# Patient Record
Sex: Male | Born: 1945
Health system: Southern US, Community
[De-identification: ages and names within clinical notes are randomized; demographics above are authoritative.]

## PROBLEM LIST (undated history)

## (undated) DIAGNOSIS — K219 Gastro-esophageal reflux disease without esophagitis: Secondary | ICD-10-CM

## (undated) DIAGNOSIS — K5792 Diverticulitis of intestine, part unspecified, without perforation or abscess without bleeding: Secondary | ICD-10-CM

## (undated) DIAGNOSIS — Z974 Presence of external hearing-aid: Secondary | ICD-10-CM

## (undated) DIAGNOSIS — Z87442 Personal history of urinary calculi: Secondary | ICD-10-CM

## (undated) DIAGNOSIS — N200 Calculus of kidney: Secondary | ICD-10-CM

## (undated) DIAGNOSIS — S42009A Fracture of unspecified part of unspecified clavicle, initial encounter for closed fracture: Secondary | ICD-10-CM

## (undated) DIAGNOSIS — R319 Hematuria, unspecified: Secondary | ICD-10-CM

## (undated) DIAGNOSIS — H9190 Unspecified hearing loss, unspecified ear: Secondary | ICD-10-CM

## (undated) DIAGNOSIS — M1990 Unspecified osteoarthritis, unspecified site: Secondary | ICD-10-CM

## (undated) DIAGNOSIS — IMO0002 Reserved for concepts with insufficient information to code with codable children: Secondary | ICD-10-CM

## (undated) HISTORY — PX: LUMBAR SPINE SURGERY: SHX701

## (undated) HISTORY — PX: EXTRACORPOREAL SHOCK WAVE LITHOTRIPSY: SHX1557

## (undated) HISTORY — PX: ROTATOR CUFF REPAIR: SHX139

## (undated) HISTORY — PX: CARPAL TUNNEL RELEASE: SHX101

---
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
## 1968-03-17 HISTORY — PX: EXPLORATORY LAPAROTOMY: SUR591

## 1981-03-17 HISTORY — PX: CARPAL TUNNEL RELEASE: SHX101

## 1982-03-17 HISTORY — PX: CARPAL TUNNEL RELEASE: SHX101

## 1984-03-17 HISTORY — PX: LITHOTRIPSY: SUR834

## 1986-03-17 HISTORY — PX: ANKLE SURGERY: SHX546

## 1993-03-17 HISTORY — PX: BACK SURGERY: SHX140

## 1995-03-18 HISTORY — PX: ROTATOR CUFF REPAIR: SHX139

## 1997-09-13 ENCOUNTER — Ambulatory Visit (HOSPITAL_COMMUNITY): Admission: RE | Admit: 1997-09-13 | Discharge: 1997-09-13 | Payer: Self-pay | Admitting: Internal Medicine

## 1999-03-18 HISTORY — PX: LITHOTRIPSY: SUR834

## 2000-03-17 DIAGNOSIS — Z8719 Personal history of other diseases of the digestive system: Secondary | ICD-10-CM

## 2000-03-17 HISTORY — DX: Personal history of other diseases of the digestive system: Z87.19

## 2000-03-17 HISTORY — PX: OTHER SURGICAL HISTORY: SHX169

## 2000-09-15 ENCOUNTER — Encounter: Payer: Self-pay | Admitting: Emergency Medicine

## 2000-09-15 ENCOUNTER — Inpatient Hospital Stay (HOSPITAL_COMMUNITY): Admission: EM | Admit: 2000-09-15 | Discharge: 2000-09-19 | Payer: Self-pay | Admitting: Emergency Medicine

## 2000-09-18 ENCOUNTER — Encounter: Payer: Self-pay | Admitting: Internal Medicine

## 2000-10-09 ENCOUNTER — Encounter: Admission: RE | Admit: 2000-10-09 | Discharge: 2000-10-09 | Payer: Self-pay | Admitting: Surgery

## 2000-10-09 ENCOUNTER — Encounter: Payer: Self-pay | Admitting: Surgery

## 2000-11-11 ENCOUNTER — Encounter: Payer: Self-pay | Admitting: Urology

## 2000-11-12 ENCOUNTER — Ambulatory Visit (HOSPITAL_COMMUNITY): Admission: RE | Admit: 2000-11-12 | Discharge: 2000-11-12 | Payer: Self-pay | Admitting: Urology

## 2000-11-12 ENCOUNTER — Encounter: Payer: Self-pay | Admitting: Urology

## 2001-10-01 ENCOUNTER — Encounter: Admission: RE | Admit: 2001-10-01 | Discharge: 2001-10-01 | Payer: Self-pay | Admitting: *Deleted

## 2001-10-01 ENCOUNTER — Encounter: Payer: Self-pay | Admitting: *Deleted

## 2001-10-07 ENCOUNTER — Encounter: Payer: Self-pay | Admitting: *Deleted

## 2001-10-07 ENCOUNTER — Encounter: Admission: RE | Admit: 2001-10-07 | Discharge: 2001-10-07 | Payer: Self-pay | Admitting: *Deleted

## 2001-11-18 ENCOUNTER — Ambulatory Visit (HOSPITAL_BASED_OUTPATIENT_CLINIC_OR_DEPARTMENT_OTHER): Admission: RE | Admit: 2001-11-18 | Discharge: 2001-11-18 | Payer: Self-pay | Admitting: Urology

## 2001-11-18 ENCOUNTER — Encounter: Payer: Self-pay | Admitting: Urology

## 2002-11-12 ENCOUNTER — Ambulatory Visit: Admission: RE | Admit: 2002-11-12 | Discharge: 2002-11-12 | Payer: Self-pay

## 2003-03-18 HISTORY — PX: LITHOTRIPSY: SUR834

## 2003-06-29 ENCOUNTER — Ambulatory Visit (HOSPITAL_COMMUNITY): Admission: RE | Admit: 2003-06-29 | Discharge: 2003-06-29 | Payer: Self-pay | Admitting: Gastroenterology

## 2004-07-08 ENCOUNTER — Encounter: Admission: RE | Admit: 2004-07-08 | Discharge: 2004-07-08 | Payer: Self-pay | Admitting: Family Medicine

## 2004-07-11 ENCOUNTER — Ambulatory Visit (HOSPITAL_COMMUNITY): Admission: RE | Admit: 2004-07-11 | Discharge: 2004-07-11 | Payer: Self-pay | Admitting: *Deleted

## 2004-07-15 ENCOUNTER — Ambulatory Visit (HOSPITAL_COMMUNITY): Admission: RE | Admit: 2004-07-15 | Discharge: 2004-07-15 | Payer: Self-pay | Admitting: *Deleted

## 2004-07-17 ENCOUNTER — Ambulatory Visit (HOSPITAL_COMMUNITY): Admission: RE | Admit: 2004-07-17 | Discharge: 2004-07-17 | Payer: Self-pay | Admitting: *Deleted

## 2004-07-18 ENCOUNTER — Ambulatory Visit (HOSPITAL_COMMUNITY): Admission: RE | Admit: 2004-07-18 | Discharge: 2004-07-18 | Payer: Self-pay | Admitting: *Deleted

## 2004-07-22 ENCOUNTER — Ambulatory Visit (HOSPITAL_COMMUNITY): Admission: RE | Admit: 2004-07-22 | Discharge: 2004-07-22 | Payer: Self-pay | Admitting: *Deleted

## 2004-07-23 ENCOUNTER — Ambulatory Visit: Payer: Self-pay | Admitting: Internal Medicine

## 2004-08-21 ENCOUNTER — Ambulatory Visit: Payer: Self-pay | Admitting: Internal Medicine

## 2005-02-04 ENCOUNTER — Encounter: Admission: RE | Admit: 2005-02-04 | Discharge: 2005-02-04 | Payer: Self-pay | Admitting: Orthopedic Surgery

## 2006-03-17 HISTORY — PX: ANKLE SURGERY: SHX546

## 2006-03-17 HISTORY — PX: LITHOTRIPSY: SUR834

## 2006-10-15 ENCOUNTER — Ambulatory Visit (HOSPITAL_COMMUNITY): Admission: RE | Admit: 2006-10-15 | Discharge: 2006-10-15 | Payer: Self-pay | Admitting: Urology

## 2007-01-05 ENCOUNTER — Ambulatory Visit: Payer: Self-pay | Admitting: Critical Care Medicine

## 2007-02-02 ENCOUNTER — Ambulatory Visit (HOSPITAL_COMMUNITY): Admission: RE | Admit: 2007-02-02 | Discharge: 2007-02-02 | Payer: Self-pay | Admitting: Orthopedic Surgery

## 2007-03-05 ENCOUNTER — Ambulatory Visit: Payer: Self-pay | Admitting: Internal Medicine

## 2007-03-09 ENCOUNTER — Encounter: Payer: Self-pay | Admitting: Internal Medicine

## 2007-03-09 ENCOUNTER — Ambulatory Visit: Payer: Self-pay

## 2007-03-15 ENCOUNTER — Ambulatory Visit: Payer: Self-pay | Admitting: Internal Medicine

## 2007-03-15 ENCOUNTER — Ambulatory Visit (HOSPITAL_COMMUNITY): Admission: RE | Admit: 2007-03-15 | Discharge: 2007-03-15 | Payer: Self-pay | Admitting: Internal Medicine

## 2008-07-21 ENCOUNTER — Encounter: Admission: RE | Admit: 2008-07-21 | Discharge: 2008-07-21 | Payer: Self-pay | Admitting: Gastroenterology

## 2008-08-28 ENCOUNTER — Encounter: Admission: RE | Admit: 2008-08-28 | Discharge: 2008-08-28 | Payer: Self-pay | Admitting: Orthopedic Surgery

## 2008-08-31 ENCOUNTER — Encounter: Admission: RE | Admit: 2008-08-31 | Discharge: 2008-08-31 | Payer: Self-pay | Admitting: Orthopedic Surgery

## 2009-03-17 HISTORY — PX: OTHER SURGICAL HISTORY: SHX169

## 2010-01-03 ENCOUNTER — Encounter: Admission: RE | Admit: 2010-01-03 | Discharge: 2010-01-03 | Payer: Self-pay | Admitting: Gastroenterology

## 2010-07-30 NOTE — Assessment & Plan Note (Signed)
[facility]                             PULMONARY OFFICE NOTE   NAME:Andrew Moran, Andrew Moran                     MRN:          [id number]  DATE:01/05/2007                            DOB:          [birth year]    The patient is a 65 year old nonsmoker who presents for an evaluation  for asthma.  He reports asthma since childhood, but seemed to disappear  after the age of 2 and then resurfaced around the age of 44.  About 10  years ago, he developed severe reflux with worsening of his asthma  symptoms.  He saw Casimiro Needle B. Wert, MD, FCCP two years ago and was placed  on Advair 100/50 mg b.i.d. and Prevacid because it was felt that GERD  was contributing to his asthma.  He has been lost to follow-up since  then and states that he was doing well on low dose Advair and Prevacid.  Singulair was discontinued.  About two months ago, he developed sinus  infection and was treated with Septra.  Since then he reports increasing  use of his albuterol in spite of being on the same dose of Advair 100/50  one puff b.i.d.   He reports GERD for about 20 years requiring Tagamet, worsening with  sleep.  Over the last few years, he has been on Prevacid and does not  report any breakthrough symptoms on this.   PAST MEDICAL HISTORY:  Chronic back pain, renal calculi.   CURRENT MEDICATIONS:  1. Aspirin 81 mg daily.  2. Advair 100/50 one puff b.i.d.  3. Prevacid 20 mg daily.  4. Vytorin.  5. Hydrochlorothiazide 25 mg daily.  6. Potassium 40 mEq daily.  7. Glucosamine.  8. Albuterol MDI p.r.n.  9. Methocarbamol 500 mg daily.  10.Hydrocodone 10/650 mg daily.  11.Robitussin p.r.n.  12.NyQuil as needed.   PHYSICAL EXAMINATION:  VITAL SIGNS:  Weight 180 pounds, blood pressure  104/70, heart rate 68, oxygen saturation 96% on room air.  HEENT:  No postnasal drip.  CARDIOVASCULAR:  S1 and S2 normal.  CHEST:  Clear to auscultation.   ASSESSMENT:  1. Moderate persistent asthma.  2. Gastroesophageal reflux disease.  3. Recent attack of sinusitis.   His predominant symptom seems to cough.  I wonder if his sinusitis was  exacerbating this.  Certainly GERD, especially in non-acid reflux, could  also be a cause.   RECOMMENDATIONS:  1. I suggest that we undertake an empiric trial of Deconamine      sustained release one tablet b.i.d. (first generation antihistamine      and decongestant combination).  This would target sinusitis and      would decrease his cough if that were the cause.  2. I have given him a sample of Advair and we will increase his dose      to 250/50 one puff b.i.d.  3. He will return for a follow-up in about four weeks' time.  If his      symptoms persist, we will target GERD and intensify therapy for      GERD including non-acid reflux with Reglan.  Oretha Milch, MD  Electronically Signed    RVA/MedQ  DD: 01/05/2007  DT: 01/06/2007  Job #: [id number]

## 2010-07-30 NOTE — Assessment & Plan Note (Signed)
[facility]                            CARDIOLOGY OFFICE NOTE   NAME:Andrew Moran, Andrew Moran                     MRN:          [id number]  DATE:03/05/2007                            DOB:          10-02-1945    PRIMARY CARE PHYSICIAN:  L. Lupe Carney, M.D.   REASON FOR CONSULTATION:  Chest pain and dyspnea.   Mr. Fulop is a very pleasant 65 year old male with a history of  asthma, gastroesophageal reflux disease, kidney stones, hyperlipidemia,  and multiple orthopedic issues, who presents today for further  evaluation of chest pain and dyspnea.   He was having problems with chest pain back in 2006 and underwent  cardiac catheterization by Dr. Meade Maw.  This showed normal LV  function with just minimal coronary plaquing with a 20-30% LAD lesion  and a 15-20% right coronary lesion.   He tells me that he has chronic dyspnea, which perhaps is just slightly  worse recently.  He does have occasional wheezing, but this is not  frequent.  He has never been intubated for his asthma.  He also has  occasional chest pain, which he describes as a chest pressure.  This  happens about once a week.  This has been going on for a long time.  It  mainly occurs at night.  He does not have any exertional chest pain.  He  does get dyspneic with walking.  He has not had any heart failure  symptoms, no orthopnea, no PND, no lower extremity edema.   REVIEW OF SYSTEMS:  Notable for multiple allergies, gastroesophageal  reflux disease, arthritic pain, as well as orthopedic problems.  No  melena.  No bright red blood per rectum.  Occasional headaches.  The  remainder of the review of systems is negative except for HPI and  problem list.   PROBLEM LIST:  1. History of chest pain.      a.     Cardiac catheterization in April, 2006.  Minimal       nonobstructive coronary artery disease, normal LV function.  2. Asthma.  3. Hyperlipidemia.  4. Kidney stones.  5.  Gastroesophageal reflux disease.  6. Multiple orthopedic surgeries.   CURRENT MEDICATIONS:  1. Aspirin 81 a day.  2. Advair.  3. Multivitamin.  4. Vitamin E.  5. Vytorin.  6. Prevacid 30 a day.  7. HCTZ 25 a day.  8. Potassium.  9. Glucosamine.  10.Flax seed oil.  11.Saw palmetto.   ALLERGIES:  ZITHROMAX, which causes shortness of breath.   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  He denies any tobacco use.  Occasional alcohol.   FAMILY HISTORY:  Mother is alive at age 32.  She is well.  Father had  his first heart attack at 74 and had a fatal heart attack at 64.  One  sister, 84, and one brother, 38, do not have known coronary disease.   PHYSICAL EXAMINATION:  He is well-appearing in no acute distress.  He  ambulates around the clinic with a walker due to a plastic  boot on his  right lower leg.  No respiratory difficulty.  Blood pressure is 106/68, heart rate 68.  HEENT:  Normal.  NECK:  Supple.  No JVD.  Carotids are 2+ bilaterally without any bruits.  There is no lymphadenopathy or thyromegaly.  CARDIAC:  PMI is nondisplaced.  There is no lift or heave.  He has a  regular rate and rhythm.  No murmurs, rubs or gallops.  LUNGS:  Clear with no wheezing.  Mildly decreased air movement  throughout, though.  ABDOMEN:  Obese, nontender, nondistended.  No hepatosplenomegaly.  No  bruits.  No masses.  Good bowel sounds.  EXTREMITIES:  Warm with no clubbing, cyanosis or edema.  Good distal  pulses.  He has a boot on his right lower extremity due to recent ankle  surgery.  He has multiple orthopedic scars.  NEURO:  He is alert and oriented x3.  Cranial nerves II-XII are intact.  Moves all four extremities without difficulty.  Affect is very pleasant.   EKG shows a normal sinus rhythm at a rate of 85 with no ST-T wave  abnormalities.   ASSESSMENT/PLAN:  1. Chest pain:  Given his normal catheterization two years ago, I      think that the  chance of his chest pain being ischemic is quite      low, despite his strong family history.  We will proceed with a      cardiopulmonary exercise test to help rule out ischemia as well as      to further evaluate the cause of his dyspnea.  2. Dyspnea:  I suspect this is multifactorial.  I did discuss with him      that I felt a large part of this may be deconditioning, as he is      very sedentary; however, I think CPX testing will help [place] sort this      out somewhat better.   DISPOSITION:  We will see him back in 1-2 months.  We will let him know  the results of his test.  Of note, we will also order an echocardiogram  to make sure he has a structurally normal heart with normal pulmonary  pressures.     Bevelyn Buckles. Bensimhon, MD  Electronically Signed    DRB/MedQ  DD: 03/05/2007  DT: 03/05/2007  Job #: [id number]   cc:   Kerry Kass, M.D. [facility]  L. Lupe Carney, M.D.

## 2010-07-30 NOTE — Op Note (Signed)
NAMENEDIM, OKI NO.:  [id number]   MEDICAL RECORD NO.:  [id number]          PATIENT TYPE:  AMB   LOCATION:  DAY                          FACILITY:  [facility]   PHYSICIAN:  Marlowe Kays, M.D.  DATE OF BIRTH:  06/01/1945   DATE OF PROCEDURE:  02/02/2007  DATE OF DISCHARGE:                               OPERATIVE REPORT   PREOPERATIVE DIAGNOSIS:  Painful right ankle due to two retained screws  and reactive osteophytes.   POSTOPERATIVE DIAGNOSIS:  Painful right ankle due to retained screws.   SURGEON:  Marlowe Kays, M.D.   ASSISTANT:  Nurse.   OPERATION:  Removal of two screws from anterior medial ankle with ankle  arthrotomy and inspection of joint.   ANESTHESIA:  General.   PATHOLOGY AND JUSTIFICATION FOR PROCEDURE:  He has had progressive pain  and feeling of giving way of the right ankle recently with x-rays  demonstrating two screws in the medial malleolus and anterior medial  ankle with what appeared to be reactive osteophytes, particularly from  the distal tibia, impinging into the talus.  For this reason, he is here  today for removal of the hardware and osteophytes.   PROCEDURE:  Satisfactory general anesthesia, pneumatic tourniquet, the  right leg was esmarched out nonsterilely and prepped with DuraPrep from  midcalf to toes and draped in a sterile field.  Time-out performed.  I  made an anterior medial incision and using the C-arm was able to locate  the two screws, which I removed.  The anterior medial one at the joint  surface, did have some bleeding and I plugged the hole with bone wax.  I  then continued with an ankle arthrotomy and inspected the joint and  could not find the osteophytes that were noted on x-ray.  The joint  surfaces looked smooth and there was no impingement on dorsiflexion of  the ankle.  I used the C-arm to confirm both with lateral flexion-  extension x-rays that the osteophytes that appeared to be apparent with  the screws in place were not present after removal of the screws.  Accordingly no intra-articular bone work was indicated.  I irrigated the  wound well with sterile saline, closed the joint capsule with  interrupted #1 Vicryl as well as the more posterior screw hole.  Subcutaneous tissue was closed with 2-0 Vicryl, skin with interrupted 4-  0 nylon mattress sutures.  Betadine, Adaptic dry sterile dressing and a  well-padded short-leg splint cast was applied.  He tolerated the  procedure well and was taken to the recovery room in satisfactory  condition with no known complications.           ______________________________  Marlowe Kays, M.D.     JA/MEDQ  D:  02/02/2007  T:  02/02/2007  Job:  [id number]

## 2010-08-02 NOTE — Op Note (Signed)
NAME:  Andrew Moran, Andrew Moran                        ACCOUNT NO.:  [id number]   MEDICAL RECORD NO.:  [id number]                   PATIENT TYPE:  AMB   LOCATION:  ENDO                                 FACILITY:  [facility]   PHYSICIAN:  James L. Malon Kindle., M.D.          DATE OF BIRTH:  1945-08-28   DATE OF PROCEDURE:  06/29/2003  DATE OF DISCHARGE:                                 OPERATIVE REPORT   PROCEDURE:  Esophagogastroduodenoscopy.   MEDICATIONS GIVEN:  Cetacaine spray, Fentanyl 60 mcg, Versed 6 mg IV.   INDICATIONS FOR PROCEDURE:  Persistent esophageal reflux.   DESCRIPTION OF PROCEDURE:  The procedure was explained to the patient and  consent obtained.  With the patient in the left lateral decubitus position,  the Olympus scope was inserted and advanced.  The stomach was entered, the  pylorus identified and passed.  The duodenum including the bulb and second  portion, were unremarkable.  The scope was withdrawn.  The fundus and cardia  were seen and were normal as was the antrum and body of the stomach.  There  was no outlet obstruction.  There was a 2-3 cm hiatal hernia.  The GE  junction was widely patent.  There was no evidence of Barrett's esophagus.  The distal esophagus was endoscopically normal.  The scope was withdrawn.  The patient tolerated the procedure well.   ASSESSMENT:  Gastroesophageal reflux disease with a widely patent GE  junction, no evidence of Barrett's esophagus or gastric outlet obstruction,  530.81.   PLAN:  Will continue b.i.d. PPI, him give her a trial of Reglan, reflux  sheet, and see him back in the office in 6-8 weeks.                                               James L. Malon Kindle., M.D.    Waldron Session  D:  06/29/2003  T:  06/29/2003  Job:  [id number]   cc:   Domingo Pulse, M.D.

## 2010-08-02 NOTE — Discharge Summary (Signed)
[facility]  Patient:    Andrew Moran, Andrew Moran                     MRN: [id number] Adm. Date:  [id number] Disc. Date: [id number] Attending:  Duke Salvia                           Discharge Summary  DISCHARGE DIAGNOSES: 1. Diverticulitis. 2. Transient hypokalemia. 3. Mild anemia. 4. Allergies. 5. Lumbosacral spine degenerative disc disease.  PROCEDURES:  Abdominal CT x 2 with sigmoid diverticulitis, question a very tiny abscess, but more likely not and no perforation.  CONSULTS:  Dr. Jamey Ripa, general surgeon.  HISTORY AND PHYSICAL:  Please see the dictated date of admission, July 2, per Dr. Debby Bud.  HOSPITAL COURSE:  Andrew Moran is a __-year-old white male who was admitted with stabbing abdominal and rectal pain, admitted for diverticulitis based on examination and CAT scan for antibiotics and IV fluids. He was seen by Dr. Jamey Ripa who agreed with current management, clear liquid diet at that point. He was initially febrile and treated with IV Unasyn with the addition of Flagyl IV thereafter. White blood cell count was elevated to 15.7 and gradually defervesced ______ his fever. Clinically, he did well with conservative therapy with gradual decrease abdominal pain, returned to more normal bowel function, no nausea or vomiting, afebrile. White blood cell count normalized as well as nausea. There was no vomiting throughout. Followup CAT scan revealed no significant change by radiologic criteria from previous but he was clearly clinically improved. There was some minor drop in the hemoglobin to 11.1 but discharge hemoglobin was normalized at 12.3. White blood cell count 5.4. There was some transient hypokalemia but normal BMET at time of discharge as well. As he had minimal discomfort, ambulatory, eating well, and relatively normal bowel function, minimal left lower quadrant tenderness, normalized white blood cell count and afebrile, he was felt  to have gained maximum benefit from this hospitalization and is to be discharged.  DISPOSITION:  Discharge to home in good condition.  DISCHARGE MEDICATIONS: 1. Augmentin 875 mg x 10 days postdischarge. 2. Flagyl 250 mg p.o. q.i.d. for 10 days. 3. Continue own home regimen of Allegra, albuterol metered-dose inhaler, and    Pepcid.  DISCHARGE FOLLOWUP:  The patient is to follow up with Dr. Jamey Ripa in two weeks and Dr. Debby Bud on a p.r.n. basis. DD:  09/19/00 TD:  09/19/00 Job: [id number]

## 2010-08-02 NOTE — H&P (Signed)
[facility]  Patient:    Andrew Moran, Andrew Moran                     MRN: [id number] Adm. Date:  [id number] Attending:  Duke Salvia CC:         Gordy Savers, M.D. [facility]  Dr. Ethelene Hal; [facility]   History and Physical  CHIEF COMPLAINT:  Stabbing abdominal and rectal pain.  HISTORY OF PRESENT ILLNESS:  Mr. Andrew Moran is a 65 year old married white male with a history of dyspepsia, but no GI history otherwise.  He reports a two week history of abdominal pain that was brief in duration.  It would be sharp and lancinating in nature with sharp lancinating rectal pain as well.  The patient reports that this a.m. he had the onset of excrutiating knife life pain at the abdomen and rectum and lower back.  Patient reports he has been feeling hot at home, but no documented fever, no rigors, no night sweats.  He reports no change in his bowel habit except for possibly slightly darker stool.  He has had no diarrhea.  Of note, the patient has had epidural injection and disk injections within the last two weeks for a herniated nucleus pulposus which resulted in a right lower extremity radiculopathy.  The patient is now admitted for diverticulitis based on examination and CT scan for IV antibiotics.  PAST SURGICAL HISTORY:  Tonsillectomy, remote.  Patient had a repair of a ventral hernia in the 70s.  He had an operative repair, internal fixation of a right ankle fracture.  He has had rotator cuff repaired in both right and left shoulders.  He has had carpal tunnel repair in both right and left wrists.  He is status post L4-5 surgery in 1995.  Patient has had no other surgical history.  PAST MEDICAL HISTORY:  Usual childhood diseases, childhood asthma with long remission, ______ as an adult, allergies, herniated disk problem at L5-S1.  MEDICATIONS: 1. Allegra 60 mg b.i.d. 2. Albuterol metered dose inhaler two puffs q.i.d. p.r.n.  SOCIAL HISTORY:   Habits:  Tobacco:  None.  Alcohol:  Rare.  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 He also is a Furniture conservator/restorer.  He has been married for 37 years.  He has one son age 43 and two grandchildren.  His marriage is in good health.  ALLERGIES:  ZITHROMAX caused angioedema.  FAMILY HISTORY:  Negative for colon cancer, positive for coronary artery disease in his father who died at age 30, negative for diabetes or prostate cancer.  REVIEW OF SYSTEMS:  Patient is having a crown done.  He has dyspepsia as noted.  No pulmonary problems except as noted.  No cardiovascular problems. He does admit to mild pain in both thumbs suggestive of early OA.  PHYSICAL EXAMINATION  VITAL SIGNS:  Temperature 100.4 and patient is hot to touch, blood pressure 132/65, heart rate 75, respirations 15.  GENERAL:  This is a well-nourished, well-developed Caucasian male who is in no acute distress.  HEENT:  Normocephalic, atraumatic.  He has cerumen in both EACs but TMs were normal.  Oropharynx with native dentition in good repair.  No buccal or palatal lesions were noted.  Posterior pharynx was clear.  Conjunctivae and sclerae were clear.  Pupils are equal, round and reactive to light and accommodation.  NECK:  Supple.  No thyromegaly.  NODES:  No adenopathy was noted in the cervical,  supraclavicular regions.  CHEST:  No CVA tenderness.  Lungs were clear to auscultation and percussion.  CARDIOVASCULAR:  Radial pulse 2+.  No JVD or carotid bruits.  He had a quiet epicardium with a regular rate and rhythm without murmurs, rubs, or gallops.  ABDOMEN:  Patient had positive bowel sounds in all four quadrants, though hypoactive.  Patient had mild tenderness to percussion.  He had exquisite tenderness to deep palpation of the left lower quadrant and suprapubic region along with guarding.  He has no rebound.  RECTAL:  Per EDP with a normal prostate.  There was no  tenderness on examination.  Stool was heme negative.  EXTREMITIES:  Without cyanosis, clubbing, or edema.  He has a scar at his medial right ankle from ORIF.  NEUROLOGIC:  Patient is alert and oriented to person, place, time, and context.  His examination is nonfocal with good range of motion of all of his limbs.  LABORATORIES:  CT scan of the pelvis and abdomen revealed marked thickening of his sigmoid colon wall with fat stranding consistent with diverticulitis.  No frank abscesses noted.  CBC, CMET, and blood cultures were pending.  ASSESSMENT AND PLAN:  Diverticulitis.  Based on patients history, examination, and CT scan picture is consistent with acute diverticulitis. There is no evidence of colonic perforation.  There is no evidence of back or spinal involvement.  Unasyn 1 g intravenous q.6h.  Will obtain a followup CT of the pelvis in 72 hours.  I have called and left a message at Dr. Weldon Inches office, more for information in case we need surgical consultation.  If the patient has significant back pain or develops recurrent radiculopathy he would be a candidate for MRI of the spine to rule out paraspinal abscess. DD:  09/15/00 TD:  09/15/00 Job: [id number]

## 2010-08-02 NOTE — H&P (Signed)
Andrew Moran, PATE NO.:  [id number]   MEDICAL RECORD NO.:  [id number]          PATIENT TYPE:  OIB   LOCATION:  2899                         FACILITY:  [facility]   PHYSICIAN:  Meade Maw, M.D.    DATE OF BIRTH:  Jan 30, 1946   DATE OF ADMISSION:  07/11/2004  DATE OF DISCHARGE:                                HISTORY & PHYSICAL   REFERRING PHYSICIAN:  L. Lupe Carney, M.D.   INDICATIONS FOR PROCEDURE:  A 65 year old gentleman with ongoing chest  pressure and shortness of breath, which has become more progressive in  nature.   DESCRIPTION OF PROCEDURE:  After obtaining written informed consent, the  patient was brought to the cardiac catheterization lab in the postabsorptive  state.  Preoperative sedation was achieved using Versed 2 mg IV.  The right  groin was prepped and draped in the usual sterile fashion.  Local anesthesia  was achieved using 1% Xylocaine.  A 6-French hemostasis sheath was placed  into the right femoral artery, using the modified Seldinger technique.  Selective coronary angiography was performed using a JL-4, JR-4 Judkins  catheters.  Multiple views were obtained.  All catheter exchanges were made  over a guide wire.  Single-plane ventriculogram was performed in the RAO  position using a 6-French pigtail curved catheter.  Coronary angiography was  performed with a JL4 and JR4 Judkins catheters. Multiple views were  obtained.  All catheter exchanges were made over a guide wire.  Hemostasis  sheath was placed following each catheter engagement.  There was no  immediate disease.  The patient was transferred to the holding area.  Hemostasis sheath was removed.  Hemostasis was achieved using digital  pressure.  There was no immediate complications noted.   FINDINGS:  1.  Aortic pressure:  122/76.  2.  Left ventricular pressure:  120/4 with an EDP of 9.   VENTRICULOGRAM:  Single-plane ventriculogram revealed normal wall motion.  Ejection  fraction 65%.  There was post PVC MR .   CORONARY ANGIOGRAPHY:  1.  LEFT MAIN:  Bifurcates into the left anterior descending and circumflex      vessel.  There is no disease noted in the left main coronary artery.  2.  LEFT ANTERIOR DESCENDING:  Gives rise to a large D1, small D2, moderate      D3; goes on in as an apical recurrent branch.  There is a 20-30%      nonobstructive lesion following the first diagonal.  3.  CIRCUMFLEX VESSEL:  Moderate in caliber.  It gives rise to a large OM1,      large bifurcating posterolateral branch, and ends as an AV groove      vessel.  There is no disease noted in the circumflex or its branches.  4.  RIGHT CORONARY ARTERY:  Gives rise to trivial RV marginals, PDA and a PL      branch.  There is approximately a 10-15% proximal lesion in the right      coronary artery.   FINAL IMPRESSION:  1.  Noncritical disease in the proximal left anterior descending.  2.  Normal circumflex.  3.  Luminal irregularities in the right coronary artery only.  4.  Normal single-plane ventriculogram.  5.  Normal diastolic function.  6.  Other etiology for his dyspnea should be considered.  Aggressive      treatment for his asthma and chronic obstructive pulmonary disease.  The      patient should be treated for secondary risk reduction. His LDL goal      should be less than 100.  He should continue with aspirin daily.      HP/MEDQ  D:  07/11/2004  T:  07/11/2004  Job:  [id number]

## 2010-08-02 NOTE — Consult Note (Signed)
[facility]  Patient:    Andrew Moran, Andrew Moran                     MRN: [id number] Adm. Date:  [id number] Attending:  Duke Salvia CC:         Rosalyn Gess. Norins, M.D. [facility]   Consultation Report  REASON FOR CONSULTATION:  Acute diverticulitis.  CLINICAL HISTORY:  Mr. Andrew Moran is a 65 year old gentleman who was admitted yesterday to Dr. Illene Regulus service with acute diverticulitis.  Patient reports that one month ago while on a cruise he had an episode of severe knife like pain which was suprapubic, left upper quadrant.  Felt like up into his rectum as well.  It was not associated with nausea, vomiting, fever, or chills.  It only lasted about 15 minutes and resolved. Approximately two weeks ago he had a second episode of almost identical pain and of similar duration.  Yesterday he developed his third episode and unfortunately this one did not resolve in 15 minutes, but had persisted.  It was not really associated with any further nausea or vomiting.  He had not had fever or chills as well, nor any change in his bowel habits, diarrhea, etcetera.  He was being evaluated for some back injections for back pain yesterday and the physician that saw him recommended that he come to the emergency room.  He was evaluated here and had a CT scan which showed findings consistent with sigmoid colon thickening and stranding consistent with diverticulitis.  No abscess was seen.  Incidentally noted were some kidney stones and he has had a history of kidney stones in the past.  MEDICATIONS:  Allegra, albuterol, Pepcid.  ALLERGIES:  ZITHROMAX.  PAST SURGICAL HISTORY:  Epigastric hernia fixed in 1970.  He has also had surgery for ankle fracture, rotator cuff, carpal tunnel repairs, and L4-5 surgery.  SOCIAL HISTORY:  Patient is a nonsmoker and nondrinker.  FAMILY HISTORY:  Basically negative for cancer, but has history of coronary artery disease, father having  died at age 61.  REVIEW OF SYSTEMS:  Vaguely negative except his back pain as noted previously and a history of kidney stones.  PHYSICAL EXAMINATION  GENERAL:  The patient is alert, awake, in no distress.  Feels comfortable and says he is improved from admission.  HEENT:  Head is normocephalic.  Eyes:  Non-icteric.  Pupils are round and regular.  NECK:  Supple.  No masses or thyromegaly.  LUNGS:  Clear to auscultation.  HEART:  Regular.  No murmurs, rubs, or gallops.  ABDOMEN:  Soft in the upper abdomen but he has marked bilateral lower quadrant tenderness, left more than the right.  No rebound was present.  RECTAL:  Not done.  Reportedly negative by the EDP.  EXTREMITIES:  No cyanosis or edema.  LABORATORIES:  White count 15,000.  IMPRESSION: 1. Acute diverticulitis without abscess. 2. History of kidney stones.  RECOMMENDATION:  Agree with current management which includes IV antibiotic therapy and clear liquid diet.  We will follow him and hopefully this will resolve with non-operative management.  I have discussed the situation with him, these recommendations, the fact that he might end up needing surgical intervention and the fact that should it have to be done acutely he might need a colostomy. DD:  09/16/00 TD:  09/16/00 Job: [id number]

## 2010-12-24 LAB — BASIC METABOLIC PANEL
BUN: 10
CO2: 26
Calcium: 9.2
Chloride: 105
Creatinine, Ser: 0.87
GFR calc Af Amer: >60
GFR calc non Af Amer: >60
Glucose, Bld: 107 — ABNORMAL HIGH
Potassium: 4.3
Sodium: 140

## 2010-12-24 LAB — HEMOGLOBIN AND HEMATOCRIT, BLOOD
HCT: 41
Hemoglobin: 14.2

## 2010-12-30 LAB — BASIC METABOLIC PANEL
BUN: 14
CO2: 26
Calcium: 8.9
Chloride: 104
Creatinine, Ser: 0.89
GFR calc Af Amer: >60
GFR calc non Af Amer: >60
Glucose, Bld: 94
Potassium: 3.9
Sodium: 138

## 2010-12-30 LAB — CBC
HCT: 39.8
Hemoglobin: 14.1
MCHC: 35.4
MCV: 87.4
Platelets: 318
RBC: 4.55
RDW: 13.5
WBC: 8.2

## 2010-12-30 LAB — URINALYSIS, ROUTINE W REFLEX MICROSCOPIC
Bilirubin Urine: NEGATIVE
Glucose, UA: NEGATIVE
Hgb urine dipstick: NEGATIVE
Ketones, ur: NEGATIVE
Nitrite: NEGATIVE
Protein, ur: NEGATIVE
Specific Gravity, Urine: 1.017
Urobilinogen, UA: 0.2
pH: 6

## 2011-02-04 ENCOUNTER — Encounter (HOSPITAL_COMMUNITY)
Admission: RE | Admit: 2011-02-04 | Discharge: 2011-02-04 | Disposition: A | Discharge status: Home / Self Care | Payer: Medicare Other | Source: Ambulatory Visit | Attending: Orthopedic Surgery | Admitting: Orthopedic Surgery

## 2011-02-04 ENCOUNTER — Encounter (HOSPITAL_COMMUNITY): Payer: Self-pay

## 2011-02-04 ENCOUNTER — Encounter (HOSPITAL_COMMUNITY): Payer: Self-pay | Admitting: Pharmacy Technician

## 2011-02-04 HISTORY — DX: Gastro-esophageal reflux disease without esophagitis: K21.9

## 2011-02-04 HISTORY — DX: Diverticulitis of intestine, part unspecified, without perforation or abscess without bleeding: K57.92

## 2011-02-04 HISTORY — DX: Calculus of kidney: N20.0

## 2011-02-04 HISTORY — DX: Reserved for concepts with insufficient information to code with codable children: IMO0002

## 2011-02-04 HISTORY — DX: Fracture of unspecified part of unspecified clavicle, initial encounter for closed fracture: S42.009A

## 2011-02-04 LAB — SURGICAL PCR SCREEN: Staphylococcus aureus: POSITIVE — AB

## 2011-02-04 LAB — CBC
HCT: 40.4 % (ref 39.0–52.0)
MCV: 89.4 fL (ref 78.0–100.0)
Platelets: 264 10*3/uL (ref 150–400)
RBC: 4.52 MIL/uL (ref 4.22–5.81)
RDW: 12.8 % (ref 11.5–15.5)
WBC: 7.6 10*3/uL (ref 4.0–10.5)

## 2011-02-04 MED ORDER — CHLORHEXIDINE GLUCONATE 4 % EX LIQD: 60.0000 mL | Freq: Once | CUTANEOUS | Status: DC

## 2011-02-04 NOTE — Patient Instructions (Signed)
Andrew Moran  11/Andrew/2012 Your procedure is scheduled on: 02-05-11 Report to [facility] at 1200 AM.  Call this number if you have problems the morning of surgery: [phone]   Remember:   Do not eat food:AFTER MIDNIGHT TONIGHT.  Do not drink clear liquids: 6 Hours before arrival.CLEAR LIQUIDS MIDNIGHT TONIGHT UNTIL 0800 AM THEN NOTHING  Take these medicines the morning of surgery with A SIP OF WATER:ADVAIR, USE ALBUTEROL INHALER IF NEEDED AND BRING INHALER   Do not wear jewelry, make-up or nail polish.  Do not wear lotions, powders, or perfumes..  Do not shave 48 hours prior to surgery.  Do not bring valuables to the hospital.  Contacts, dentures or bridgework may not be worn into surgery.  Leave suitcase in the car. After surgery it may be brought to your room.  For patients admitted to the hospital, checkout time is 11:00 AM the day of discharge.   Patients discharged the day of surgery will not be allowed to drive home.  Name and phone number of your driver: WIFE ANN [phone] CELL  Special Instructions: CHG Shower Use Special Wash: 1/2 bottle night before surgery and 1/2 bottle morning of surgery., NECK DOWN AVOID PRIVATE AREA   Please read over the following fact sheets that you were given: MRSA Information

## 2011-02-05 ENCOUNTER — Ambulatory Visit (HOSPITAL_COMMUNITY): Payer: Medicare Other | Admitting: Anesthesiology

## 2011-02-05 ENCOUNTER — Encounter (HOSPITAL_COMMUNITY)
Admission: RE | Disposition: A | Discharge status: Home / Self Care | Payer: Self-pay | Source: Ambulatory Visit | Attending: Orthopedic Surgery

## 2011-02-05 ENCOUNTER — Encounter (HOSPITAL_COMMUNITY): Payer: Self-pay | Admitting: Anesthesiology

## 2011-02-05 ENCOUNTER — Encounter (HOSPITAL_COMMUNITY): Payer: Self-pay | Admitting: *Deleted

## 2011-02-05 ENCOUNTER — Ambulatory Visit (HOSPITAL_COMMUNITY)
Admission: RE | Admit: 2011-02-05 | Discharge: 2011-02-05 | Disposition: A | Discharge status: Home / Self Care | Payer: Medicare Other | Source: Ambulatory Visit | Attending: Orthopedic Surgery | Admitting: Orthopedic Surgery

## 2011-02-05 DIAGNOSIS — X58XXXA Exposure to other specified factors, initial encounter: Secondary | ICD-10-CM | POA: Insufficient documentation

## 2011-02-05 DIAGNOSIS — Z9889 Other specified postprocedural states: Secondary | ICD-10-CM

## 2011-02-05 DIAGNOSIS — M1710 Unilateral primary osteoarthritis, unspecified knee: Secondary | ICD-10-CM | POA: Insufficient documentation

## 2011-02-05 DIAGNOSIS — S83289A Other tear of lateral meniscus, current injury, unspecified knee, initial encounter: Secondary | ICD-10-CM | POA: Insufficient documentation

## 2011-02-05 DIAGNOSIS — Z01812 Encounter for preprocedural laboratory examination: Secondary | ICD-10-CM | POA: Insufficient documentation

## 2011-02-05 DIAGNOSIS — M25569 Pain in unspecified knee: Secondary | ICD-10-CM | POA: Insufficient documentation

## 2011-02-05 HISTORY — PX: KNEE ARTHROSCOPY: SHX127

## 2011-02-05 SURGERY — ARTHROSCOPY, KNEE
Anesthesia: General | Site: Knee | Laterality: Right | Wound class: Clean

## 2011-02-05 MED ORDER — PROMETHAZINE HCL 25 MG/ML IJ SOLN: 6.2500 mg | INTRAMUSCULAR | Status: DC | PRN

## 2011-02-05 MED ORDER — OXYCODONE-ACETAMINOPHEN 5-325 MG PO TABS: 1.0000 | ORAL_TABLET | ORAL | Status: DC | PRN

## 2011-02-05 MED ORDER — ONDANSETRON HCL 4 MG/2ML IJ SOLN: 4.0000 mg | Freq: Four times a day (QID) | INTRAMUSCULAR | Status: DC | PRN

## 2011-02-05 MED ORDER — LACTATED RINGERS IV SOLN
INTRAVENOUS | Status: DC | PRN
  Administered 2011-02-05: 14:00:00 via INTRAVENOUS

## 2011-02-05 MED ORDER — MORPHINE SULFATE 4 MG/ML IJ SOLN
INTRAMUSCULAR | Status: DC | PRN
  Administered 2011-02-05: 4 mg via INTRAMUSCULAR

## 2011-02-05 MED ORDER — MIDAZOLAM HCL 5 MG/5ML IJ SOLN
INTRAMUSCULAR | Status: DC | PRN
  Administered 2011-02-05: 2 mg via INTRAVENOUS

## 2011-02-05 MED ORDER — METHOCARBAMOL 500 MG PO TABS: 500.0000 mg | ORAL_TABLET | Freq: Three times a day (TID) | ORAL | Status: DC

## 2011-02-05 MED ORDER — OXYCODONE-ACETAMINOPHEN 7.5-325 MG PO TABS: 1.0000 | ORAL_TABLET | ORAL | Status: AC | PRN

## 2011-02-05 MED ORDER — MORPHINE SULFATE 4 MG/ML IJ SOLN
INTRAMUSCULAR | Status: AC
  Filled 2011-02-05: qty 1

## 2011-02-05 MED ORDER — EPINEPHRINE HCL 1 MG/ML IJ SOLN
INTRAMUSCULAR | Status: AC
  Filled 2011-02-05: qty 2

## 2011-02-05 MED ORDER — DEXTROSE 5 % IN LACTATED RINGERS IV BOLUS
1000.0000 mL | Freq: Once | INTRAVENOUS | Status: DC
  Administered 2011-02-05: 1000 mL via INTRAVENOUS

## 2011-02-05 MED ORDER — BUPIVACAINE-EPINEPHRINE (PF) 0.5% -1:200000 IJ SOLN
INTRAMUSCULAR | Status: AC
  Filled 2011-02-05: qty 10

## 2011-02-05 MED ORDER — METHOCARBAMOL 500 MG PO TABS: 500.0000 mg | ORAL_TABLET | Freq: Four times a day (QID) | ORAL | Status: AC

## 2011-02-05 MED ORDER — LACTATED RINGERS IV SOLN
INTRAVENOUS | Status: DC | PRN
  Administered 2011-02-05: 14:00:00

## 2011-02-05 MED ORDER — ACETAMINOPHEN 10 MG/ML IV SOLN
INTRAVENOUS | Status: DC | PRN
  Administered 2011-02-05: 1000 mg via INTRAVENOUS

## 2011-02-05 MED ORDER — LIDOCAINE HCL 1 % IJ SOLN
INTRAMUSCULAR | Status: DC | PRN
  Administered 2011-02-05: 70 mg via INTRADERMAL

## 2011-02-05 MED ORDER — BUPIVACAINE-EPINEPHRINE 0.5% -1:200000 IJ SOLN
INTRAMUSCULAR | Status: DC | PRN
  Administered 2011-02-05: 30 mL

## 2011-02-05 MED ORDER — LACTATED RINGERS IV SOLN
INTRAVENOUS | Status: DC
  Administered 2011-02-05: 1000 mL via INTRAVENOUS

## 2011-02-05 MED ORDER — MUPIROCIN 2 % EX OINT
TOPICAL_OINTMENT | CUTANEOUS | Status: AC
  Filled 2011-02-05: qty 22

## 2011-02-05 MED ORDER — FENTANYL CITRATE 0.05 MG/ML IJ SOLN
INTRAMUSCULAR | Status: AC
  Filled 2011-02-05: qty 2

## 2011-02-05 MED ORDER — ACETAMINOPHEN 10 MG/ML IV SOLN
INTRAVENOUS | Status: AC
  Filled 2011-02-05: qty 100

## 2011-02-05 MED ORDER — PROPOFOL 10 MG/ML IV EMUL
INTRAVENOUS | Status: DC | PRN
  Administered 2011-02-05: 100 mg via INTRAVENOUS
  Administered 2011-02-05: 150 mg via INTRAVENOUS
  Administered 2011-02-05: 50 mg via INTRAVENOUS

## 2011-02-05 MED ORDER — FENTANYL CITRATE 0.05 MG/ML IJ SOLN
25.0000 ug | INTRAMUSCULAR | Status: DC | PRN
  Administered 2011-02-05 (×4): 25 ug via INTRAVENOUS

## 2011-02-05 MED ORDER — ONDANSETRON HCL 4 MG/2ML IJ SOLN
INTRAMUSCULAR | Status: DC | PRN
  Administered 2011-02-05: 4 mg via INTRAVENOUS

## 2011-02-05 MED ORDER — FENTANYL CITRATE 0.05 MG/ML IJ SOLN
INTRAMUSCULAR | Status: DC | PRN
  Administered 2011-02-05 (×4): 50 ug via INTRAVENOUS

## 2011-02-05 MED ORDER — MUPIROCIN 2 % EX OINT
TOPICAL_OINTMENT | Freq: Two times a day (BID) | CUTANEOUS | Status: DC
  Administered 2011-02-05: 13:00:00 via NASAL

## 2011-02-05 SURGICAL SUPPLY — 29 items
BANDAGE ELASTIC 6 VELCRO ST LF (GAUZE/BANDAGES/DRESSINGS) ×2 IMPLANT
BLADE 4.2CUDA (BLADE) IMPLANT
BLADE CUDA SHAVER 3.5 (BLADE) ×2 IMPLANT
CLOTH BEACON ORANGE TIMEOUT ST (SAFETY) ×2 IMPLANT
CUFF TOURN SGL QUICK 34 (TOURNIQUET CUFF) ×1
CUFF TRNQT CYL 34X4X40X1 (TOURNIQUET CUFF) ×1 IMPLANT
DRSG ADAPTIC 3X8 NADH LF (GAUZE/BANDAGES/DRESSINGS) ×2 IMPLANT
DRSG EMULSION OIL 3X3 NADH (GAUZE/BANDAGES/DRESSINGS) ×2 IMPLANT
DRSG PAD ABDOMINAL 8X10 ST (GAUZE/BANDAGES/DRESSINGS) ×2 IMPLANT
DURAPREP 26ML APPLICATOR (WOUND CARE) ×2 IMPLANT
ELECT REM PT RETURN 9FT ADLT (ELECTROSURGICAL) ×2
ELECTRODE REM PT RTRN 9FT ADLT (ELECTROSURGICAL) ×1 IMPLANT
GLOVE BIO SURGEON STRL SZ7.5 (GLOVE) ×2 IMPLANT
GLOVE BIO SURGEON STRL SZ8 (GLOVE) ×4 IMPLANT
GLOVE BIOGEL PI IND STRL 8 (GLOVE) ×1 IMPLANT
GLOVE BIOGEL PI INDICATOR 8 (GLOVE) ×1
GLOVE ECLIPSE 8.0 STRL XLNG CF (GLOVE) ×6 IMPLANT
MANIFOLD NEPTUNE II (INSTRUMENTS) ×4 IMPLANT
PACK ARTHROSCOPY WL (CUSTOM PROCEDURE TRAY) ×2 IMPLANT
PAD CAST 4YDX4 CTTN HI CHSV (CAST SUPPLIES) ×1 IMPLANT
PADDING CAST COTTON 4X4 STRL (CAST SUPPLIES) ×1
PADDING WEBRIL 4 STERILE (GAUZE/BANDAGES/DRESSINGS) ×2 IMPLANT
POSITIONER SURGICAL ARM (MISCELLANEOUS) ×2 IMPLANT
SET ARTHROSCOPY TUBING (MISCELLANEOUS) ×1
SET ARTHROSCOPY TUBING LN (MISCELLANEOUS) ×1 IMPLANT
SPONGE GAUZE 4X4 12PLY (GAUZE/BANDAGES/DRESSINGS) ×2 IMPLANT
SUT ETHILON 4 0 PS 2 18 (SUTURE) ×2 IMPLANT
WAND 90 DEG TURBOVAC W/CORD (SURGICAL WAND) ×2 IMPLANT
WRAP KNEE MAXI GEL POST OP (GAUZE/BANDAGES/DRESSINGS) ×2 IMPLANT

## 2011-02-05 NOTE — Progress Notes (Signed)
Dr. Simonne Come  Did ordered SCD

## 2011-02-05 NOTE — Brief Op Note (Signed)
02/05/2011  4:20 PM  PATIENT:  Andrew Moran  65 y.o. male  PRE-OPERATIVE DIAGNOSIS:  torn lateral meniscus right knee   POST-OPERATIVE DIAGNOSIS:  torn lateral meniscus right knee  PROCEDURE:  Procedure(s): ARTHROSCOPY KNEE  SURGEON:  Surgeon(s): James P Aplington  PHYSICIAN ASSISTANT:   ASSISTANTS: none   ANESTHESIA:   general  EBL:  Total I/O In: 1000 [I.V.:1000] Out: 5 [Blood:5]  BLOOD ADMINISTERED:none  DRAINS: none   LOCAL MEDICATIONS USED:  MARCAINE 20CC  SPECIMEN:  No Specimen  DISPOSITION OF SPECIMEN:  N/A  COUNTS:  YES  TOURNIQUET:   Total Tourniquet Time Documented: Thigh (Right) - 46 minutes  DICTATION: .Other Dictation: Dictation Number [id number]  PLAN OF CARE: Discharge to home after PACU  PATIENT DISPOSITION:  PACU - hemodynamically stable.   Delay start of Pharmacological VTE agent (>24hrs) due to surgical blood loss or risk of bleeding:  {YES/NO/NOT APPLICABLE:20182

## 2011-02-05 NOTE — Preoperative (Signed)
Beta Blockers   Reason not to administer Beta Blockers:Not Applicable 

## 2011-02-05 NOTE — Anesthesia Preprocedure Evaluation (Addendum)
Anesthesia Evaluation  Patient identified by MRN, date of birth, ID band Patient awake    Reviewed: Allergy & Precautions, H&P , NPO status , Patient's Chart, lab work & pertinent test results  Airway Mallampati: II TM Distance: >3 FB Neck ROM: Full    Dental No notable dental hx.    Pulmonary neg pulmonary ROS, asthma ,  clear to auscultation  Pulmonary exam normal       Cardiovascular neg cardio ROS Regular Normal    Neuro/Psych Negative Neurological ROS  Negative Psych ROS   GI/Hepatic negative GI ROS, Neg liver ROS, GERD-  Medicated,  Endo/Other  Negative Endocrine ROS  Renal/GU negative Renal ROS  Genitourinary negative   Musculoskeletal negative musculoskeletal ROS (+)   Abdominal   Peds negative pediatric ROS (+)  Hematology negative hematology ROS (+)   Anesthesia Other Findings   Reproductive/Obstetrics negative OB ROS                           Anesthesia Physical Anesthesia Plan  ASA: II  Anesthesia Plan: General   Post-op Pain Management:    Induction: Intravenous  Airway Management Planned: LMA  Additional Equipment:   Intra-op Plan:   Post-operative Plan: Extubation in OR  Informed Consent: I have reviewed the patients History and Physical, chart, labs and discussed the procedure including the risks, benefits and alternatives for the proposed anesthesia with the patient or authorized representative who has indicated his/her understanding and acceptance.   Dental advisory given  Plan Discussed with: CRNA  Anesthesia Plan Comments:         Anesthesia Quick Evaluation

## 2011-02-05 NOTE — H&P (Signed)
Andrew Moran is an 65 y.o. male.   Chief Complaint: rt knee pain HPI: MRI performed--showing torn lateral meniscus  Past Medical History  Diagnosis Date  . GERD (gastroesophageal reflux disease)   . Fracture, clavicle 1981    LEFT CLAVICLE  . Back fracture 1988    L3 TO L 4  . Kidney stone 1967  . Diverticulitis 2002  . Asthma     Past Surgical History  Procedure Date  . Tonsillectomy 1951     AND ADENOIDS  . Exploratory laparotomy 1970    EXPLORATORY STOMACH SURGERY FOR TEAR IN STOMACH WALL  . Carpal tunnel release 1984    RIGHT   . Carpal tunnel release 1983    LEFT HAND  . Lithotripsy 1986  . Ankle surgery 1988    RIGHT FOOT WITH SCREWS  . Back surgery 1995    LOWER BACK  . Back surgery 1995    2ND BACK SURGERY LOWER BACK  . Rotator cuff repair 1997    RIGHT  . Rotator cuff repair 1997    LEFT  . Lithotripsy 2001  . Lower back injection 2002  . Lithotripsy 2005  . Lithotripsy 2008  . Ankle surgery 2008    SCREWS REMOVED FROM RIGHT FOOT  . Lower back injection 2011    History reviewed. No pertinent family history. Social History:  reports that he has never smoked. He has never used smokeless tobacco. He reports that he drinks alcohol. He reports that he does not use illicit drugs.  Allergies: No Known Allergies  Medications Prior to Admission  Medication Dose Route Frequency Provider Last Rate Last Dose  . 1 mL EPINEPHrine 1 mg/mL (1:1000) in lactated ringers (3000 mL) irrigation    PRN Taeveon Keesling P Sara Keys      . bupivacaine-EPINEPHrine (MARCAINE W/ EPI) 0.5 % (with pres) injection    PRN Dorleen Kissel P Calan Doren   30 mL at 02/05/11 1417  . morphine 4 MG/ML injection    PRN Illene Labrador Ortencia Askari   4 mg at 02/05/11 1415  . mupirocin ointment (BACTROBAN) 2 %   Nasal BID Anai Lipson P Rillie Riffel      . DISCONTD: chlorhexidine (HIBICLENS) 4 % liquid 4 application  60 mL Topical Once Kourtnie Sachs P Lashannon Bresnan       No current outpatient prescriptions on file as of 02/05/2011.     Results for orders placed during the hospital encounter of 02/04/11 (from the past 48 hour(s))  SURGICAL PCR SCREEN     Status: Abnormal   Collection Time   02/04/11  3:25 PM      Component Value Range Comment   MRSA, PCR NEGATIVE  NEGATIVE     Staphylococcus aureus POSITIVE (*) NEGATIVE    CBC     Status: Normal   Collection Time   02/04/11  3:40 PM      Component Value Range Comment   WBC 7.6  4.0 - 10.5 (K/uL)    RBC 4.52  4.22 - 5.81 (MIL/uL)    Hemoglobin 13.8  13.0 - 17.0 (g/dL)    HCT 09.8  11.9 - 14.7 (%)    MCV 89.4  78.0 - 100.0 (fL)    MCH 30.5  26.0 - 34.0 (pg)    MCHC 34.2  30.0 - 36.0 (g/dL)    RDW 82.9  56.2 - 13.0 (%)    Platelets 264  150 - 400 (K/uL)    No results found.  ROS  Blood pressure 116/77, pulse 90, temperature  97 F (36.1 C), resp. rate 20, SpO2 95.00%. Physical Exam  Constitutional: He is oriented to person, place, and time. He appears well-developed and well-nourished.  HENT:  Head: Normocephalic.  Right Ear: External ear normal.  Left Ear: External ear normal.  Mouth/Throat: Oropharynx is clear and moist.  Eyes: EOM are normal.  Neck: Normal range of motion. Neck supple.  Cardiovascular: Normal rate, regular rhythm and normal heart sounds.   Respiratory: Effort normal and breath sounds normal.  GI: Soft. Bowel sounds are normal.  Musculoskeletal:       Tender lateral joint line rt knee.  Effusion.  Neurological: He is alert and oriented to person, place, and time.  Skin: Skin is warm and dry.  Psychiatric: He has a normal mood and affect. His behavior is normal. Judgment and thought content normal.     Assessment/Plan Torn lateral meniscus rt knee Rt knee arthroscopy, partial lateral meniscectomyrt Nettie Wyffels P 02/05/2011, 2:19 PM

## 2011-02-05 NOTE — Transfer of Care (Signed)
Immediate Anesthesia Transfer of Care Note  Patient: Andrew Moran  Procedure(s) Performed:  ARTHROSCOPY KNEE - Right knee arthroscopy partial lateral and medial meniscectomy with condyle debridement  Patient Location: PACU  Anesthesia Type: General  Level of Consciousness: awake, alert  and oriented  Airway & Oxygen Therapy: Patient Spontanous Breathing and Patient connected to face mask oxygen  Post-op Assessment: Report given to PACU RN and Post -op Vital signs reviewed and stable  Post vital signs: Reviewed and stable  Complications: No apparent anesthesia complications

## 2011-02-05 NOTE — Anesthesia Postprocedure Evaluation (Signed)
Anesthesia Post Note  Patient: Andrew Moran  Procedure(s) Performed:  ARTHROSCOPY KNEE - Right knee arthroscopy partial lateral and medial meniscectomy with condyle debridement  Anesthesia type: General  Patient location: PACU  Post pain: Pain level controlled  Post assessment: Post-op Vital signs reviewed  Last Vitals:  Filed Vitals:   02/05/11 1547  BP:   Pulse:   Temp: 36.1 C  Resp:     Post vital signs: Reviewed  Level of consciousness: sedated  Complications: No apparent anesthesia complications

## 2011-02-06 NOTE — Brief Op Note (Signed)
02/05/2011  7:18 PM  PATIENT:  Andrew Moran  65 y.o. male  PRE-OPERATIVE DIAGNOSIS:  torn lateral meniscus right knee   POST-OPERATIVE DIAGNOSIS:  torn lateral meniscus right knee  PROCEDURE:  Procedure(s): Right knee arthroscopy with partial lateral meniscectomy and shaving of medial and lateral femoral condyles  SURGEON:  Surgeon(s): James P Aplington  PHYSICIAN ASSISTANT:   ASSISTANTS: none   ANESTHESIA:   general  EBL:  Total I/O In: 1590 [P.O.:240; I.V.:1150; IV Piggyback:200] Out: 5 [Blood:5]  BLOOD ADMINISTERED:none  DRAINS: none   LOCAL MEDICATIONS USED:  MARCAINE 20CC  SPECIMEN:  No Specimen  DISPOSITION OF SPECIMEN:  N/A  COUNTS:  YES  TOURNIQUET:   Total Tourniquet Time Documented: Thigh (Right) - 46 minutes  DICTATION: .Other Dictation: Dictation Number [id number]  PLAN OF CARE: Discharge to home after PACU  PATIENT DISPOSITION:  PACU - hemodynamically stable.   Delay start of Pharmacological VTE agent (>24hrs) due to surgical blood loss or risk of bleeding:  {YES/NO/NOT APPLICABLE:20182

## 2011-02-06 NOTE — Op Note (Signed)
NAMESHEENA, Andrew Moran NO.:  [id number]  MEDICAL RECORD NO.:  [id number]  LOCATION:  [facility]                         FACILITY:  [facility]  PHYSICIAN:  Marlowe Kays, M.D.  DATE OF BIRTH:  Mar 28, 1945  DATE OF PROCEDURE: DATE OF DISCHARGE:  02/05/2011                              OPERATIVE REPORT   PREOPERATIVE DIAGNOSIS:  Torn lateral meniscus, right knee.  POSTOPERATIVE DIAGNOSES: 1. Torn lateral meniscus. 2. Osteoarthritis, right knee.  OPERATION: 1. Right knee arthroscopy. 2. Partial lateral meniscectomy. 3. Shaving of medial and lateral femoral condyle.  SURGEON:  Marlowe Kays, M.D.  ASSISTANT:  Nurse.  ANESTHESIA:  General.  PATHOLOGY INDICATION FOR PROCEDURE:  Because of painful right knee, he had an MRI performed demonstrating complex a tear of the lateral meniscus.  Because of significant pain and swelling, he is here today for the above-mentioned surgery.  PROCEDURE:  Satisfactory general anesthesia, Ace wrap, and knee support to left lower extremity, pneumatic tourniquet applied to right lower extremity with the leg Esmarch out nonsterilely and tourniquet inflated to 350 mmHg.  Thigh stabilizer applied.  Right leg was then prepped with DuraPrep from stabilizer to ankle and draped in sterile field.  Superior medial saline inflow, after time-out performed, 1st through an anteromedial portal, lateral compartment knee joint was evaluated and found essentially double bucket-handle tear.  Pictures were taken, and I then began removing the badly macerated and deformed lateral meniscus with combination of arthroscopic scissors, baskets and 3.5 shaver, also smoothed down a small area of wear in the lateral femoral condyle.  His ACL was intact.  Final pictures were taken,  then looking at the lateral gutter and suprapatellar area, there was minimal wear in his patella.  I then reversed portals.  The medial meniscus was normal, but he had grade 2-3  chondromalacia in medial femoral condyle, which I shaved down until smooth.  After irrigating the knee well, I removed all fluid possible and closed the 2 entry portals with 4-0 nylon.  I then injected 20 cc 0.5% Marcaine with adrenaline, 4 mg morphine through the inflow apparatus, which I removed and closed this portal with 4-0 nylon as well.  Betadine, Adaptic, dry sterile dressing were applied.  Tourniquet was released.  He tolerated the procedure well, taken to recovery room in satisfactory condition with no known complications.          ______________________________ Marlowe Kays, M.D.     JA/MEDQ  D:  02/06/2011  T:  02/06/2011  Job:  [id number]

## 2011-02-10 ENCOUNTER — Encounter (HOSPITAL_COMMUNITY): Payer: Self-pay | Admitting: Orthopedic Surgery

## 2011-02-10 NOTE — Op Note (Signed)
NAMEDARRICK, GREENLAW NO.:  [id number]  MEDICAL RECORD NO.:  [id number]  LOCATION:                                 FACILITY:  PHYSICIAN:  Marlowe Kays, M.D.  DATE OF BIRTH:  12-Dec-1945  DATE OF PROCEDURE:  02/05/2011 DATE OF DISCHARGE:  02/05/2011                              OPERATIVE REPORT   PREOPERATIVE DIAGNOSIS:  Torn lateral meniscus, right knee.  POSTOPERATIVE DIAGNOSES: 1. Torn lateral meniscus. 2. Chondromalacia, medial and lateral femoral condyles, right knee.  OPERATION: 1. Right knee arthroscopy with partial lateral meniscectomy. 2. Shaving of medial and lateral femoral condyle.  SURGEON:  Marlowe Kays, M.D.  ASSISTANT:  Nurse.  ANESTHESIA:  General.  PATHOLOGY AND INDICATION FOR PROCEDURE:  Because of painful right knee, an MRI was performed demonstrating the preoperative diagnoses.  He was having significant pain and swelling leading to today's surgery.  PROCEDURE:  Satisfied general anesthesia, Ace wrap, and knee support to left lower extremity, pneumatic tourniquet applied to right lower extremity with the leg Esmarched out nonsterilely and tourniquet inflated to 350 mmHg.  Thigh stabilizer applied.  The right leg was then prepped with DuraPrep from stabilizer to ankle and draped in sterile field.  Time-out performed.  Superior medial saline inflow, first through an anteromedial portal, lateral compartment knee joint was evaluated.  She had a complex bucket handle type tear, which I pictured, then began resecting with a combination of arthroscopic scissors, baskets and a 3.5 shaver.  Tear actually extent only into the posterior 3rd.  Final remaining meniscus was nice and stable to probing.  Looking at the lateral gutter and suprapatellar area, he had some minimal wear of the patella, but nothing requiring shaving.  On reversing portals, ACL was intact.  He had some grade 2 border and grade 3 chondromalacia of the medial  femoral condyle which I shaved down until smooth.  The medial meniscus was intact, noted particularly in the medial knee, but also slightly laterally was a good bit of calcium indicating chondrocalcinosis of the meniscus.  Knee joint was irrigated to clear all fluid possible removed.  I closed the 2 entry portals with 4-0 nylon.  I injected 20 cc of 0.5% Marcaine with adrenaline, 4 mg of morphine through the inflow apparatus, and I then closed this portal with 4-0 nylon as well.  Betadine, Adaptic, dry sterile dressing were applied.  Tourniquet was released.  He tolerated the procedure well and was taken to recovery in satisfactory condition with no known complications.          ______________________________ Marlowe Kays, M.D.     JA/MEDQ  D:  02/05/2011  T:  02/05/2011  Job:  [id number]

## 2012-07-12 ENCOUNTER — Other Ambulatory Visit: Payer: Self-pay | Admitting: Orthopedic Surgery

## 2012-07-20 ENCOUNTER — Encounter (HOSPITAL_COMMUNITY): Admission: RE | Payer: Self-pay | Source: Ambulatory Visit

## 2012-07-20 ENCOUNTER — Ambulatory Visit (HOSPITAL_COMMUNITY): Admission: RE | Admit: 2012-07-20 | Payer: Medicare Other | Source: Ambulatory Visit | Admitting: Orthopedic Surgery

## 2012-07-20 SURGERY — ARTHROSCOPY, KNEE, WITH LATERAL MENISCECTOMY
Anesthesia: General | Site: Knee | Laterality: Left

## 2013-02-21 ENCOUNTER — Other Ambulatory Visit: Payer: Self-pay | Admitting: Urology

## 2013-02-23 ENCOUNTER — Encounter (HOSPITAL_COMMUNITY): Payer: Self-pay | Admitting: *Deleted

## 2013-02-23 NOTE — Progress Notes (Signed)
Spoke to patient via phone,history obtained,updated.  Bring blue folder,insurance cards,picture ID,designated driver and living will,POA, if desires (to be placed on chart). Reinforced no aspirin(instructions to hold aspirin per your doctor), ibuprofen products 72 hours prior to procedure. No vitamins or herbal medicines 7 days prior to procedure.   Follow laxative instructions provided by urologist (office) and in blue folder. Wear easy on/off clothing and no jewelry except wedding rings and ear rings. Leave all other valuables at home. Verbalizes understanding of instructions  Arrive [facility] 12 15 2014 1415  NPO after 0700  Verbalizes understanding

## 2013-02-24 ENCOUNTER — Encounter (HOSPITAL_COMMUNITY): Payer: Self-pay | Admitting: Pharmacy Technician

## 2013-02-28 ENCOUNTER — Encounter (HOSPITAL_COMMUNITY): Payer: Self-pay | Admitting: *Deleted

## 2013-02-28 ENCOUNTER — Encounter (HOSPITAL_COMMUNITY)
Admission: RE | Disposition: A | Discharge status: Home / Self Care | Payer: Self-pay | Source: Ambulatory Visit | Attending: Urology

## 2013-02-28 ENCOUNTER — Ambulatory Visit (HOSPITAL_COMMUNITY)
Admission: RE | Admit: 2013-02-28 | Discharge: 2013-02-28 | Disposition: A | Discharge status: Home / Self Care | Payer: Medicare Other | Source: Ambulatory Visit | Attending: Urology | Admitting: Urology

## 2013-02-28 ENCOUNTER — Ambulatory Visit (HOSPITAL_COMMUNITY): Payer: Medicare Other

## 2013-02-28 DIAGNOSIS — J45909 Unspecified asthma, uncomplicated: Secondary | ICD-10-CM | POA: Insufficient documentation

## 2013-02-28 DIAGNOSIS — E7800 Pure hypercholesterolemia, unspecified: Secondary | ICD-10-CM | POA: Insufficient documentation

## 2013-02-28 DIAGNOSIS — Z79899 Other long term (current) drug therapy: Secondary | ICD-10-CM | POA: Insufficient documentation

## 2013-02-28 DIAGNOSIS — N281 Cyst of kidney, acquired: Secondary | ICD-10-CM | POA: Insufficient documentation

## 2013-02-28 DIAGNOSIS — N200 Calculus of kidney: Secondary | ICD-10-CM | POA: Insufficient documentation

## 2013-02-28 DIAGNOSIS — M109 Gout, unspecified: Secondary | ICD-10-CM | POA: Insufficient documentation

## 2013-02-28 DIAGNOSIS — K219 Gastro-esophageal reflux disease without esophagitis: Secondary | ICD-10-CM | POA: Insufficient documentation

## 2013-02-28 SURGERY — LITHOTRIPSY, ESWL
Anesthesia: LOCAL | Laterality: Left

## 2013-02-28 MED ORDER — CIPROFLOXACIN HCL 500 MG PO TABS
500.0000 mg | ORAL_TABLET | ORAL | Status: AC
  Administered 2013-02-28: 500 mg via ORAL
  Filled 2013-02-28 (×2): qty 1

## 2013-02-28 MED ORDER — OXYCODONE-ACETAMINOPHEN 5-325 MG PO TABS: 1.0000 | ORAL_TABLET | Freq: Four times a day (QID) | ORAL | Status: DC | PRN

## 2013-02-28 MED ORDER — SODIUM CHLORIDE 0.9 % IV SOLN
INTRAVENOUS | Status: DC
  Administered 2013-02-28: 1000 mL via INTRAVENOUS

## 2013-02-28 MED ORDER — DIAZEPAM 5 MG PO TABS
10.0000 mg | ORAL_TABLET | ORAL | Status: AC
  Administered 2013-02-28: 10 mg via ORAL
  Filled 2013-02-28: qty 2

## 2013-02-28 MED ORDER — DIPHENHYDRAMINE HCL 25 MG PO CAPS
25.0000 mg | ORAL_CAPSULE | ORAL | Status: AC
  Administered 2013-02-28: 25 mg via ORAL
  Filled 2013-02-28: qty 1

## 2013-02-28 MED ORDER — CIPROFLOXACIN HCL 500 MG PO TABS: 500.0000 mg | ORAL_TABLET | Freq: Two times a day (BID) | ORAL | Status: DC

## 2013-02-28 NOTE — H&P (Signed)
History of Present Illness             F/u -     1- nephrolithiasis -  -December 2010 there was a nest of stones on the left medial kidney but could not see any on the right on KUB. He had a left lithotripsy done in July of 2008. He had a few fragments left in his lower pole. Stones are calcium oxalate in type. He has hypercalciuria, but on 1 hydrochlorothiazide and 1 potassium a day he has done well. He stopped his medicine however April 2011 when he had gout and was put on allopurinol and meloxicam. He came in July 2010 to discuss his 24-hour urine test which was done off his hydrochlorothiazide and his colchicine. This showed good volume of 2200 cc, but still had hypercalciuria with 317 mg per 24 hours. His oxalate, citrate were normal. He was to start back on his hydrochlorothiazide September 2011.    -Sept 2011 acute left sided flank pain and gross hematuria that began September 2011. He had similar pain and hematuria June 2011, but passed a small stone and the pain subsided.   -Sept 2014 uric acid 6.1, ca 9.5, CO2 28  -Nov 2014 KUB reviewed --> CT hematuria - 7 mm LUP, 11 mm LLP - increasing. 5mm RMP - slowly increasing.     2- bilateral renal cysts - Renal ultrasound 2012 - compared to the CT scan done September 2011. Both kidneys well visualized. The right kidney measures 11.2 cm. There were 2 cysts of the right kidney the largest in the upper pole measured 1.2 cm and one in the lower pole measured less than 1 cm. There was a 0.37 cm stone in the right mid kidney. The right kidney measures 11.3 cm with a large complex cyst with septations on the mid outer border to measure 4.9 x 4.4 cm. There were 2 other subcentimeter cyst of the upper pole and 2 stones on the left that were 5-7 mm. No obstruction or hydronephrosis. PVR was 52 cc    -Nov 2014 - renal U/S - poss hydro --> CT hematuria protocol - benign LLP cyst and smaller right cyst    3-PCa screening   PSA was 1.0 June 2012.    Aug 2013 PSA 1.31 which is stable for him.  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 He and his wife went on a long cruise to [place] and [place] December 2011. He went [place] in [place].         Dec 2014 Interval Hx  He returns to review CT results. He has had no flank pain or stone passage. I reviewed CT images and compared to CT from 2011.   Past Medical History Problems  1. History of Asthma (493.90) 2. History of Gout (274.00) 3. History of esophageal reflux (V12.79) 4. History of hypercholesterolemia (V12.29)  Surgical History Problems  1. History of Ankle Repair 2. History of Back Surgery 3. History of Knee Surgery 4. History of Rotator Cuff Repair  Current Meds 1. Advair Diskus 250-50 MCG/DOSE Inhalation Aerosol Powder Breath Activated;  Therapy: 05Oct2012 to Recorded 2. Albuterol AERS;  Therapy: (Recorded:21Jan2008) to Recorded 3. Apple Cider Vinegar CAPS;  Therapy: (Recorded:26Aug2013) to Recorded 4. Atorvastatin Calcium 20 MG Oral Tablet;  Therapy:  06Sep2012 to Recorded 5. Desonide 0.05 % External Ointment;  Therapy: 19Aug2013 to Recorded 6. Glucosamine Chondroitin TABS;  Therapy: (Recorded:21Jan2008) to Recorded 7. Levofloxacin 500 MG Oral Tablet;  Therapy: 01Dec2014 to Recorded 8. Multi-Vitamin TABS;  Therapy: (Recorded:21Jan2008) to Recorded  Allergies Medication  1. Zithromax Z-Pak TABS  Family History Problems  1. Family history of Acute Myocardial Infarction : Father 2. Family history of Death In The Family Father   Deceased at age 48 3. Family history of Family Health Status Number Of Children   1 son 4. Family history of Glaucoma : Mother 5. Family history of Urologic Disorder (V18.7) : Son   kidney stone  Social History Problems  1. Alcohol Use   occasional 2.  Caffeine Use   3-4 per day 3. Marital History - Currently Married 4. Never A Smoker 5. Occupation:   travel Market researcher Vital Signs [Data Includes: Last 1 Day]  Recorded: 03Dec2014 11:34AM  Blood Pressure: 125 / 78 Temperature: 97.6 F Heart Rate: 71  Physical Exam Constitutional: Well nourished and well developed . No acute distress.  Pulmonary: No respiratory distress and normal respiratory rhythm and effort.  Cardiovascular: Heart rate and rhythm are normal . No peripheral edema.  Neuro/Psych:. Mood and affect are appropriate.    Results/Data Urine [Data Includes: Last 1 Day]   03Dec2014  COLOR YELLOW   APPEARANCE CLEAR   SPECIFIC GRAVITY 1.020   pH 6.5   GLUCOSE NEG mg/dL  BILIRUBIN NEG   KETONE NEG mg/dL  BLOOD TRACE   PROTEIN NEG mg/dL  UROBILINOGEN 0.2 mg/dL  NITRITE NEG   LEUKOCYTE ESTERASE NEG   SQUAMOUS EPITHELIAL/HPF NONE SEEN   WBC 0-2 WBC/hpf  RBC 0-2 RBC/hpf  BACTERIA NONE SEEN   CRYSTALS NONE SEEN   CASTS Hyaline casts noted    Assessment Assessed  1. Bilateral kidney stones (592.0)  Plan Abdominal pain, LLQ (left lower quadrant), Bilateral kidney stones, Health Maintenance  1. Follow-up Month x 6 Office  Follow-up  Status: Hold For - Appointment,Date of Service   Requested for: 03Dec2014 2. KUB; Status:Hold For - Appointment,Date of Service; Requested for:03Dec2014;  Health Maintenance  3. UA With REFLEX; Status:Resulted - Requires Verification;   Done: 03Dec2014 11:07AM  Discussion/Summary Discussed CT results and nature, R/B of surveillance, ESWL , URS and PCNL. Left stones are enlarging. He has had good results with ESWL and wants to consider. Discussed failure to fragment, pass, bleeding, mult procedures among others,       Signatures Electronically signed by : Jerilee Field, M.D.; Feb 16 2013 12:19PM EST

## 2013-02-28 NOTE — Op Note (Signed)
See [facility] scanned op note

## 2013-02-28 NOTE — Interval H&P Note (Signed)
History and Physical Interval Note:  02/28/2013 6:13 PM  Andrew Moran  has presented today for surgery, with the diagnosis of LEFT NEPHROLITHIASIS  The various methods of treatment have been discussed with the patient and family. After consideration of risks, benefits and other options for treatment, the patient has consented to  Procedure(s): LEFT EXTRACORPOREAL SHOCK WAVE LITHOTRIPSY (ESWL) (Left) as a surgical intervention .  The patient's history has been reviewed, patient examined, no change in status, stable for surgery.  I have reviewed the patient's chart and labs.  Questions were answered to the patient's satisfaction.  Discussed focus on left lower pole and then proceed with left upper pole if any shocks remain.    Antony Haste

## 2013-07-13 ENCOUNTER — Encounter (HOSPITAL_COMMUNITY): Payer: Self-pay | Admitting: Emergency Medicine

## 2013-07-13 ENCOUNTER — Emergency Department (HOSPITAL_COMMUNITY): Payer: Medicare Other

## 2013-07-13 ENCOUNTER — Emergency Department (HOSPITAL_COMMUNITY)
Admission: EM | Admit: 2013-07-13 | Discharge: 2013-07-14 | Disposition: A | Discharge status: Home / Self Care | Payer: Medicare Other | Attending: Emergency Medicine | Admitting: Emergency Medicine

## 2013-07-13 DIAGNOSIS — M79609 Pain in unspecified limb: Secondary | ICD-10-CM | POA: Insufficient documentation

## 2013-07-13 DIAGNOSIS — Z8781 Personal history of (healed) traumatic fracture: Secondary | ICD-10-CM | POA: Insufficient documentation

## 2013-07-13 DIAGNOSIS — IMO0002 Reserved for concepts with insufficient information to code with codable children: Secondary | ICD-10-CM | POA: Insufficient documentation

## 2013-07-13 DIAGNOSIS — M79605 Pain in left leg: Secondary | ICD-10-CM

## 2013-07-13 DIAGNOSIS — Z8719 Personal history of other diseases of the digestive system: Secondary | ICD-10-CM | POA: Insufficient documentation

## 2013-07-13 DIAGNOSIS — R071 Chest pain on breathing: Secondary | ICD-10-CM | POA: Insufficient documentation

## 2013-07-13 DIAGNOSIS — Z87442 Personal history of urinary calculi: Secondary | ICD-10-CM | POA: Insufficient documentation

## 2013-07-13 DIAGNOSIS — R0602 Shortness of breath: Secondary | ICD-10-CM | POA: Insufficient documentation

## 2013-07-13 DIAGNOSIS — J45909 Unspecified asthma, uncomplicated: Secondary | ICD-10-CM | POA: Insufficient documentation

## 2013-07-13 DIAGNOSIS — Z79899 Other long term (current) drug therapy: Secondary | ICD-10-CM | POA: Insufficient documentation

## 2013-07-13 LAB — BASIC METABOLIC PANEL
BUN: 12 mg/dL (ref 6–23)
CALCIUM: 9.3 mg/dL (ref 8.4–10.5)
CHLORIDE: 100 meq/L (ref 96–112)
CO2: 22 mEq/L (ref 19–32)
Creatinine, Ser: 0.89 mg/dL (ref 0.50–1.35)
GFR calc Af Amer: >90 mL/min (ref >90)
GFR calc non Af Amer: 85 mL/min — ABNORMAL LOW (ref >90)
Glucose, Bld: 92 mg/dL (ref 70–99)
Potassium: 4 mEq/L (ref 3.7–5.3)
SODIUM: 138 meq/L (ref 137–147)

## 2013-07-13 LAB — CBC WITH DIFFERENTIAL/PLATELET
Basophils Absolute: 0 10*3/uL (ref 0.0–0.1)
Basophils Relative: 0 % (ref 0–1)
EOS PCT: 6 % — AB (ref 0–5)
Eosinophils Absolute: 0.6 10*3/uL (ref 0.0–0.7)
HCT: 38.5 % — ABNORMAL LOW (ref 39.0–52.0)
Hemoglobin: 13.5 g/dL (ref 13.0–17.0)
LYMPHS PCT: 20 % (ref 12–46)
Lymphs Abs: 1.8 10*3/uL (ref 0.7–4.0)
MCH: 30.8 pg (ref 26.0–34.0)
MCHC: 35.1 g/dL (ref 30.0–36.0)
MCV: 87.9 fL (ref 78.0–100.0)
Monocytes Absolute: 0.7 10*3/uL (ref 0.1–1.0)
Monocytes Relative: 8 % (ref 3–12)
NEUTROS ABS: 5.8 10*3/uL (ref 1.7–7.7)
Neutrophils Relative %: 65 % (ref 43–77)
PLATELETS: 225 10*3/uL (ref 150–400)
RBC: 4.38 MIL/uL (ref 4.22–5.81)
RDW: 13.3 % (ref 11.5–15.5)
WBC: 8.9 10*3/uL (ref 4.0–10.5)

## 2013-07-13 LAB — D-DIMER, QUANTITATIVE: D-Dimer, Quant: <0.27 ug/mL-FEU (ref 0.00–0.48)

## 2013-07-13 LAB — TROPONIN I: Troponin I: <0.3 ng/mL (ref <0.30)

## 2013-07-13 LAB — PROTIME-INR
INR: 1.02 (ref 0.00–1.49)
PROTHROMBIN TIME: 13.2 s (ref 11.6–15.2)

## 2013-07-13 LAB — APTT: aPTT: 28 seconds (ref 24–37)

## 2013-07-13 MED ORDER — IOHEXOL 350 MG/ML SOLN
100.0000 mL | Freq: Once | INTRAVENOUS | Status: AC | PRN
  Administered 2013-07-13: 100 mL via INTRAVENOUS

## 2013-07-13 MED ORDER — SODIUM CHLORIDE 0.9 % IV SOLN
INTRAVENOUS | Status: DC
  Administered 2013-07-13: 22:00:00 via INTRAVENOUS

## 2013-07-13 NOTE — ED Notes (Signed)
Pt states he is having swelling and pain to his left lower leg  Pt states he noticed it about 1700 today  Pt went to urgent care and was sent here to rule out DVT  Paperwork with pt

## 2013-07-13 NOTE — Discharge Instructions (Signed)
Return here for any trouble breathing, increased lower extremity pain. Or if you decide that he would like to have the Doppler of your leg for evaluation of possible DVT

## 2013-07-13 NOTE — ED Provider Notes (Signed)
CSN: [id number]     Arrival date & time 07/13/13  2116 History   First MD Initiated Contact with Patient 07/13/13 2131     Chief Complaint  Patient presents with  . Leg Pain     (Consider location/radiation/quality/duration/timing/severity/associated sxs/prior Treatment) Patient is a 68 y.o. male presenting with leg pain. The history is provided by the spouse and the patient.  Leg Pain  patient here complaining of left lower extremity pain x1 day. Symptoms started when he crossed his leg and felt pain in his Achilles tendon area. No leg swelling. He is also some shortness of breath and right-sided chest wall pain. Denies any fever or cough. Denies any anginal type symptoms. Went to urgent care Center and was sent here to have Doppler to rule out pulmonary embolism. He has no prior history of PE. He has no recent travel history.  Past Medical History  Diagnosis Date  . GERD (gastroesophageal reflux disease)   . Fracture, clavicle 1981    LEFT CLAVICLE  . Back fracture 1988    L3 TO L 4  . Kidney stone 1967  . Diverticulitis 2002  . Asthma    Past Surgical History  Procedure Laterality Date  . Tonsillectomy  1951     AND ADENOIDS  . Exploratory laparotomy  1970    EXPLORATORY STOMACH SURGERY FOR TEAR IN STOMACH WALL  . Carpal tunnel release  1984    RIGHT   . Carpal tunnel release  1983    LEFT HAND  . Lithotripsy  1986  . Ankle surgery  1988    RIGHT FOOT WITH SCREWS  . Back surgery  1995    LOWER BACK  . Back surgery  1995    2ND BACK SURGERY LOWER BACK  . Rotator cuff repair  1997    RIGHT  . Rotator cuff repair  1997    LEFT  . Lithotripsy  2001  . Lower back injection  2002  . Lithotripsy  2005  . Lithotripsy  2008  . Ankle surgery  2008    SCREWS REMOVED FROM RIGHT FOOT  . Lower back injection  2011  . Knee arthroscopy  02/05/2011    Procedure: ARTHROSCOPY KNEE;  Surgeon: Laurice Record A[place]ton;  Location: [facility];  Service: Orthopedics;  Laterality: Right;  Right  knee arthroscopy partial lateral and medial meniscectomy with condyle debridement   Family History  Problem Relation Age of Onset  . Heart attack Father    History  Substance Use Topics  . Smoking status: Never Smoker   . Smokeless tobacco: Never Used  . Alcohol Use: Yes     Comment: OCCASIONAL    Review of Systems  All other systems reviewed and are negative.     Allergies  Zithromax  Home Medications   Prior to Admission medications   Medication Sig Start Date End Date Taking? Authorizing Provider  albuterol (PROVENTIL HFA;VENTOLIN HFA) 108 (90 BASE) MCG/ACT inhaler Inhale 2 puffs into the lungs every 4 (four) hours as needed for wheezing or shortness of breath. Wheezing     Historical Provider, MD  APPLE CIDER VINEGAR PO Take 45 mLs by mouth daily.    Historical Provider, MD  atorvastatin (LIPITOR) 20 MG tablet Take 20 mg by mouth at bedtime.     Historical Provider, MD  ciprofloxacin (CIPRO) 500 MG tablet Take 1 tablet (500 mg total) by mouth 2 (two) times daily. 02/28/13   Fredricka Bonine, MD  Fluticasone-Salmeterol (ADVAIR) 250-50 MCG/DOSE AEPB  Inhale 1 puff into the lungs 2 (two) times daily.     Historical Provider, MD  Garlic 962 MG CAPS Take 600 mg by mouth daily.    Historical Provider, MD  methocarbamol (ROBAXIN) 500 MG tablet Take 500 mg by mouth every 8 (eight) hours as needed for muscle spasms. MUSCLE SPASM     Historical Provider, MD  Misc Natural Products (GLUCOS-CHONDROIT-MSM COMPLEX PO) Take 1 tablet by mouth daily.     Historical Provider, MD  Misc Natural Products (WHITE WILLOW BARK PO) Take 400 mg by mouth daily.    Historical Provider, MD  Multiple Vitamins-Minerals (MULTIVITAMINS THER. W/MINERALS) TABS Take 1 tablet by mouth daily.     Historical Provider, MD  OVER THE COUNTER MEDICATION Take 2-3 tablets by mouth daily. Papaya complex enzyme    Historical Provider, MD  OVER THE COUNTER MEDICATION Take 1 tablet by mouth 3 (three) times daily  before meals. Deglycyrrhizinated-DGL    Historical Provider, MD  oxyCODONE-acetaminophen (ROXICET) 5-325 MG per tablet Take 1-2 tablets by mouth every 6 (six) hours as needed for severe pain. 02/28/13   Fredricka Bonine, MD  Probiotic Product (PROBIOTIC FORMULA PO) Take 1 capsule by mouth daily.     Historical Provider, MD  vitamin E 400 UNIT capsule Take 400 Units by mouth daily.     Historical Provider, MD   BP 136/78  Pulse 62  Temp(Src) 98.3 F (36.8 C) (Oral)  Resp 16  SpO2 98% Physical Exam  Nursing note and vitals reviewed. Constitutional: He is oriented to person, place, and time. He appears well-developed and well-nourished.  Non-toxic appearance. No distress.  HENT:  Head: Normocephalic and atraumatic.  Eyes: Conjunctivae, EOM and lids are normal. Pupils are equal, round, and reactive to light.  Neck: Normal range of motion. Neck supple. No tracheal deviation present. No mass present.  Cardiovascular: Normal rate, regular rhythm and normal heart sounds.  Exam reveals no gallop.   No murmur heard. Pulmonary/Chest: Effort normal and breath sounds normal. No stridor. No respiratory distress. He has no decreased breath sounds. He has no wheezes. He has no rhonchi. He has no rales.    Abdominal: Soft. Normal appearance and bowel sounds are normal. He exhibits no distension. There is no tenderness. There is no rebound and no CVA tenderness.  Musculoskeletal: Normal range of motion. He exhibits no edema and no tenderness.       Legs: No swelling. Calf nontender. Soft pedis pulse 2+. No evidence of erythema or crepitus. Popliteal pulse 2+. Neurovascular intact distally  Neurological: He is alert and oriented to person, place, and time. He has normal strength. No cranial nerve deficit or sensory deficit. GCS eye subscore is 4. GCS verbal subscore is 5. GCS motor subscore is 6.  Skin: Skin is warm and dry. No abrasion and no rash noted.  Psychiatric: He has a normal mood and  affect. His speech is normal and behavior is normal.    ED Course  Procedures (including critical care time) Labs Review Labs Reviewed  TROPONIN I  CBC WITH DIFFERENTIAL  BASIC METABOLIC PANEL  PROTIME-INR  APTT  D-DIMER, QUANTITATIVE    Imaging Review No results found.   EKG Interpretation   Date/Time:  Wednesday July 13 2013 21:44:15 EDT Ventricular Rate:  80 PR Interval:  140 QRS Duration: 100 QT Interval:  380 QTC Calculation: 438 R Axis:   29 Text Interpretation:  Sinus rhythm Borderline low voltage, extremity leads  No significant change since last tracing  Confirmed by Zenia Resides  MD, Hoang Reich  ([id number]) on 07/13/2013 9:49:34 PM      MDM   Final diagnoses:  None    Patient with negative workup here for PE. I have offered the patient an outpatient Doppler which he has deferred. Risk of DVT explained to him and he accepts this. Patient feels better in terms of his lower extremity pain. He is stable for discharge    Leota Jacobsen, MD 07/13/13 2352

## 2013-07-20 ENCOUNTER — Other Ambulatory Visit: Payer: Self-pay | Admitting: Family Medicine

## 2013-07-20 DIAGNOSIS — R609 Edema, unspecified: Secondary | ICD-10-CM

## 2013-07-21 ENCOUNTER — Ambulatory Visit
Admission: RE | Admit: 2013-07-21 | Discharge: 2013-07-21 | Disposition: A | Discharge status: Home / Self Care | Payer: Medicare Other | Source: Ambulatory Visit | Attending: Family Medicine | Admitting: Family Medicine

## 2013-07-21 DIAGNOSIS — R609 Edema, unspecified: Secondary | ICD-10-CM

## 2013-07-27 ENCOUNTER — Other Ambulatory Visit: Payer: Self-pay | Admitting: Family Medicine

## 2013-07-27 DIAGNOSIS — I83812 Varicose veins of left lower extremities with pain: Secondary | ICD-10-CM

## 2013-07-27 DIAGNOSIS — I83892 Varicose veins of left lower extremities with other complications: Secondary | ICD-10-CM

## 2013-08-10 ENCOUNTER — Ambulatory Visit
Admission: RE | Admit: 2013-08-10 | Discharge: 2013-08-10 | Disposition: A | Discharge status: Home / Self Care | Payer: Medicare Other | Source: Ambulatory Visit | Attending: Family Medicine | Admitting: Family Medicine

## 2013-08-10 DIAGNOSIS — I83812 Varicose veins of left lower extremities with pain: Secondary | ICD-10-CM

## 2013-08-10 DIAGNOSIS — I83892 Varicose veins of left lower extremities with other complications: Secondary | ICD-10-CM

## 2013-08-25 ENCOUNTER — Telehealth: Payer: Self-pay | Admitting: Emergency Medicine

## 2013-08-25 NOTE — Telephone Encounter (Signed)
Called pt wife to explain that pt. Insurance has denied coverage for EVLT until pt does 51mos of conservative measures w/compression stockings, elevation, nsaids, and exercise.  We will see him back in 1mos.   Will call with an appt. When schedule is ava.    Janith Lima, EMT 08/25/2013 10:25 AM

## 2013-09-28 ENCOUNTER — Other Ambulatory Visit: Payer: Self-pay | Admitting: Urology

## 2013-09-29 ENCOUNTER — Encounter (HOSPITAL_COMMUNITY): Payer: Self-pay | Admitting: Pharmacy Technician

## 2013-09-30 ENCOUNTER — Encounter (HOSPITAL_COMMUNITY)
Admission: RE | Admit: 2013-09-30 | Discharge: 2013-09-30 | Disposition: A | Discharge status: Home / Self Care | Payer: Medicare Other | Source: Ambulatory Visit | Attending: Urology | Admitting: Urology

## 2013-09-30 ENCOUNTER — Encounter (HOSPITAL_COMMUNITY): Payer: Self-pay

## 2013-09-30 DIAGNOSIS — Z01812 Encounter for preprocedural laboratory examination: Secondary | ICD-10-CM | POA: Insufficient documentation

## 2013-09-30 DIAGNOSIS — Z01818 Encounter for other preprocedural examination: Secondary | ICD-10-CM | POA: Insufficient documentation

## 2013-09-30 LAB — CBC
HCT: 38.9 % — ABNORMAL LOW (ref 39.0–52.0)
Hemoglobin: 13.4 g/dL (ref 13.0–17.0)
MCH: 29.9 pg (ref 26.0–34.0)
MCHC: 34.4 g/dL (ref 30.0–36.0)
MCV: 86.8 fL (ref 78.0–100.0)
Platelets: 304 10*3/uL (ref 150–400)
RBC: 4.48 MIL/uL (ref 4.22–5.81)
RDW: 13.4 % (ref 11.5–15.5)
WBC: 7.3 10*3/uL (ref 4.0–10.5)

## 2013-09-30 LAB — BASIC METABOLIC PANEL
ANION GAP: 11 (ref 5–15)
BUN: 12 mg/dL (ref 6–23)
CO2: 25 mEq/L (ref 19–32)
CREATININE: 0.9 mg/dL (ref 0.50–1.35)
Calcium: 9.7 mg/dL (ref 8.4–10.5)
Chloride: 105 mEq/L (ref 96–112)
GFR calc non Af Amer: 85 mL/min — ABNORMAL LOW (ref >90)
Glucose, Bld: 101 mg/dL — ABNORMAL HIGH (ref 70–99)
Potassium: 4.7 mEq/L (ref 3.7–5.3)
Sodium: 141 mEq/L (ref 137–147)

## 2013-09-30 NOTE — Pre-Procedure Instructions (Signed)
10-01-15 EKG/ CT Chest 4'15 Epic.

## 2013-09-30 NOTE — Patient Instructions (Addendum)
[place]  09/30/2013   Your procedure is scheduled on: 7-27  -2015 Monday at 0900 AM.  Enter through [facility] Entrance and follow signs to [place]. Arrive at     0700   AM .  Call this number if you have problems the morning of surgery: [phone]  Or Presurgical Testing [phone](Saquan Furtick) For Living Will and/or Health Care Power Attorney Forms: please provide copy for your medical record,may bring AM of surgery(Forms should be already notarized -we do not provide this service).(09-30-13 Yes/ will provide copy Living Will 10-10-13 if can locate.).      Do not eat food:After Midnight.    Take these medicines the morning of surgery with A SIP OF WATER: none Use/ Bring Advair, Albuterol.    Do not wear jewelry, make-up or nail polish.  Do not wear lotions, powders, or perfumes. You may wear deodorant.  Do not shave 48 hours(2 days) prior to first CHG shower(legs and under arms).(Shaving face and neck okay.)  Do not bring valuables to the hospital.(Hospital is not responsible for lost valuables).  Contacts, dentures or removable bridgework, body piercing, hair pins may not be worn into surgery.  Leave suitcase in the car. After surgery it may be brought to your room.  For patients admitted to the hospital, checkout time is 11:00 AM the day of discharge.(Restricted visitors-Any Persons displaying flu-like symptoms or illness).    Patients discharged the day of surgery will not be allowed to drive home. Must have responsible person with you x 24 hours once discharged.  Name and phone number of your driver:Kathryn Hanf-spouse [phone] h   Special Instructions: CHG(Chlorhedine 4%-"Hibiclens","Betasept","Aplicare") Shower Use Special Wash: see special instructions.(avoid face and genitals)          ______________________    [facility] - Preparing for Surgery Before surgery, you can play an important role.  Because skin is not sterile, your skin  needs to be as free of germs as possible.  You can reduce the number of germs on your skin by washing with CHG (chlorahexidine gluconate) soap before surgery.  CHG is an antiseptic cleaner which kills germs and bonds with the skin to continue killing germs even after washing. Please DO NOT use if you have an allergy to CHG or antibacterial soaps.  If your skin becomes reddened/irritated stop using the CHG and inform your nurse when you arrive at Short Stay. Do not shave (including legs and underarms) for at least 48 hours prior to the first CHG shower.  You may shave your face/neck. Please follow these instructions carefully:  1.  Shower with CHG Soap the night before surgery and the  morning of Surgery.  2.  If you choose to wash your hair, wash your hair first as usual with your  normal  shampoo.  3.  After you shampoo, rinse your hair and body thoroughly to remove the  shampoo.                           4.  Use CHG as you would any other liquid soap.  You can apply chg directly  to the skin and wash                       Gently with a scrungie or clean washcloth.  5.  Apply the CHG Soap to your body ONLY FROM THE NECK DOWN.   Do not  use on face/ open                           Wound or open sores. Avoid contact with eyes, ears mouth and genitals (private parts).                       Wash face,  Genitals (private parts) with your normal soap.             6.  Wash thoroughly, paying special attention to the area where your surgery  will be performed.  7.  Thoroughly rinse your body with warm water from the neck down.  8.  DO NOT shower/wash with your normal soap after using and rinsing off  the CHG Soap.                9.  Pat yourself dry with a clean towel.            10.  Wear clean pajamas.            11.  Place clean sheets on your bed the night of your first shower and do not  sleep with pets. Day of Surgery : Do not apply any lotions/deodorants the morning of surgery.  Please wear clean  clothes to the hospital/surgery center.  FAILURE TO FOLLOW THESE INSTRUCTIONS MAY RESULT IN THE CANCELLATION OF YOUR SURGERY PATIENT SIGNATURE_________________________________  NURSE SIGNATURE__________________________________  ________________________________________________________________________

## 2013-10-10 ENCOUNTER — Ambulatory Visit (HOSPITAL_COMMUNITY): Payer: Medicare Other | Admitting: Anesthesiology

## 2013-10-10 ENCOUNTER — Encounter (HOSPITAL_COMMUNITY): Payer: Self-pay | Admitting: *Deleted

## 2013-10-10 ENCOUNTER — Encounter (HOSPITAL_COMMUNITY)
Admission: RE | Disposition: A | Discharge status: Home / Self Care | Payer: Self-pay | Source: Ambulatory Visit | Attending: Urology

## 2013-10-10 ENCOUNTER — Encounter (HOSPITAL_COMMUNITY): Payer: Medicare Other | Admitting: Anesthesiology

## 2013-10-10 ENCOUNTER — Ambulatory Visit (HOSPITAL_COMMUNITY)
Admission: RE | Admit: 2013-10-10 | Discharge: 2013-10-10 | Disposition: A | Discharge status: Home / Self Care | Payer: Medicare Other | Source: Ambulatory Visit | Attending: Urology | Admitting: Urology

## 2013-10-10 DIAGNOSIS — N200 Calculus of kidney: Secondary | ICD-10-CM | POA: Insufficient documentation

## 2013-10-10 DIAGNOSIS — K219 Gastro-esophageal reflux disease without esophagitis: Secondary | ICD-10-CM | POA: Diagnosis not present

## 2013-10-10 DIAGNOSIS — J45909 Unspecified asthma, uncomplicated: Secondary | ICD-10-CM | POA: Diagnosis not present

## 2013-10-10 HISTORY — PX: CYSTOSCOPY/RETROGRADE/URETEROSCOPY/STONE EXTRACTION WITH BASKET: SHX5317

## 2013-10-10 HISTORY — PX: HOLMIUM LASER APPLICATION: SHX5852

## 2013-10-10 SURGERY — CYSTOSCOPY, WITH CALCULUS REMOVAL USING BASKET
Anesthesia: General | Laterality: Left

## 2013-10-10 MED ORDER — ONDANSETRON HCL 4 MG/2ML IJ SOLN
INTRAMUSCULAR | Status: DC | PRN
  Administered 2013-10-10: 4 mg via INTRAVENOUS

## 2013-10-10 MED ORDER — DEXAMETHASONE SODIUM PHOSPHATE 10 MG/ML IJ SOLN
INTRAMUSCULAR | Status: DC | PRN
  Administered 2013-10-10: 10 mg via INTRAVENOUS

## 2013-10-10 MED ORDER — SODIUM CHLORIDE 0.9 % IJ SOLN: 3.0000 mL | Freq: Two times a day (BID) | INTRAMUSCULAR | Status: DC

## 2013-10-10 MED ORDER — FENTANYL CITRATE 0.05 MG/ML IJ SOLN
INTRAMUSCULAR | Status: AC
  Filled 2013-10-10: qty 2

## 2013-10-10 MED ORDER — IOHEXOL 300 MG/ML  SOLN
INTRAMUSCULAR | Status: DC | PRN
  Administered 2013-10-10: 6 mL via URETHRAL

## 2013-10-10 MED ORDER — PROPOFOL 10 MG/ML IV BOLUS
INTRAVENOUS | Status: DC | PRN
  Administered 2013-10-10: 200 mg via INTRAVENOUS

## 2013-10-10 MED ORDER — FENTANYL CITRATE 0.05 MG/ML IJ SOLN
INTRAMUSCULAR | Status: DC | PRN
  Administered 2013-10-10: 25 ug via INTRAVENOUS
  Administered 2013-10-10 (×2): 50 ug via INTRAVENOUS
  Administered 2013-10-10 (×3): 25 ug via INTRAVENOUS
  Administered 2013-10-10: 50 ug via INTRAVENOUS
  Administered 2013-10-10 (×2): 25 ug via INTRAVENOUS

## 2013-10-10 MED ORDER — LIDOCAINE HCL 2 % EX GEL
CUTANEOUS | Status: AC
  Filled 2013-10-10: qty 10

## 2013-10-10 MED ORDER — ACETAMINOPHEN 325 MG PO TABS: 650.0000 mg | ORAL_TABLET | ORAL | Status: DC | PRN

## 2013-10-10 MED ORDER — PROPOFOL 10 MG/ML IV BOLUS
INTRAVENOUS | Status: AC
  Filled 2013-10-10: qty 20

## 2013-10-10 MED ORDER — OXYCODONE HCL 5 MG PO TABS
5.0000 mg | ORAL_TABLET | ORAL | Status: DC | PRN
  Administered 2013-10-10: 5 mg via ORAL
  Filled 2013-10-10: qty 1

## 2013-10-10 MED ORDER — LACTATED RINGERS IV SOLN
INTRAVENOUS | Status: DC
  Administered 2013-10-10: 1000 mL via INTRAVENOUS
  Administered 2013-10-10: 11:00:00 via INTRAVENOUS

## 2013-10-10 MED ORDER — MIDAZOLAM HCL 2 MG/2ML IJ SOLN
INTRAMUSCULAR | Status: AC
  Filled 2013-10-10: qty 2

## 2013-10-10 MED ORDER — LIDOCAINE HCL (CARDIAC) 20 MG/ML IV SOLN
INTRAVENOUS | Status: AC
  Filled 2013-10-10: qty 5

## 2013-10-10 MED ORDER — OXYCODONE-ACETAMINOPHEN 5-325 MG PO TABS: 1.0000 | ORAL_TABLET | ORAL | Status: DC | PRN

## 2013-10-10 MED ORDER — SODIUM CHLORIDE 0.9 % IJ SOLN: 3.0000 mL | INTRAMUSCULAR | Status: DC | PRN

## 2013-10-10 MED ORDER — CEFAZOLIN SODIUM-DEXTROSE 2-3 GM-% IV SOLR
INTRAVENOUS | Status: AC
  Filled 2013-10-10: qty 50

## 2013-10-10 MED ORDER — LIDOCAINE HCL (CARDIAC) 20 MG/ML IV SOLN
INTRAVENOUS | Status: DC | PRN
  Administered 2013-10-10: 100 mg via INTRAVENOUS

## 2013-10-10 MED ORDER — CEFAZOLIN SODIUM-DEXTROSE 2-3 GM-% IV SOLR
2.0000 g | INTRAVENOUS | Status: AC
  Administered 2013-10-10: 2 g via INTRAVENOUS

## 2013-10-10 MED ORDER — MIDAZOLAM HCL 5 MG/5ML IJ SOLN
INTRAMUSCULAR | Status: DC | PRN
Start: 2013-10-10 — End: 2013-10-10
  Administered 2013-10-10: 2 mg via INTRAVENOUS

## 2013-10-10 MED ORDER — SODIUM CHLORIDE 0.9 % IV SOLN: 250.0000 mL | INTRAVENOUS | Status: DC | PRN

## 2013-10-10 MED ORDER — MORPHINE SULFATE 10 MG/ML IJ SOLN: 2.0000 mg | INTRAMUSCULAR | Status: DC | PRN

## 2013-10-10 MED ORDER — PROMETHAZINE HCL 25 MG/ML IJ SOLN: 6.2500 mg | INTRAMUSCULAR | Status: DC | PRN

## 2013-10-10 MED ORDER — LACTATED RINGERS IV SOLN: INTRAVENOUS | Status: DC

## 2013-10-10 MED ORDER — SODIUM CHLORIDE 0.9 % IR SOLN
Status: DC | PRN
  Administered 2013-10-10: 6000 mL via INTRAVESICAL

## 2013-10-10 MED ORDER — OXYBUTYNIN CHLORIDE 5 MG PO TABS: 5.0000 mg | ORAL_TABLET | Freq: Three times a day (TID) | ORAL | Status: DC

## 2013-10-10 MED ORDER — ACETAMINOPHEN 650 MG RE SUPP
650.0000 mg | RECTAL | Status: DC | PRN
  Filled 2013-10-10: qty 1

## 2013-10-10 MED ORDER — HYDROMORPHONE HCL PF 1 MG/ML IJ SOLN
0.2500 mg | INTRAMUSCULAR | Status: DC | PRN
Start: 2013-10-10 — End: 2013-10-10

## 2013-10-10 MED ORDER — CEPHALEXIN 500 MG PO CAPS: 500.0000 mg | ORAL_CAPSULE | Freq: Two times a day (BID) | ORAL | Status: DC

## 2013-10-10 SURGICAL SUPPLY — 23 items
BAG URO CATCHER STRL LF (DRAPE) ×4 IMPLANT
BASKET LASER NITINOL 1.9FR (BASKET) ×4 IMPLANT
BASKET ZERO TIP NITINOL 2.4FR (BASKET) ×4 IMPLANT
CATH INTERMIT  6FR 70CM (CATHETERS) ×4 IMPLANT
CLOTH BEACON ORANGE TIMEOUT ST (SAFETY) ×4 IMPLANT
DRAPE CAMERA CLOSED 9X96 (DRAPES) IMPLANT
FIBER LASER FLEXIVA 200 (UROLOGICAL SUPPLIES) ×4 IMPLANT
FIBER LASER FLEXIVA 365 (UROLOGICAL SUPPLIES) IMPLANT
GLOVE BIOGEL M STRL SZ7.5 (GLOVE) ×12 IMPLANT
GOWN STRL REUS W/ TWL XL LVL3 (GOWN DISPOSABLE) ×2 IMPLANT
GOWN STRL REUS W/TWL LRG LVL3 (GOWN DISPOSABLE) ×4 IMPLANT
GOWN STRL REUS W/TWL XL LVL3 (GOWN DISPOSABLE) ×2
GUIDEWIRE ANG ZIPWIRE 038X150 (WIRE) IMPLANT
GUIDEWIRE STR DUAL SENSOR (WIRE) ×4 IMPLANT
IV NS 1000ML (IV SOLUTION) ×2
IV NS 1000ML BAXH (IV SOLUTION) ×2 IMPLANT
MANIFOLD NEPTUNE II (INSTRUMENTS) ×4 IMPLANT
PACK CYSTO (CUSTOM PROCEDURE TRAY) ×4 IMPLANT
SHEATH ACCESS URETERAL 54CM (SHEATH) ×4 IMPLANT
STENT CONTOUR 6FRX24X.038 (STENTS) ×8 IMPLANT
SYRINGE IRR TOOMEY STRL 70CC (SYRINGE) ×4 IMPLANT
TUBING CONNECTING 10 (TUBING) ×3 IMPLANT
TUBING CONNECTING 10' (TUBING) ×1

## 2013-10-10 NOTE — Anesthesia Postprocedure Evaluation (Signed)
Anesthesia Post Note  Patient: Andrew Moran  Procedure(s) Performed: Procedure(s) (LRB): CYSTOSCOPY/URETEROSCOPY/STONE EXTRACTION WITH HOLMIUM LASER, bilateral retrograde, bilateral ureteral stents (Bilateral) HOLMIUM LASER APPLICATION (Left)  Anesthesia type: General  Patient location: PACU  Post pain: Pain level controlled  Post assessment: Post-op Vital signs reviewed  Last Vitals:  Filed Vitals:   10/10/13 1329  BP:   Pulse: 86  Temp: 36.6 C  Resp: 18    Post vital signs: Reviewed  Level of consciousness: sedated  Complications: No apparent anesthesia complications

## 2013-10-10 NOTE — Transfer of Care (Signed)
Immediate Anesthesia Transfer of Care Note  Patient: Andrew Moran  Procedure(s) Performed: Procedure(s): CYSTOSCOPY/URETEROSCOPY/STONE EXTRACTION WITH HOLMIUM LASER, bilateral retrograde, bilateral ureteral stents (Bilateral) HOLMIUM LASER APPLICATION (Left)  Patient Location: PACU  Anesthesia Type:General  Level of Consciousness: sedated  Airway & Oxygen Therapy: Patient Spontanous Breathing and Patient connected to face mask oxygen  Post-op Assessment: Report given to PACU RN and Post -op Vital signs reviewed and stable  Post vital signs: Reviewed and stable  Complications: No apparent anesthesia complications

## 2013-10-10 NOTE — Anesthesia Preprocedure Evaluation (Signed)
Anesthesia Evaluation  Patient identified by MRN, date of birth, ID band Patient awake    Reviewed: Allergy & Precautions, H&P , NPO status , Patient's Chart, lab work & pertinent test results  Airway Mallampati: II TM Distance: >3 FB Neck ROM: Full    Dental no notable dental hx. (+) Teeth Intact, Dental Advisory Given   Pulmonary neg pulmonary ROS, asthma ,  breath sounds clear to auscultation  Pulmonary exam normal       Cardiovascular negative cardio ROS  Rhythm:Regular Rate:Normal     Neuro/Psych negative neurological ROS  negative psych ROS   GI/Hepatic negative GI ROS, Neg liver ROS, GERD-  Medicated,  Endo/Other  negative endocrine ROS  Renal/GU Renal diseasenegative Renal ROS  negative genitourinary   Musculoskeletal negative musculoskeletal ROS (+)   Abdominal   Peds negative pediatric ROS (+)  Hematology negative hematology ROS (+)   Anesthesia Other Findings   Reproductive/Obstetrics negative OB ROS                           Anesthesia Physical Anesthesia Plan  ASA: II  Anesthesia Plan: General   Post-op Pain Management:    Induction: Intravenous  Airway Management Planned: LMA  Additional Equipment:   Intra-op Plan:   Post-operative Plan: Extubation in OR  Informed Consent: I have reviewed the patients History and Physical, chart, labs and discussed the procedure including the risks, benefits and alternatives for the proposed anesthesia with the patient or authorized representative who has indicated his/her understanding and acceptance.   Dental advisory given  Plan Discussed with: CRNA  Anesthesia Plan Comments:         Anesthesia Quick Evaluation

## 2013-10-10 NOTE — H&P (Signed)
Urology History and Physical Exam  CC: Kidney stones  HPI: 68  year old male presents for ureteroscopic management of left renal calculi. He has been followed for the years, but there has been some enlargement, and due to the patient's travels out of the area/country, he would like to have these removed/treated. He is aware of the risks/benefits of ureteroscopic management as well as alternatives. He desires to proceed.Bilateral renal calc  PMH: Past Medical History  Diagnosis Date  . GERD (gastroesophageal reflux disease)   . Fracture, clavicle 1981    LEFT CLAVICLE  . Back fracture 1988    L3 TO L 4  . Kidney stone 1967  . Diverticulitis 2002  . Asthma     PSH: Past Surgical History  Procedure Laterality Date  . Tonsillectomy  1951     AND ADENOIDS  . Exploratory laparotomy  1970    EXPLORATORY STOMACH SURGERY FOR TEAR IN STOMACH WALL  . Carpal tunnel release  1984    RIGHT   . Carpal tunnel release  1983    LEFT HAND  . Lithotripsy  1986  . Ankle surgery  1988    RIGHT FOOT WITH SCREWS  . Back surgery  1995    LOWER BACK  . Back surgery  1995    2ND BACK SURGERY LOWER BACK  . Rotator cuff repair  1997    RIGHT  . Rotator cuff repair  1997    LEFT  . Lithotripsy  2001  . Lower back injection  2002  . Lithotripsy  2005  . Lithotripsy  2008  . Ankle surgery  2008    SCREWS REMOVED FROM RIGHT FOOT  . Lower back injection  2011  . Knee arthroscopy  02/05/2011    Procedure: ARTHROSCOPY KNEE;  Surgeon: Laurice Record Aplington;  Location: [facility];  Service: Orthopedics;  Laterality: Right;  Right knee arthroscopy partial lateral and medial meniscectomy with condyle debridement    Allergies: Allergies  Allergen Reactions  . Zithromax [Azithromycin] Shortness Of Breath    Medications: No prescriptions prior to admission     Social History: History   Social History  . Marital Status: Married    Spouse Name: N/A    Number of Children: N/A  . Years of  Education: N/A   Occupational History  . Not on file.   Social History Main Topics  . Smoking status: Never Smoker   . Smokeless tobacco: Never Used  . Alcohol Use: Yes     Comment: OCCASIONAL  . Drug Use: No  . Sexual Activity: Yes   Other Topics Concern  . Not on file   Social History Narrative  . No narrative on file    Family History: Family History  Problem Relation Age of Onset  . Heart attack Father     Review of Systems: Positive: N/A Negative:   A further 10 point review of systems was negative except what is listed in the HPI.                  Physical Exam: @VITALS2 @ General: No acute distress.  Awake. Head:  Normocephalic.  Atraumatic. ENT:  EOMI.  Mucous membranes moist Neck:  Supple.  No lymphadenopathy. CV:  S1 present. S2 present. Regular rate. Pulmonary: Equal effort bilaterally.  Clear to auscultation bilaterally. Abdomen: Soft.  Non tender to palpation. Skin:  Normal turgor.  No visible rash. Extremity: No gross deformity of bilateral upper extremities.  No gross deformity of  lower extremities. Neurologic: Alert. Appropriate mood.    Studies:  No results found for this basename: HGB, WBC, PLT,  in the last 72 hours  No results found for this basename: NA, K, CL, CO2, BUN, CREATININE, CALCIUM, MAGNESIUM, GFRNONAA, GFRAA,  in the last 72 hours   No results found for this basename: PT, INR, APTT,  in the last 72 hours   No components found with this basename: ABG,     Assessment:  Bilateral renal calculi  Plan: Ureteroscopic stone management with holmium laser  Lithotripsy. We will focus on the left side stone burden, but may  Attempt right ureteroscopy as well--discussed w/ pt this am

## 2013-10-10 NOTE — Discharge Instructions (Signed)
[facility] [phone] Post Ureteroscopy With or Without Stent Instructions  Definitions:  Ureter: The duct that transports urine from the kidney to the bladder. Stent:   A plastic hollow tube that is placed into the ureter, from the kidney to the bladder to prevent the ureter from swelling shut.  GENERAL INSTRUCTIONS:  Despite the fact that no skin incisions were used, the area around the ureter and bladder is raw and irritated. The stent is a foreign body which will further irritate the bladder wall. This irritation is manifested by increased frequency of urination, both day and night, and by an increase in the urge to urinate. In some, the urge to urinate is present almost always. Sometimes the urge is strong enough that you may not be able to stop yourself from urinating. The only real cure is to remove the stent and then give time for the bladder wall to heal which can't be done until the danger of the ureter swelling shut has passed, which varies.  You may see some blood in your urine while the stent is in place and a few days afterwards. Do not be alarmed, even if the urine was clear for a while. Get off your feet and drink lots of fluids until clearing occurs. If you start to pass clots or don't improve, call us.  Remove stents by pulling on strings on Thursday morning.  DIET: You may return to your normal diet immediately. Because of the raw surface of your bladder, alcohol, spicy foods, acid type foods and drinks with caffeine may cause irritation or frequency and should be used in moderation. To keep your urine flowing freely and to avoid constipation, drink plenty of fluids during the day ( 8-10 glasses ). Tip: Avoid cranberry juice because it is very acidic.  ACTIVITY: Your physical activity doesn't need to be restricted. However, if you are very active, you may see some blood in your urine. We suggest that you reduce your activity under these circumstances until  the bleeding has stopped.  BOWELS: It is important to keep your bowels regular during the postoperative period. Straining with bowel movements can cause bleeding. A bowel movement every other day is reasonable. Use a mild laxative if needed, such as Milk of Magnesia 2-3 tablespoons, or 2 Dulcolax tablets. Call if you continue to have problems. If you have been taking narcotics for pain, before, during or after your surgery, you may be constipated. Take a laxative if necessary.   MEDICATION: You should resume your pre-surgery medications unless told not to. In addition you will often be given an antibiotic to prevent infection. These should be taken as prescribed until the bottles are finished unless you are having an unusual reaction to one of the drugs.  PROBLEMS YOU SHOULD REPORT TO [place]:  Fevers over 100.5 Fahrenheit.  Heavy bleeding, or clots ( See above notes about blood in urine ).  Inability to urinate.  Drug reactions ( hives, rash, nausea, vomiting, diarrhea ).  Severe burning or pain with urination that is not improving.  FOLLOW-UP: You will need a follow-up appointment to monitor your progress. Call for this appointment at the number listed above. Usually the first appointment will be about three to fourteen days after your surgery.

## 2013-10-11 ENCOUNTER — Encounter (HOSPITAL_COMMUNITY): Payer: Self-pay | Admitting: Urology

## 2013-10-13 ENCOUNTER — Other Ambulatory Visit (HOSPITAL_COMMUNITY): Payer: Self-pay | Admitting: Interventional Radiology

## 2013-10-13 DIAGNOSIS — I83892 Varicose veins of left lower extremities with other complications: Secondary | ICD-10-CM

## 2013-10-15 NOTE — Op Note (Signed)
PATIENT:  Andrew Moran  PRE-OPERATIVE DIAGNOSIS: Bilateral renal calculi, left greater than right  POST-OPERATIVE DIAGNOSIS: Same  PROCEDURE: Cystoscopy, bilateral retrograde ureteropyelograms, bilateral flexible ureteorenoscopy, holmium laser application to bilateral renal calculi, bilateral stone extraction, bilateral double-J stent (6 [place] by 24 cm contour with tether)   SURGEON:  Lillette Boxer. Kayana Thoen, M.D.  ANESTHESIA:  General  EBL:  Minimal  DRAINS: bilateral double-J stent, see above   LOCAL MEDICATIONS USED:  None  SPECIMEN:   stones, the patient's wife   INDICATION: Andrew Moran is a  68 year old male with multiple left renal calculi, a single right renal calculus. He has been followed conservatively, but recently has asked to have intervention to relieve his stone burden, as he has significant worries about traveling out of country with the stones and the need for urgent medical attention. I have discussed treatment, and seeing that he has bilateral stone burden, as well as multiple stones in his left kidney, only one in his right, I recommended one procedure to relieve him of his stone burden-ureteroscopy, laser application to his larger stones and stone extraction as well as stent placements afterwards. He understands the risks and complications involved, and desires to proceed.   Description of procedure: The patient was properly identified and marked (if applicable) in the holding area. They were then  taken to the operating room and placed on the table in a supine position. General anesthesia was then administered. Once fully anesthetized the patient was moved to the dorsolithotomy position and the genitalia and perineum were sterilely prepped and draped in standard fashion. An official timeout was then performed.   a 72 French panendoscope was advanced under direct vision into his bladder. The urethra was normal, prostatic urethra was nonobstructing. The bladder was  entered and inspected circumferentially both with the 12 and 70 lenses. The bladder appeared normal without tumors, trabeculations or foreign bodies. Ureteral orifices were normal in configuration and location.  As the patient had a larger stone burden on the left, we began with that side. A retrograde pyelogram was performed on that side using a 6 [place] open-ended catheter. This revealed a patent ureter, without evidence of filling defect, stricture or hydronephrosis. Pyelocalyceal system was normal, without dilation. Multiple filling defects were seen and calyces consistent with renal calculi.  Following retrograde ureteropyelogram, it negotiated a sensor-tip guidewire through the open-ended catheter, and using fluoroscopic guidance advanced that into the left renal pelvis. The open-ended catheter was removed. The entire left ureter was dilated first with the inner core of a 12/14 long ureteral access sheath. I then placed the entire access sheath over the guidewire, and remove the inner core. The flexible ureteroscope was advanced through the access sheath into the left kidney. Pyelocalyceal system was totally inspected. Several stones were noted in the interpolar calyces. There were smaller, multiple stones in the lower pole calyx. There were approximately 20 in this area. 3-4 present in the interpolar region. The larger stones were fragmented with application of holmium laser energy using 200  fiber. The fragments were extracted through the access sheath using an escape basket. The interpolar calyces were clear to fragments. I then negotiated the scope into the lower pole calyces. A multiple stones which were smaller were easily grasped and extracted. The larger ones were fragmented with the laser fiber.  Multiple fragments were removed.the smaller fragments, which were too small to be grasped with the escape basket, were irrigated free into the renal pelvis, with and I thought that  this would make him  is your to pass. Careful inspection of all the calyces revealed adequate removal of any significant fragments. I did have anesthesia draw 10 cc of the patient's blood through an IV site. This was allowed to clot, and placed through the scope into the interpolar region, worse over fragments were present. This was then left indwelling for approximate 7 minutes, and I then extracted the clot as best I could with the escape basket, hopefully removing some fragments with it. Careful inspection again of the pyelo-calyceal system revealed no significant fragments. I then removed the scope, and advanced a Center tip guidewire through the access sheath. The access sheath was removed. I replaced the cystoscope over the guidewire, having backloaded the guidewire. I then placed a 6 [place] by 24 cm contour stent under fluoroscopic guidance into the left ureter, with adequate positioning seen fluoroscopically and cystoscopically. The string was then carefully brought out the urethra with and the scope was removed. I then replaced the scope.  The right ureteral orifice was then cannulated with a 6 [place] open-ended catheter. Retrograde ureteropyelogram was performed. This revealed a normal ureter, without stricture or filling defect. The pyelo-calyceal system was normal except for one filling defects seen in the renal pelvis consistent with a stone.  I then negotiated a sensor-tip guidewire through the open-ended catheter. I removed the open-ended catheter and the cystoscope. The right ureter was dilated first with the inner core of the access sheath. I then placed the entire access sheath over the guidewire, removing the inner core of the guidewire, providing access to the renal pelvis. I then passed the flexible ureteroscope. The entire pyelocalyceal system was inspected. There was one 8 mm stone seen lying posteriorly in the renal pelvis. I applied holmium laser energy to this with the 200  fiber. Multiple fragments were  resulted. These were all grasped and extracted through the access sheath with the escape basket. Careful inspection of the remaining pyelo-calyceal system revealed no further fragments. I then removed the ureteroscope and passed the sensor tip guidewire through the access sheath, and then removed the access sheath. The guidewire was backloaded through the scope, and I then passed a 6 [place] by 24 cm contour stent over top of the guidewire, using fluoroscopic and cystoscopic guidance to negotiated into the pelvis. The guidewire was removed, adequate proximal and distal curls in the stent were seen. The scope was removed and the string was allowed to come through the urethra. The bladder had been drained. The strings were tied together.  At this point, the procedure was terminated. All stones were saved. There were eventually given to the patient's wife.     PLAN OF CARE: Discharge to home after PACU  PATIENT DISPOSITION:  PACU - hemodynamically stable.

## 2013-11-22 ENCOUNTER — Other Ambulatory Visit: Payer: Medicare Other

## 2013-11-22 ENCOUNTER — Ambulatory Visit
Admission: RE | Admit: 2013-11-22 | Discharge: 2013-11-22 | Disposition: A | Discharge status: Home / Self Care | Payer: Medicare Other | Source: Ambulatory Visit | Attending: Interventional Radiology | Admitting: Interventional Radiology

## 2013-11-22 DIAGNOSIS — I83892 Varicose veins of left lower extremities with other complications: Secondary | ICD-10-CM

## 2013-11-22 NOTE — Progress Notes (Signed)
States that he has been wearing thigh high compression garment (20-30 mm Hg) on LLE during waking hours. Only minimal relief with elevation and use of compression garment.    Continues to experience aching and edema of LLE which affects ADL's.  Only able to walk short distances.    Will check with insurance for preauthorization & schedule procedure at patient's convenience.    Carolynn Tuley Riki Rusk, RN 11/22/2013 11:35 AM

## 2013-12-12 ENCOUNTER — Other Ambulatory Visit (HOSPITAL_COMMUNITY): Payer: Self-pay | Admitting: Interventional Radiology

## 2013-12-12 DIAGNOSIS — I83812 Varicose veins of left lower extremities with pain: Secondary | ICD-10-CM

## 2013-12-20 ENCOUNTER — Other Ambulatory Visit (HOSPITAL_COMMUNITY): Payer: Self-pay | Admitting: Interventional Radiology

## 2013-12-20 DIAGNOSIS — I83812 Varicose veins of left lower extremities with pain: Secondary | ICD-10-CM

## 2013-12-27 ENCOUNTER — Ambulatory Visit
Admission: RE | Admit: 2013-12-27 | Discharge: 2013-12-27 | Disposition: A | Discharge status: Home / Self Care | Payer: Medicare Other | Source: Ambulatory Visit | Attending: Interventional Radiology | Admitting: Interventional Radiology

## 2013-12-27 DIAGNOSIS — I83812 Varicose veins of left lower extremities with pain: Secondary | ICD-10-CM

## 2013-12-27 NOTE — Progress Notes (Signed)
1335 Informed consent obtained.  Patient given verbal and written discharge instructions and states that he understands.  1340  Valium 5 mg po (VO Dr Barbie Banner).  1345  22 gauge x 1 " insyte catheter started IV Right antecubital (1st attempt) w/ Saline lock & 7" extension.  Flushed w/ 10 mL NSS w/o difficulty.  Site unremarkable.   1350  Procedure started.  1500  Procedure completed.  Patient wearing thigh high graduated compression garment on Left leg, 20-30 mm Hg.    1505  Saline lock d/c'd, catheter intact.  Site unremarkable.    1510  Ambulated x 10 mins w/o assistance, gait steady.  Tolerated well.  1520  Reviewed discharge instructions.    68  Patient's wife here to provide transportation to home.  1 wk follow up appointment on 01/03/14 at 1pm.  Discharged to home.   Yohance Hathorne Riki Rusk, RN 12/27/2013 3:29 PM

## 2014-01-03 ENCOUNTER — Ambulatory Visit
Admission: RE | Admit: 2014-01-03 | Discharge: 2014-01-03 | Disposition: A | Discharge status: Home / Self Care | Payer: Medicare Other | Source: Ambulatory Visit | Attending: Interventional Radiology | Admitting: Interventional Radiology

## 2014-01-03 DIAGNOSIS — I83812 Varicose veins of left lower extremities with pain: Secondary | ICD-10-CM

## 2014-01-04 NOTE — Progress Notes (Signed)
Chief Complaint: Follow-up post endovenous ablation  Referring Physician(s): Hoss,Art A  History of Present Illness: Andrew Moran is a 68 y.o. male with symptomatic LLE venous insufficiency and varicosities stemming from the small saphenous vein.  He is now 1 week s/p endovenous laser ablation.   He is doing well and reports only mild soreness in the posterior calf. He reports improved tiredness at the end of the day and decreasing bluish discoloration of teh foot when standing.   He is compliant with his compression hose and denies fever, shills, LE swelling, SOB or chest pain.      Past Medical History  Diagnosis Date  . GERD (gastroesophageal reflux disease)   . Fracture, clavicle 1981    LEFT CLAVICLE  . Back fracture 1988    L3 TO L 4  . Kidney stone 1967  . Diverticulitis 2002  . Asthma     Past Surgical History  Procedure Laterality Date  . Tonsillectomy  1951     AND ADENOIDS  . Exploratory laparotomy  1970    EXPLORATORY STOMACH SURGERY FOR TEAR IN STOMACH WALL  . Carpal tunnel release  1984    RIGHT   . Carpal tunnel release  1983    LEFT HAND  . Lithotripsy  1986  . Ankle surgery  1988    RIGHT FOOT WITH SCREWS  . Back surgery  1995    LOWER BACK  . Back surgery  1995    2ND BACK SURGERY LOWER BACK  . Rotator cuff repair  1997    RIGHT  . Rotator cuff repair  1997    LEFT  . Lithotripsy  2001  . Lower back injection  2002  . Lithotripsy  2005  . Lithotripsy  2008  . Ankle surgery  2008    SCREWS REMOVED FROM RIGHT FOOT  . Lower back injection  2011  . Knee arthroscopy  02/05/2011    Procedure: ARTHROSCOPY KNEE;  Surgeon: Laurice Record [place];  Location: [facility];  Service: Orthopedics;  Laterality: Right;  Right knee arthroscopy partial lateral and medial meniscectomy with condyle debridement  . Cystoscopy/retrograde/ureteroscopy/stone extraction with basket Bilateral 10/10/2013    Procedure: CYSTOSCOPY/URETEROSCOPY/STONE EXTRACTION WITH HOLMIUM  LASER, bilateral retrograde, bilateral ureteral stents;  Surgeon: Jorja Loa, MD;  Location: [facility];  Service: Urology;  Laterality: Bilateral;  . Holmium laser application Left 2/54/2706    Procedure: HOLMIUM LASER APPLICATION;  Surgeon: Jorja Loa, MD;  Location: [facility];  Service: Urology;  Laterality: Left;    Allergies: Zithromax  Medications: Prior to Admission medications   Medication Sig Start Date End Date Taking? Authorizing Provider  albuterol (PROVENTIL HFA;VENTOLIN HFA) 108 (90 BASE) MCG/ACT inhaler Inhale 2 puffs into the lungs every 4 (four) hours as needed for wheezing or shortness of breath. Wheezing     Historical Provider, MD  APPLE CIDER VINEGAR PO Take 45 mLs by mouth daily.    Historical Provider, MD  atorvastatin (LIPITOR) 20 MG tablet Take 20 mg by mouth at bedtime.     Historical Provider, MD  cephALEXin (KEFLEX) 500 MG capsule Take 1 capsule (500 mg total) by mouth 2 (two) times daily. 10/10/13   Jorja Loa, MD  Fluticasone-Salmeterol (ADVAIR) 250-50 MCG/DOSE AEPB Inhale 1 puff into the lungs 2 (two) times daily.     Historical Provider, MD  Garlic 237 MG CAPS Take 600 mg by mouth daily.    Historical Provider, MD  Misc Natural Products (GLUCOS-CHONDROIT-MSM COMPLEX PO) Take  1 tablet by mouth daily.     Historical Provider, MD  Misc Natural Products (WHITE WILLOW BARK PO) Take 400 mg by mouth daily.    Historical Provider, MD  Multiple Vitamins-Minerals (MULTIVITAMINS THER. W/MINERALS) TABS Take 1 tablet by mouth daily.     Historical Provider, MD  naproxen sodium (ANAPROX) 220 MG tablet Take 440 mg by mouth 2 (two) times daily as needed (pain).    Historical Provider, MD  OVER THE COUNTER MEDICATION Take 2-3 tablets by mouth daily. Papaya complex enzyme    Historical Provider, MD  OVER THE COUNTER MEDICATION Take 1 tablet by mouth 3 (three) times daily before meals. Deglycyrrhizinated-DGL    Historical Provider, MD  oxybutynin (DITROPAN) 5  MG tablet Take 1 tablet (5 mg total) by mouth 3 (three) times daily. 10/10/13   Jorja Loa, MD  oxyCODONE-acetaminophen (ROXICET) 5-325 MG per tablet Take 1 tablet by mouth every 4 (four) hours as needed for severe pain. 10/10/13   Jorja Loa, MD  Probiotic Product (PROBIOTIC FORMULA PO) Take 1 capsule by mouth daily.     Historical Provider, MD  vitamin E 400 UNIT capsule Take 400 Units by mouth daily.     Historical Provider, MD    Family History  Problem Relation Age of Onset  . Heart attack Father     History   Social History  . Marital Status: Married    Spouse Name: N/A    Number of Children: N/A  . Years of Education: N/A   Social History Main Topics  . Smoking status: Never Smoker   . Smokeless tobacco: Never Used  . Alcohol Use: Yes     Comment: OCCASIONAL  . Drug Use: No  . Sexual Activity: Yes   Other Topics Concern  . Not on file   Social History Narrative  . No narrative on file    Review of Systems: A 12 point ROS discussed and pertinent positives are indicated in the HPI above.  All other systems are negative.  Review of Systems  Vital Signs: There were no vitals taken for this visit.  Physical Exam  Constitutional: He is oriented to person, place, and time. He appears well-developed and well-nourished.  HENT:  Head: Normocephalic and atraumatic.  Eyes: No scleral icterus.  Pulmonary/Chest: Effort normal.  Neurological: He is alert and oriented to person, place, and time.  Skin: Skin is warm and dry. No bruising and no ecchymosis noted. No erythema.  Well healed access site posterior mid to lower calf.       Imaging: US Venous Img Lower Unilateral Left  01/04/2014   CLINICAL DATA:  68 year old male with symptomatic left lower extremity venous insufficiency and varicosities related to an incompetent small saphenous vein. Status post left small saphenous endovenous laser ablation on 12/27/2013. Scheduled 1 week followup evaluation.   EXAM: LOWER EXTREMITY VENOUS DUPLEX ULTRASOUND  TECHNIQUE: Gray-scale sonography with graded compression, as well as color Doppler and duplex ultrasound, were performed to evaluate the deep and superficial veins of both lower extremities. Spectral Doppler was utilized to evaluate flow at rest and with distal augmentation maneuvers. A complete superficial venous insufficiency exam was performed in the upright standing position. I personally performed the technical portion of the exam.  COMPARISON:  None.  FINDINGS: Deep Venous System:  Evaluation of the deep venous system including the common femoral, femoral, profunda femoral, popliteal and calf veins (where visible) demonstrate no evidence of deep venous thrombosis. The vessels are compressible and  demonstrate normal respiratory phasicity and response to augmentation. No evidence of the deep venous reflux.  Superficial Venous System:  SFJ: Patent and compressible.  SPJ: Saphenopopliteal junction remains patent and compressible.  SSV Prox: Successfully occluded and noncompressible  SSV Mid: Successfully occluded and noncompressible  SSV Distal: Successfully occluded and noncompressible to the level of the entry site. The most distal small saphenous vein remains patent and compressible.  Other: Venous varicosities are again noted. One prominent varicosity in the distal calf has thrombus extending into it and is noncompressible. A varicosity adjacent to the entry site is incompletely thrombosed and partially compressible. Additional venous varicosities in the mid and lower calf remain patent and compressible. There is a single patent perforator veins supplying some of the varicosities in the distal calf.  IMPRESSION: 1. No evidence of thrombosis in the deep venous system. 2. Successful occlusion of the small saphenous vein status post endovenous laser ablation. 3. There are some persistently patent superficial venous varicosities in the mid and lower calf along with  an associated perforating vein. These vessels may be amenable to percutaneous sclerotherapy if clinically warranted at the 1 month followup evaluation. Signed,  Criselda Peaches, MD  [facility]  [facility]   Electronically Signed   By: Jacqulynn Cadet M.D.   On: 01/04/2014 06:32   Ir Embo Venous Not [place] Roadmapping  12/27/2013   CLINICAL DATA:  EVLT OF GSV OF LEFT LEG. Left leg swelling.  EXAM: TRANSCATHETER ENDOVENOUS LASER OCCLUSION OF THE LEFT LESSER SAPHENOUS VEIN UNDER ULTRASOUND GUIDANCE  FLUOROSCOPY TIME:  None  ANESTHESIA/SEDATION: Valium, 5 mg p.o.  PROCEDURE: Tumescent: 200 cc  Laser: 600 micron, 10 Watts power  Access: Lesser saphenous vein in the distal calf  Catheter tip: 2.3 cm from the saphenopopliteal junction.  Length of segment treated: 23 cm  Pull back time: 133  Total energy: 1063.6 joules  The procedure, risks, benefits, and alternatives were explained to the patient. Questions regarding the procedure were encouraged and answered. The patient understands and consents to the procedure.  Initial mapping of the right lower extremity was performed and course of the great saphenous vein mapped including level of the saphenofemoral junction and major tributaries supplying varicosities.  The calf was prepped with Betadine in a sterile fashion, and a sterile split drape was applied covering the operative field. A sterile gown and sterile gloves were used for the procedure.  Under sonographic guidance, a micropuncture needle was inserted into the lesser saphenous vein at the level of distal calf, which was noted to be patent. It was removed over and 018 wire which was upsized to an 035 J- wire utilizing the micropuncture set. This was advanced across the saphenopopliteal junction into the deep venous system. A 4-French sheath was inserted over the wire to the saphenofemoral junction. The Angiodynamics 600 micron laser was  inserted into the sheath, and was positioned with its tip 2.0 cm from the saphenofemoral junction.  Tumescent anesthesia was instilled along the left lesser saphenous vein providing a 1 cm buffer. Prior to laser occlusion, distance from the tip of the laser fiber to the saphenofemoral junction was remeasured. Laser mediated occlusion was then performed. The sheath was then removed. A compressive dressing was applied. A 20-30 mm Hg graded compression stocking was immediately applied post procedure. The patient ambulated prior to discharge.  FINDINGS: Prior to laser treatment, the laser fiber distance from the saphenopopliteal junction was documented to be 2 cm.  COMPLICATIONS: None  IMPRESSION: Successful endovenous laser occlusion of the left lesser saphenous vein to treat symptomatic venous insufficiency.   Electronically Signed   By: Maryclare Bean M.D.   On: 12/27/2013 16:52    Labs:  CBC:  Recent Labs  07/13/13 2203 09/30/13 1115  WBC 8.9 7.3  HGB 13.5 13.4  HCT 38.5* 38.9*  PLT 225 304    COAGS:  Recent Labs  07/13/13 2203  INR 1.02  APTT 28    BMP:  Recent Labs  07/13/13 2203 09/30/13 1115  NA 138 141  K 4.0 4.7  CL 100 105  CO2 22 25  GLUCOSE 92 101*  BUN 12 12  CALCIUM 9.3 9.7  CREATININE 0.89 0.90  GFRNONAA 85* 85*  GFRAA >90 >90    LIVER FUNCTION TESTS: No results found for this basename: BILITOT, AST, ALT, ALKPHOS, PROT, ALBUMIN,  in the last 8760 hours  TUMOR MARKERS: No results found for this basename: AFPTM, CEA, CA199, CHROMGRNA,  in the last 8760 hours  Assessment and Plan:  Doing very well 1 week post EVLT of the left SSV.  There are a few small superficial varicosities that remain patent and may be amenable to sclerotherapy if clinically warranted at the 1 month follow-up visit.   - Continue daily use of compression hose   Signed,  Criselda Peaches, MD    I spent a total of 20 minutes face to face in clinical consultation, greater than  50% of which was counseling/coordinating care for post endovenous ablation care.   SignedCriselda Peaches 01/04/2014, 5:56 PM

## 2014-01-19 ENCOUNTER — Ambulatory Visit
Admission: RE | Admit: 2014-01-19 | Discharge: 2014-01-19 | Disposition: A | Discharge status: Home / Self Care | Payer: Medicare Other | Source: Ambulatory Visit | Attending: Interventional Radiology | Admitting: Interventional Radiology

## 2014-01-19 DIAGNOSIS — I83812 Varicose veins of left lower extremities with pain: Secondary | ICD-10-CM

## 2014-01-19 NOTE — Progress Notes (Signed)
Patient ID: Andrew Moran, male   DOB: 02/28/1946, 68 y.o.   MRN: [id number]    History of Present Illness: Andrew Moran is a 68 y.o. male who is one month status post laser ablation of the left lesser saphenous vein. His symptoms are markedly improved. This includes edema and aching in his calf and ankle. He denies fevers or chills. He denies skin breakdown.  Past Medical History  Diagnosis Date  . GERD (gastroesophageal reflux disease)   . Fracture, clavicle 1981    LEFT CLAVICLE  . Back fracture 1988    L3 TO L 4  . Kidney stone 1967  . Diverticulitis 2002  . Asthma     Past Surgical History  Procedure Laterality Date  . Tonsillectomy  1951     AND ADENOIDS  . Exploratory laparotomy  1970    EXPLORATORY STOMACH SURGERY FOR TEAR IN STOMACH WALL  . Carpal tunnel release  1984    RIGHT   . Carpal tunnel release  1983    LEFT HAND  . Lithotripsy  1986  . Ankle surgery  1988    RIGHT FOOT WITH SCREWS  . Back surgery  1995    LOWER BACK  . Back surgery  1995    2ND BACK SURGERY LOWER BACK  . Rotator cuff repair  1997    RIGHT  . Rotator cuff repair  1997    LEFT  . Lithotripsy  2001  . Lower back injection  2002  . Lithotripsy  2005  . Lithotripsy  2008  . Ankle surgery  2008    SCREWS REMOVED FROM RIGHT FOOT  . Lower back injection  2011  . Knee arthroscopy  02/05/2011    Procedure: ARTHROSCOPY KNEE;  Surgeon: Laurice Record [place];  Location: [facility];  Service: Orthopedics;  Laterality: Right;  Right knee arthroscopy partial lateral and medial meniscectomy with condyle debridement  . Cystoscopy/retrograde/ureteroscopy/stone extraction with basket Bilateral 10/10/2013    Procedure: CYSTOSCOPY/URETEROSCOPY/STONE EXTRACTION WITH HOLMIUM LASER, bilateral retrograde, bilateral ureteral stents;  Surgeon: Jorja Loa, MD;  Location: [facility];  Service: Urology;  Laterality: Bilateral;  . Holmium laser application Left 07/23/3265    Procedure: HOLMIUM LASER APPLICATION;   Surgeon: Jorja Loa, MD;  Location: [facility];  Service: Urology;  Laterality: Left;    Allergies: Zithromax  Medications: Prior to Admission medications   Medication Sig Start Date End Date Taking? Authorizing Provider  albuterol (PROVENTIL HFA;VENTOLIN HFA) 108 (90 BASE) MCG/ACT inhaler Inhale 2 puffs into the lungs every 4 (four) hours as needed for wheezing or shortness of breath. Wheezing     Historical Provider, MD  APPLE CIDER VINEGAR PO Take 45 mLs by mouth daily.    Historical Provider, MD  atorvastatin (LIPITOR) 20 MG tablet Take 20 mg by mouth at bedtime.     Historical Provider, MD  cephALEXin (KEFLEX) 500 MG capsule Take 1 capsule (500 mg total) by mouth 2 (two) times daily. 10/10/13   Jorja Loa, MD  Fluticasone-Salmeterol (ADVAIR) 250-50 MCG/DOSE AEPB Inhale 1 puff into the lungs 2 (two) times daily.     Historical Provider, MD  Garlic 124 MG CAPS Take 600 mg by mouth daily.    Historical Provider, MD  Misc Natural Products (GLUCOS-CHONDROIT-MSM COMPLEX PO) Take 1 tablet by mouth daily.     Historical Provider, MD  Misc Natural Products (WHITE WILLOW BARK PO) Take 400 mg by mouth daily.    Historical Provider, MD  Multiple Vitamins-Minerals (MULTIVITAMINS  THER. W/MINERALS) TABS Take 1 tablet by mouth daily.     Historical Provider, MD  naproxen sodium (ANAPROX) 220 MG tablet Take 440 mg by mouth 2 (two) times daily as needed (pain).    Historical Provider, MD  OVER THE COUNTER MEDICATION Take 2-3 tablets by mouth daily. Papaya complex enzyme    Historical Provider, MD  OVER THE COUNTER MEDICATION Take 1 tablet by mouth 3 (three) times daily before meals. Deglycyrrhizinated-DGL    Historical Provider, MD  oxybutynin (DITROPAN) 5 MG tablet Take 1 tablet (5 mg total) by mouth 3 (three) times daily. 10/10/13   Jorja Loa, MD  oxyCODONE-acetaminophen (ROXICET) 5-325 MG per tablet Take 1 tablet by mouth every 4 (four) hours as needed for severe pain. 10/10/13    Jorja Loa, MD  Probiotic Product (PROBIOTIC FORMULA PO) Take 1 capsule by mouth daily.     Historical Provider, MD  vitamin E 400 UNIT capsule Take 400 Units by mouth daily.     Historical Provider, MD    Family History  Problem Relation Age of Onset  . Heart attack Father     History   Social History  . Marital Status: Married    Spouse Name: N/A    Number of Children: N/A  . Years of Education: N/A   Social History Main Topics  . Smoking status: Never Smoker   . Smokeless tobacco: Never Used  . Alcohol Use: Yes     Comment: OCCASIONAL  . Drug Use: No  . Sexual Activity: Yes   Other Topics Concern  . Not on file   Social History Narrative  . No narrative on file     Review of Systems: A 12 point ROS discussed and pertinent positives are indicated in the HPI above.  All other systems are negative.  Review of Systems  Vital Signs: There were no vitals taken for this visit.  Physical Exam  Musculoskeletal:  Examination of the left calf demonstrates that the laser entry site is clean and dry without signs of infection. There is no edema or skin breakdown. No significant ecchymosis. Pulses are intact distally .    Imaging: US Venous Img Lower Unilateral Left  01/19/2014   CLINICAL DATA:  One month status post laser ablation of the left lesser saphenous vein  EXAM: LEFT LOWER EXTREMITY VENOUS DUPLEX ULTRASOUND  TECHNIQUE: Doppler venous assessment of the left lower extremity deep venous system was performed, including characterization of spectral flow, compressibility, and phasicity.  COMPARISON:  None.  FINDINGS: There is complete compressibility of the left common femoral, femoral, and popliteal veins. Doppler analysis demonstrates respiratory phasicity and augmentation of flow upon calf compression.  The lesser saphenous vein is thrombosed. Varicosities in the calf are less prominent but remain compressible and patent.  IMPRESSION: No evidence of DVT.   Successful occlusion of the lesser saphenous vein.  Varicosities remain compressible and patent.   Electronically Signed   By: Maryclare Bean M.D.   On: 01/19/2014 13:55   US Venous Img Lower Unilateral Left  01/04/2014   CLINICAL DATA:  68 year old male with symptomatic left lower extremity venous insufficiency and varicosities related to an incompetent small saphenous vein. Status post left small saphenous endovenous laser ablation on 12/27/2013. Scheduled 1 week followup evaluation.  EXAM: LOWER EXTREMITY VENOUS DUPLEX ULTRASOUND  TECHNIQUE: Gray-scale sonography with graded compression, as well as color Doppler and duplex ultrasound, were performed to evaluate the deep and superficial veins of both lower extremities. Spectral Doppler  was utilized to evaluate flow at rest and with distal augmentation maneuvers. A complete superficial venous insufficiency exam was performed in the upright standing position. I personally performed the technical portion of the exam.  COMPARISON:  None.  FINDINGS: Deep Venous System:  Evaluation of the deep venous system including the common femoral, femoral, profunda femoral, popliteal and calf veins (where visible) demonstrate no evidence of deep venous thrombosis. The vessels are compressible and demonstrate normal respiratory phasicity and response to augmentation. No evidence of the deep venous reflux.  Superficial Venous System:  SFJ: Patent and compressible.  SPJ: Saphenopopliteal junction remains patent and compressible.  SSV Prox: Successfully occluded and noncompressible  SSV Mid: Successfully occluded and noncompressible  SSV Distal: Successfully occluded and noncompressible to the level of the entry site. The most distal small saphenous vein remains patent and compressible.  Other: Venous varicosities are again noted. One prominent varicosity in the distal calf has thrombus extending into it and is noncompressible. A varicosity adjacent to the entry site is incompletely  thrombosed and partially compressible. Additional venous varicosities in the mid and lower calf remain patent and compressible. There is a single patent perforator veins supplying some of the varicosities in the distal calf.  IMPRESSION: 1. No evidence of thrombosis in the deep venous system. 2. Successful occlusion of the small saphenous vein status post endovenous laser ablation. 3. There are some persistently patent superficial venous varicosities in the mid and lower calf along with an associated perforating vein. These vessels may be amenable to percutaneous sclerotherapy if clinically warranted at the 1 month followup evaluation. Signed,  Criselda Peaches, MD  [facility]  [facility]   Electronically Signed   By: Jacqulynn Cadet M.D.   On: 01/04/2014 06:32   Ir Embo Venous Not [place] Roadmapping  12/27/2013   CLINICAL DATA:  EVLT OF GSV OF LEFT LEG. Left leg swelling.  EXAM: TRANSCATHETER ENDOVENOUS LASER OCCLUSION OF THE LEFT LESSER SAPHENOUS VEIN UNDER ULTRASOUND GUIDANCE  FLUOROSCOPY TIME:  None  ANESTHESIA/SEDATION: Valium, 5 mg p.o.  PROCEDURE: Tumescent: 200 cc  Laser: 600 micron, 10 Watts power  Access: Lesser saphenous vein in the distal calf  Catheter tip: 2.3 cm from the saphenopopliteal junction.  Length of segment treated: 23 cm  Pull back time: 133  Total energy: 1063.6 joules  The procedure, risks, benefits, and alternatives were explained to the patient. Questions regarding the procedure were encouraged and answered. The patient understands and consents to the procedure.  Initial mapping of the right lower extremity was performed and course of the great saphenous vein mapped including level of the saphenofemoral junction and major tributaries supplying varicosities.  The calf was prepped with Betadine in a sterile fashion, and a sterile split drape was applied covering the operative field. A sterile gown and sterile  gloves were used for the procedure.  Under sonographic guidance, a micropuncture needle was inserted into the lesser saphenous vein at the level of distal calf, which was noted to be patent. It was removed over and 018 wire which was upsized to an 035 J- wire utilizing the micropuncture set. This was advanced across the saphenopopliteal junction into the deep venous system. A 4-French sheath was inserted over the wire to the saphenofemoral junction. The Angiodynamics 600 micron laser was inserted into the sheath, and was positioned with its tip 2.0 cm from the saphenofemoral junction.  Tumescent anesthesia was instilled along the left lesser saphenous vein providing a  1 cm buffer. Prior to laser occlusion, distance from the tip of the laser fiber to the saphenofemoral junction was remeasured. Laser mediated occlusion was then performed. The sheath was then removed. A compressive dressing was applied. A 20-30 mm Hg graded compression stocking was immediately applied post procedure. The patient ambulated prior to discharge.  FINDINGS: Prior to laser treatment, the laser fiber distance from the saphenopopliteal junction was documented to be 2 cm.  COMPLICATIONS: None  IMPRESSION: Successful endovenous laser occlusion of the left lesser saphenous vein to treat symptomatic venous insufficiency.   Electronically Signed   By: Maryclare Bean M.D.   On: 12/27/2013 16:52    Labs:  CBC:  Recent Labs  07/13/13 2203 09/30/13 1115  WBC 8.9 7.3  HGB 13.5 13.4  HCT 38.5* 38.9*  PLT 225 304    COAGS:  Recent Labs  07/13/13 2203  INR 1.02  APTT 28    BMP:  Recent Labs  07/13/13 2203 09/30/13 1115  NA 138 141  K 4.0 4.7  CL 100 105  CO2 22 25  GLUCOSE 92 101*  BUN 12 12  CALCIUM 9.3 9.7  CREATININE 0.89 0.90  GFRNONAA 85* 85*  GFRAA >90 >90    LIVER FUNCTION TESTS: No results for input(s): BILITOT, AST, ALT, ALKPHOS, PROT, ALBUMIN in the last 8760 hours.  TUMOR MARKERS: No results for  input(s): AFPTM, CEA, CA199, CHROMGRNA in the last 8760 hours.  Assessment and Plan:  In summary, Mr. Cimo has done very well and his left lesser saphenous vein is thrombosed. Varicosities remain patent. Although his symptoms have improved, I have recommended sclerotherapy of the varicosities. He wishes to perform this in one month.       Signed: Welcome Fults,ART A 01/19/2014, 2:12 PM

## 2014-02-21 ENCOUNTER — Encounter (INDEPENDENT_AMBULATORY_CARE_PROVIDER_SITE_OTHER): Payer: Self-pay

## 2014-02-21 ENCOUNTER — Other Ambulatory Visit (HOSPITAL_COMMUNITY): Payer: Self-pay | Admitting: Interventional Radiology

## 2014-02-21 ENCOUNTER — Ambulatory Visit
Admission: RE | Admit: 2014-02-21 | Discharge: 2014-02-21 | Disposition: A | Discharge status: Home / Self Care | Payer: Medicare Other | Source: Ambulatory Visit | Attending: Interventional Radiology | Admitting: Interventional Radiology

## 2014-02-21 DIAGNOSIS — I83812 Varicose veins of left lower extremities with pain: Secondary | ICD-10-CM

## 2014-03-23 ENCOUNTER — Encounter (INDEPENDENT_AMBULATORY_CARE_PROVIDER_SITE_OTHER): Payer: Self-pay

## 2014-03-23 ENCOUNTER — Ambulatory Visit
Admission: RE | Admit: 2014-03-23 | Discharge: 2014-03-23 | Disposition: A | Discharge status: Home / Self Care | Payer: Medicare Other | Source: Ambulatory Visit | Attending: Interventional Radiology | Admitting: Interventional Radiology

## 2014-03-23 DIAGNOSIS — I83812 Varicose veins of left lower extremities with pain: Secondary | ICD-10-CM

## 2014-03-23 NOTE — Progress Notes (Signed)
Patient ID: LETCHER Moran, male   DOB: 24-Nov-1945, 69 y.o.   MRN: [id number]   Chief Complaint: No chief complaint on file.   Referring Physician(s): Sarim Rothman,Art A  History of Present Illness: Andrew Moran is a 69 y.o. male who returns 1 month after sclerotherapy of varicosities in the left calf. He denies any fever or chills. He denies any signs of infection at the sclerotherapy site. Overall, he continues to enjoy improved symptoms and improved swelling of the left calf.  Past Medical History  Diagnosis Date  . GERD (gastroesophageal reflux disease)   . Fracture, clavicle 1981    LEFT CLAVICLE  . Back fracture 1988    L3 TO L 4  . Kidney stone 1967  . Diverticulitis 2002  . Asthma     Past Surgical History  Procedure Laterality Date  . Tonsillectomy  1951     AND ADENOIDS  . Exploratory laparotomy  1970    EXPLORATORY STOMACH SURGERY FOR TEAR IN STOMACH WALL  . Carpal tunnel release  1984    RIGHT   . Carpal tunnel release  1983    LEFT HAND  . Lithotripsy  1986  . Ankle surgery  1988    RIGHT FOOT WITH SCREWS  . Back surgery  1995    LOWER BACK  . Back surgery  1995    2ND BACK SURGERY LOWER BACK  . Rotator cuff repair  1997    RIGHT  . Rotator cuff repair  1997    LEFT  . Lithotripsy  2001  . Lower back injection  2002  . Lithotripsy  2005  . Lithotripsy  2008  . Ankle surgery  2008    SCREWS REMOVED FROM RIGHT FOOT  . Lower back injection  2011  . Knee arthroscopy  02/05/2011    Procedure: ARTHROSCOPY KNEE;  Surgeon: Laurice Record [place];  Location: [facility];  Service: Orthopedics;  Laterality: Right;  Right knee arthroscopy partial lateral and medial meniscectomy with condyle debridement  . Cystoscopy/retrograde/ureteroscopy/stone extraction with basket Bilateral 10/10/2013    Procedure: CYSTOSCOPY/URETEROSCOPY/STONE EXTRACTION WITH HOLMIUM LASER, bilateral retrograde, bilateral ureteral stents;  Surgeon: Jorja Loa, MD;  Location: [facility];  Service:  Urology;  Laterality: Bilateral;  . Holmium laser application Left 06/07/4008    Procedure: HOLMIUM LASER APPLICATION;  Surgeon: Jorja Loa, MD;  Location: [facility];  Service: Urology;  Laterality: Left;    Allergies: Zithromax  Medications: Prior to Admission medications   Medication Sig Start Date End Date Taking? Authorizing Provider  albuterol (PROVENTIL HFA;VENTOLIN HFA) 108 (90 BASE) MCG/ACT inhaler Inhale 2 puffs into the lungs every 4 (four) hours as needed for wheezing or shortness of breath. Wheezing     Historical Provider, MD  APPLE CIDER VINEGAR PO Take 45 mLs by mouth daily.    Historical Provider, MD  atorvastatin (LIPITOR) 20 MG tablet Take 20 mg by mouth at bedtime.     Historical Provider, MD  cephALEXin (KEFLEX) 500 MG capsule Take 1 capsule (500 mg total) by mouth 2 (two) times daily. 10/10/13   Jorja Loa, MD  Fluticasone-Salmeterol (ADVAIR) 250-50 MCG/DOSE AEPB Inhale 1 puff into the lungs 2 (two) times daily.     Historical Provider, MD  Garlic 272 MG CAPS Take 600 mg by mouth daily.    Historical Provider, MD  Misc Natural Products (GLUCOS-CHONDROIT-MSM COMPLEX PO) Take 1 tablet by mouth daily.     Historical Provider, MD  Misc Natural Products (Osborne  PO) Take 400 mg by mouth daily.    Historical Provider, MD  Multiple Vitamins-Minerals (MULTIVITAMINS THER. W/MINERALS) TABS Take 1 tablet by mouth daily.     Historical Provider, MD  naproxen sodium (ANAPROX) 220 MG tablet Take 440 mg by mouth 2 (two) times daily as needed (pain).    Historical Provider, MD  OVER THE COUNTER MEDICATION Take 2-3 tablets by mouth daily. Papaya complex enzyme    Historical Provider, MD  OVER THE COUNTER MEDICATION Take 1 tablet by mouth 3 (three) times daily before meals. Deglycyrrhizinated-DGL    Historical Provider, MD  oxybutynin (DITROPAN) 5 MG tablet Take 1 tablet (5 mg total) by mouth 3 (three) times daily. 10/10/13   Jorja Loa, MD    oxyCODONE-acetaminophen (ROXICET) 5-325 MG per tablet Take 1 tablet by mouth every 4 (four) hours as needed for severe pain. 10/10/13   Jorja Loa, MD  Probiotic Product (PROBIOTIC FORMULA PO) Take 1 capsule by mouth daily.     Historical Provider, MD  vitamin E 400 UNIT capsule Take 400 Units by mouth daily.     Historical Provider, MD    Family History  Problem Relation Age of Onset  . Heart attack Father     History   Social History  . Marital Status: Married    Spouse Name: N/A    Number of Children: N/A  . Years of Education: N/A   Social History Main Topics  . Smoking status: Never Smoker   . Smokeless tobacco: Never Used  . Alcohol Use: Yes     Comment: OCCASIONAL  . Drug Use: No  . Sexual Activity: Yes   Other Topics Concern  . Not on file   Social History Narrative  . No narrative on file     Review of Systems: A 12 point ROS discussed and pertinent positives are indicated in the HPI above.  All other systems are negative.  Review of Systems  Vital Signs: There were no vitals taken for this visit.  Physical Exam  Imaging: [place] Injec Sclerotherapy Single  02/21/2014   CLINICAL DATA:  There are compressible varicosities in the lower posterior left calf after laser occlusion of the left lesser saphenous vein. The patient continues to do well with improvement of his symptoms.  EXAM: ULTRASOUND INJECTION SCLEROTHERAPHY SINGLE  FLUOROSCOPY TIME:  None  MEDICATIONS AND MEDICAL HISTORY: None  ANESTHESIA/SEDATION: None  CONTRAST:  None  PROCEDURE: The procedure, risks, benefits, and alternatives were explained to the patient. Questions regarding the procedure were encouraged and answered. The patient understands and consents to the procedure.  The posterior calf was prepped with multiple alcohol swabs in a sterile fashion.  Under sonographic guidance, foam sclerotherapy using Sotradecol was injected into a varicosity in the calf posteriorly and inferiorly.  Sonographic evaluation demonstrates adequate deposition of sclerosis in throughout the varicosities. A dressing was applied. Compression stockings were replaced. The patient ambulated 5 min prior to discharge in stable condition.  FINDINGS: The image confirms sclerotherapy in varicosities within the left calf.  COMPLICATIONS: None  IMPRESSION: Successful foam sclerotherapy for varicosities in the left calf. The patient will followup in 1 month.   Electronically Signed   By: Maryclare Bean M.D.   On: 02/21/2014 08:55    Ultrasound today shows complete thrombosis of all of the calf varicosities. The left lesser saphenous vein continues to be thrombosed.  Labs:  CBC:  Recent Labs  07/13/13 2203 09/30/13 1115  WBC 8.9 7.3  HGB 13.5  13.4  HCT 38.5* 38.9*  PLT 225 304    COAGS:  Recent Labs  07/13/13 2203  INR 1.02  APTT 28    BMP:  Recent Labs  07/13/13 2203 09/30/13 1115  NA 138 141  K 4.0 4.7  CL 100 105  CO2 22 25  GLUCOSE 92 101*  BUN 12 12  CALCIUM 9.3 9.7  CREATININE 0.89 0.90  GFRNONAA 85* 85*  GFRAA >90 >90     Assessment and Plan:  Sclerotherapy performed 1 month ago has been successful, as there is now complete thrombosis of all of his varicosities. He has minimal pain and overall his symptoms are improved. He is to follow-up at the six-month interval.  Thank you for this interesting consult.  I greatly enjoyed meeting Andrew Moran and look forward to participating in their care.       Signed: Anjannette Gauger, ART A 03/23/2014, 8:33 AM

## 2014-05-29 ENCOUNTER — Other Ambulatory Visit (HOSPITAL_COMMUNITY): Payer: Self-pay | Admitting: Interventional Radiology

## 2014-05-29 ENCOUNTER — Telehealth: Payer: Self-pay | Admitting: *Deleted

## 2014-05-29 DIAGNOSIS — I872 Venous insufficiency (chronic) (peripheral): Secondary | ICD-10-CM

## 2014-05-29 NOTE — Telephone Encounter (Signed)
I sw pt to schedule 56mth f/u may be going to [place] to help take care of mom. Pt said he will c/b when he is ready to schedule.

## 2014-06-08 NOTE — Telephone Encounter (Signed)
errror

## 2015-08-31 ENCOUNTER — Ambulatory Visit: Payer: Self-pay | Admitting: General Surgery

## 2015-10-14 IMAGING — US US EXTREM LOW VENOUS*L*
1 series · 13 of 24 positions shown · non-contrast
Comparison: None.

CLINICAL DATA: Left leg pain and swelling, primarily involving the
posterior aspect of the calf, history of varicose veins, evaluate
for DVT



[Series 1: us extrem low venous*left* · 13 of 41 slices shown]
[im 1/41]
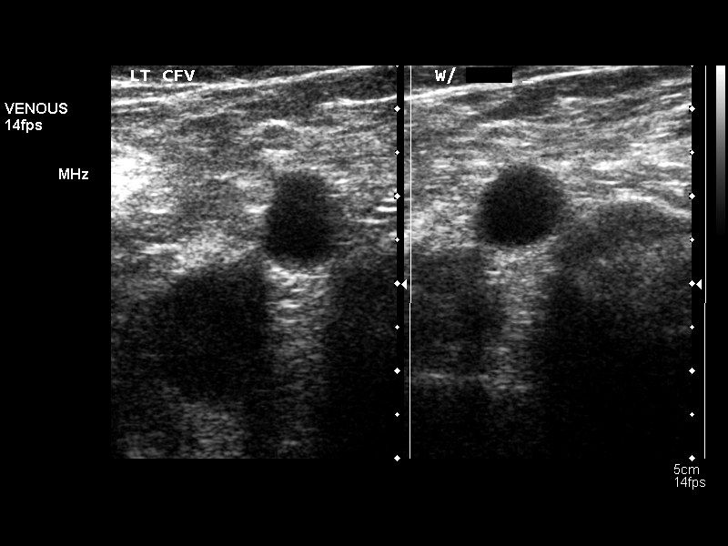
[im 4/41]
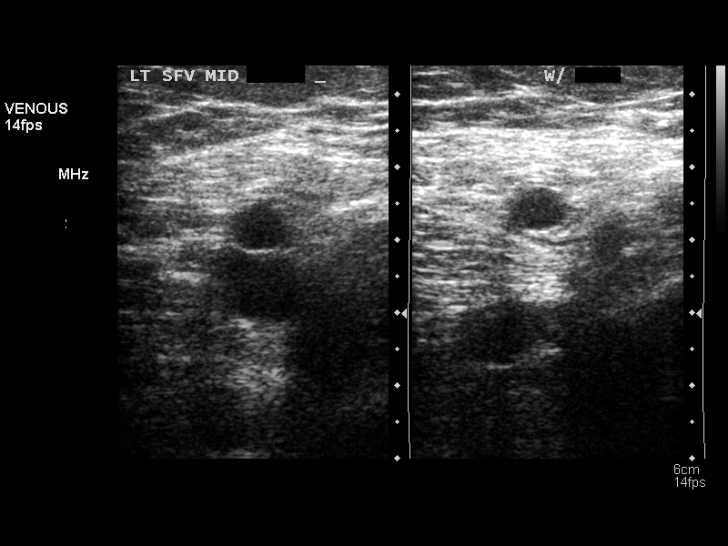
[im 7/41]
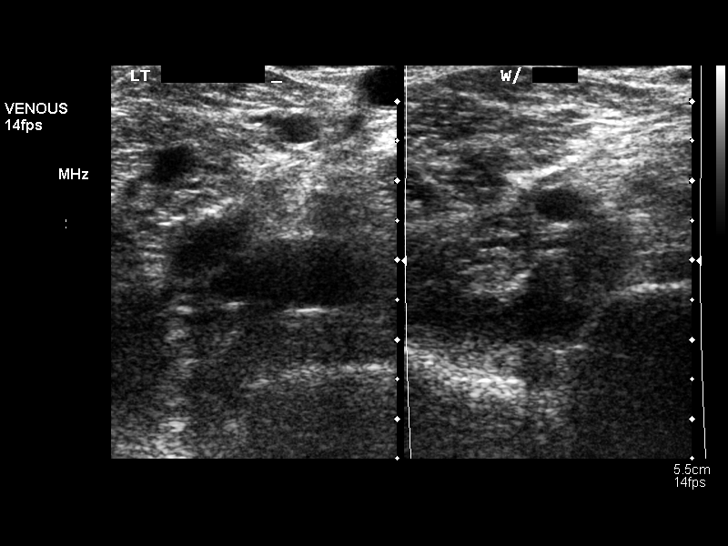
[im 11/41]
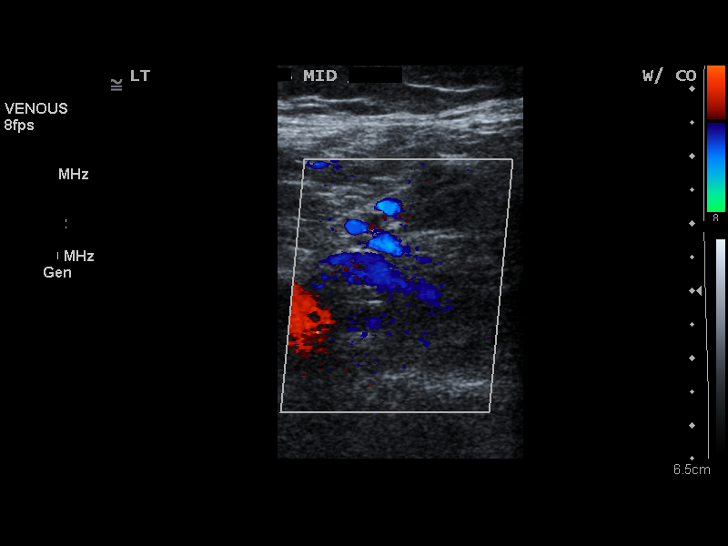
[im 14/41]
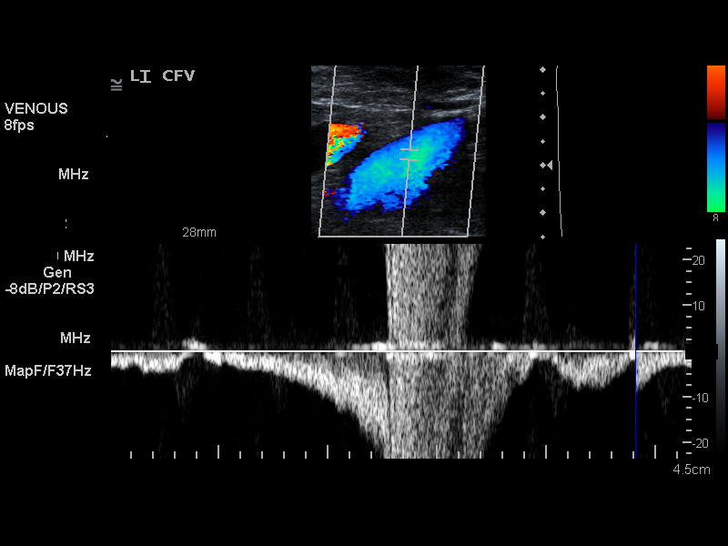
[im 18/41]
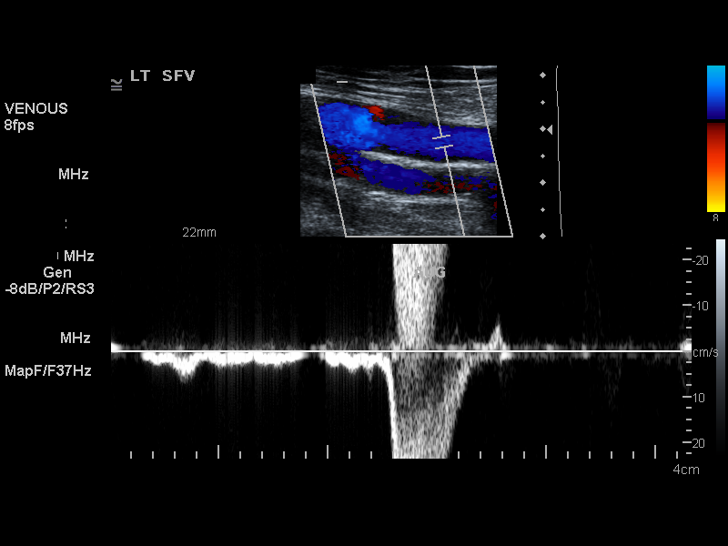
[im 21/41]
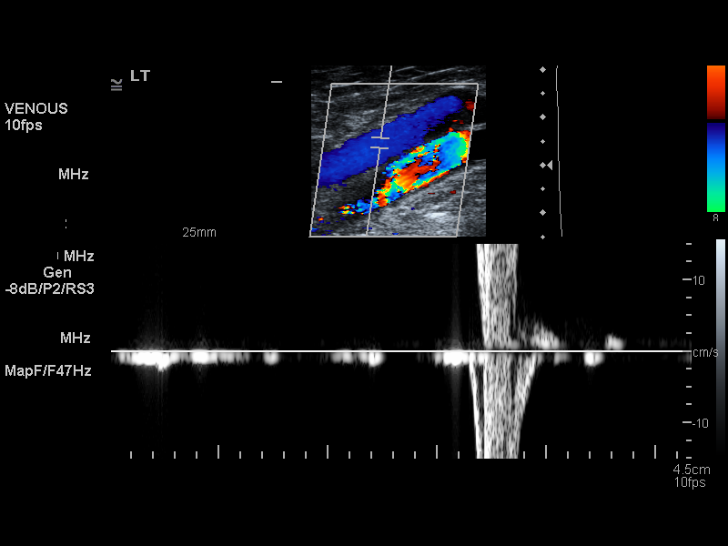
[im 23/41]
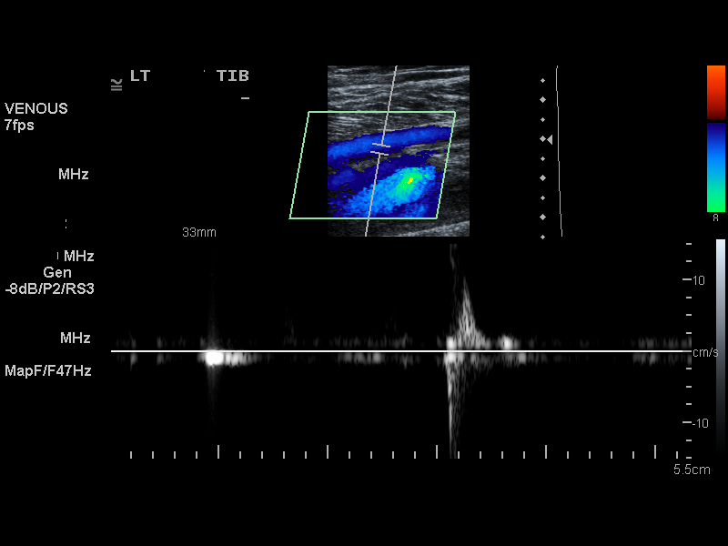
[im 27/41]
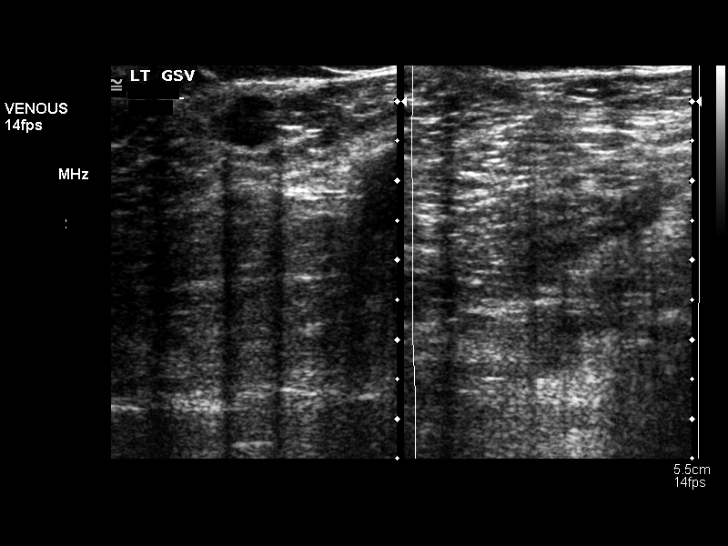
[im 30/41]
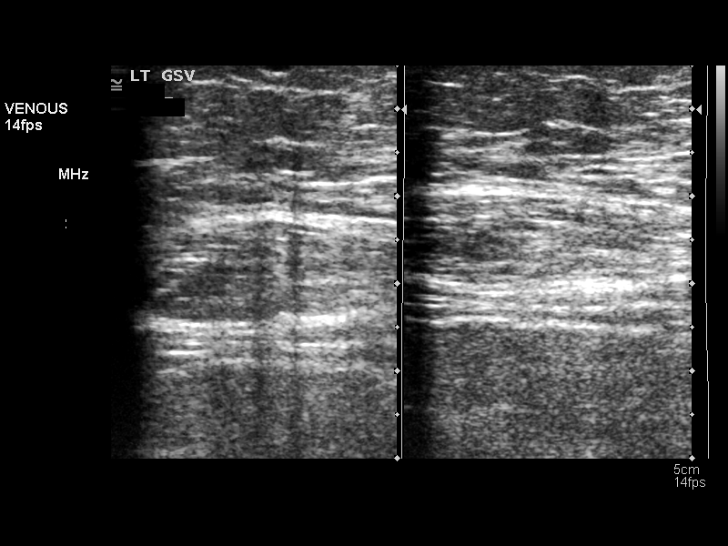
[im 34/41]
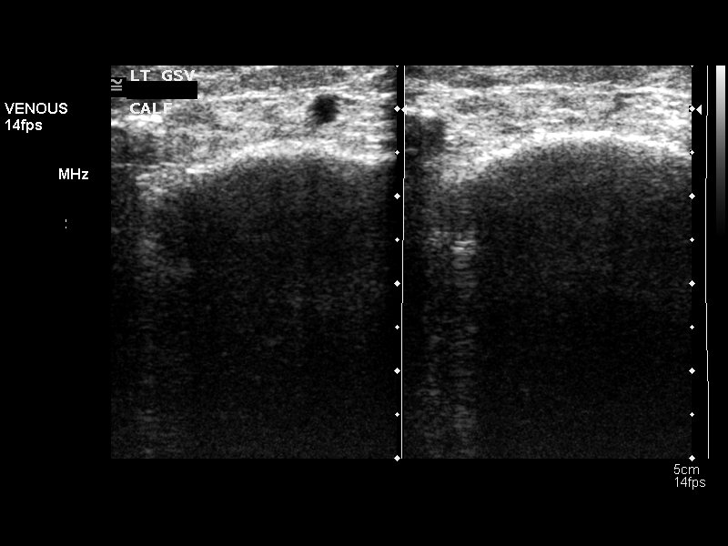
[im 37/41]
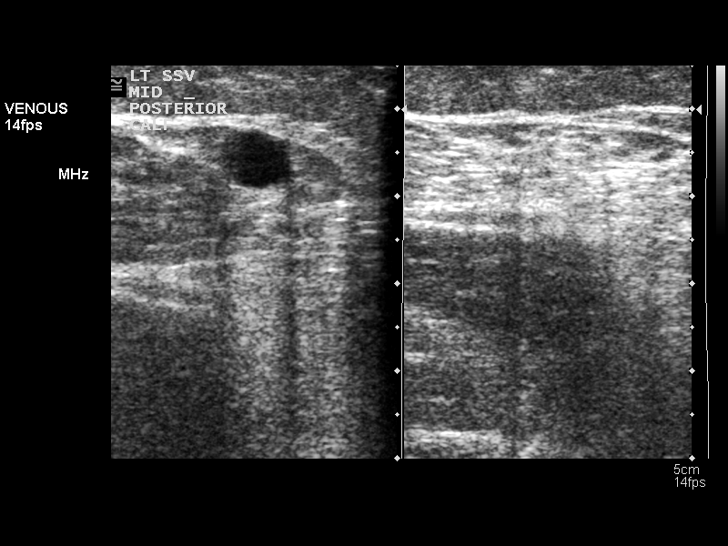
[im 41/41]
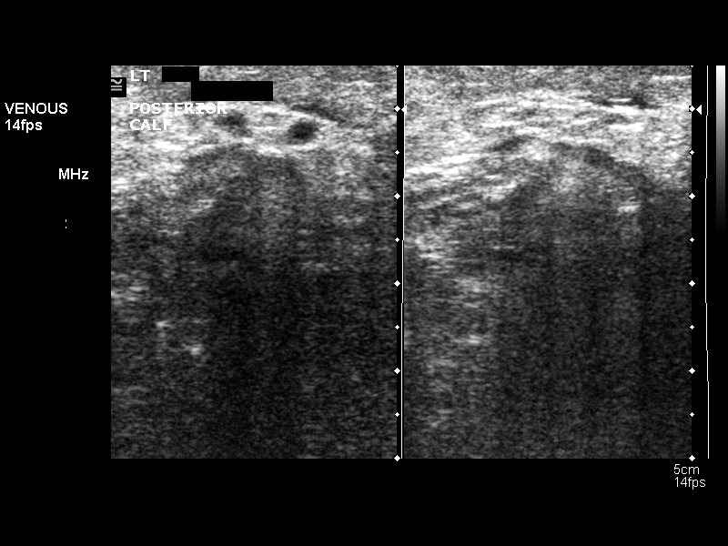

[13 of 24 positions shown; findings below may reference images not displayed]

FINDINGS: Common Femoral Vein: No evidence of thrombus. Normal
compressibility, respiratory phasicity and response to augmentation.

Saphenofemoral Junction: No evidence of thrombus. Normal
compressibility and flow on color Doppler imaging.

Profunda Femoral Vein: No evidence of thrombus. Normal
compressibility and flow on color Doppler imaging.

Femoral Vein: No evidence of thrombus. Normal compressibility,
respiratory phasicity and response to augmentation.

Popliteal Vein: No evidence of thrombus. Normal compressibility,
respiratory phasicity and response to augmentation.

Calf Veins: No evidence of thrombus. Normal compressibility and flow
on color Doppler imaging.

Superficial Great Saphenous Vein: No evidence of thrombus. Normal
compressibility and flow on color Doppler imaging. The lesser
saphenous vein appears dilated at the level of the popliteal space
(representative images 36 through 39) with filling of several
hypertrophied superficial venous varicosities (representative images
41 and 42).

Venous Reflux:  None.

Other Findings:  None.
IMPRESSION: 1. No evidence of DVT within the left lower extremity.
2. The left lesser saphenous vein is widely patent though mildly
dilated and supplies several varicosities at the level of the calf.
As these varicosities (and potential venous insufficiency) could
contribute to patient's lower extremity pain and swelling, further
evaluation could be performed with a formal venous evaluation
performed by [REDACTED] [REDACTED] ([phone]) as indicated.

## 2016-03-21 IMAGING — US IR EMBO VENOUS NOT [PERSON_NAME]  INC GUIDE ROADMAPPING
1 series · 2 of 2 positions shown · non-contrast
Comparison: none

CLINICAL DATA: EVLT OF GSV OF LEFT LEG. Left leg swelling.

[Series 1: ir embo venous not (person_name) inc guide roadmap · 0.08mm/px · 2 of 2 slices shown]
[im 1/2]
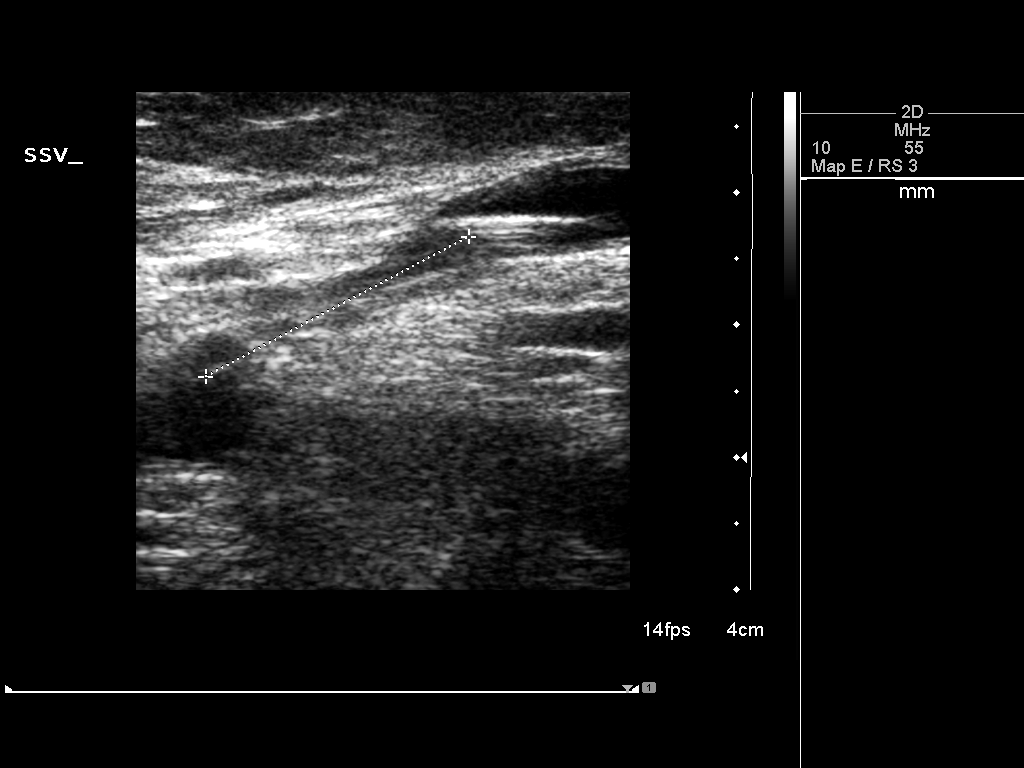
[im 2/2]
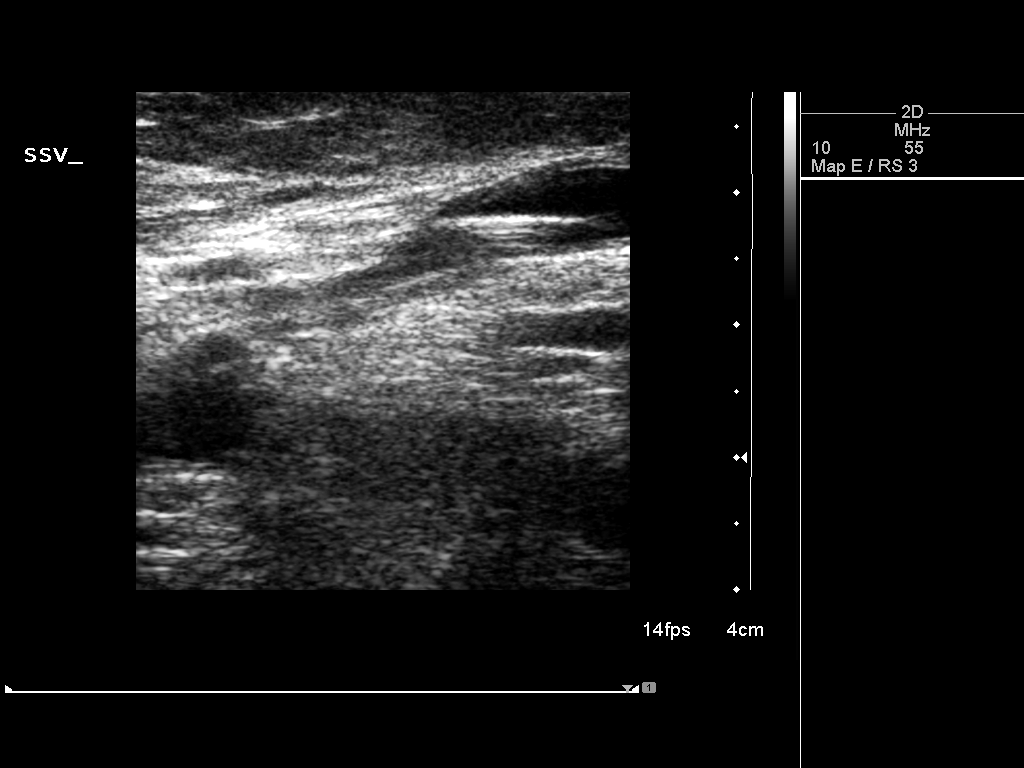

[2 of 2 positions shown; findings below may reference images not displayed]

EXAM:
TRANSCATHETER ENDOVENOUS LASER OCCLUSION OF THE LEFT LESSER
SAPHENOUS VEIN UNDER ULTRASOUND GUIDANCE

FLUOROSCOPY TIME:  None

ANESTHESIA/SEDATION:
Valium, 5 mg p.o..

PROCEDURE:
Tumescent: 200 cc

Laser: 600 micron, 10 Infinity power

Access: Lesser saphenous vein in the distal calf

Catheter tip: 2.3 cm from the saphenopopliteal junction.

Length of segment treated: 23 cm

Pull back time: 133

Total energy: 1063.6 joules

The procedure, risks, benefits, and alternatives were explained to
the patient. Questions regarding the procedure were encouraged and
answered. The patient understands and consents to the procedure.

Initial mapping of the right lower extremity was performed and
course of the great saphenous vein mapped including level of the
saphenofemoral junction and major tributaries supplying
varicosities.

The calf was prepped with Betadine in a sterile fashion, and a
sterile split drape was applied covering the operative field. A
sterile gown and sterile gloves were used for the procedure.

Under sonographic guidance, a micropuncture needle was inserted into
the lesser saphenous vein at the level of distal calf, which was
noted to be patent. It was removed over and 018 wire which was
upsized to an 035 J- wire utilizing the micropuncture set. This was
advanced across the saphenopopliteal junction into the deep venous
system. A 4-French sheath was inserted over the wire to the
saphenofemoral junction. The [employer] 600 micron laser was
inserted into the sheath, and was positioned with its tip 2.0 cm
from the saphenofemoral junction.

Tumescent anesthesia was instilled along the left lesser saphenous
vein providing a 1 cm buffer. Prior to laser occlusion, distance
from the tip of the laser fiber to the saphenofemoral junction was
remeasured. Laser mediated occlusion was then performed. The sheath
was then removed. A compressive dressing was applied. A 20-30 mm Hg
graded compression stocking was immediately applied post procedure.
The patient ambulated prior to discharge.
FINDINGS: Prior to laser treatment, the laser fiber distance from the
saphenopopliteal junction was documented to be 2 cm.

COMPLICATIONS:
None
IMPRESSION: Successful endovenous laser occlusion of the left lesser saphenous
vein to treat symptomatic venous insufficiency.

## 2016-05-30 ENCOUNTER — Ambulatory Visit
Admission: RE | Admit: 2016-05-30 | Discharge: 2016-05-30 | Disposition: A | Discharge status: Home / Self Care | Payer: Medicare Other | Source: Ambulatory Visit | Attending: Family Medicine | Admitting: Family Medicine

## 2016-05-30 ENCOUNTER — Other Ambulatory Visit: Payer: Self-pay | Admitting: Family Medicine

## 2016-05-30 DIAGNOSIS — R05 Cough: Secondary | ICD-10-CM

## 2016-05-30 DIAGNOSIS — R059 Cough, unspecified: Secondary | ICD-10-CM

## 2016-06-26 ENCOUNTER — Ambulatory Visit (INDEPENDENT_AMBULATORY_CARE_PROVIDER_SITE_OTHER): Payer: Medicare Other | Admitting: Internal Medicine

## 2016-06-26 ENCOUNTER — Encounter: Payer: Self-pay | Admitting: Internal Medicine

## 2016-06-26 VITALS — BP 122/66 | HR 77 | Ht 65.0 in | Wt 182.0 lb

## 2016-06-26 DIAGNOSIS — J45991 Cough variant asthma: Secondary | ICD-10-CM

## 2016-06-26 LAB — NITRIC OXIDE: Nitric Oxide: 18

## 2016-06-26 MED ORDER — BUDESONIDE-FORMOTEROL FUMARATE 80-4.5 MCG/ACT IN AERO: INHALATION_SPRAY | RESPIRATORY_TRACT | 11 refills | Status: DC

## 2016-06-26 MED ORDER — PANTOPRAZOLE SODIUM 40 MG PO TBEC: 40.0000 mg | DELAYED_RELEASE_TABLET | Freq: Every day | ORAL | 2 refills | Status: DC

## 2016-06-26 MED ORDER — BUDESONIDE-FORMOTEROL FUMARATE 80-4.5 MCG/ACT IN AERO: 2.0000 | INHALATION_SPRAY | Freq: Two times a day (BID) | RESPIRATORY_TRACT | 0 refills | Status: DC

## 2016-06-26 MED ORDER — FAMOTIDINE 20 MG PO TABS: ORAL_TABLET | ORAL | Status: DC

## 2016-06-26 NOTE — Patient Instructions (Addendum)
Stop advair   Plan A = Automatic = symbicort 80 Take 2 puffs first thing in am and then another 2 puffs about 12 hours later.    Work on inhaler technique:  relax and gently blow all the way out then take a nice smooth deep breath back in, triggering the inhaler at same time you start breathing in.  Hold for up to 5 seconds if you can. Blow out thru nose. Rinse and gargle with water when done      Plan B = Backup Only use your albuterol as a rescue medication to be used if you can't catch your breath by resting or doing a relaxed purse lip breathing pattern.  - The less you use it, the better it will work when you need it. - Ok to use the inhaler up to 2 puffs  every 4 hours if you must but call for appointment if use goes up over your usual need - Don't leave home without it !!  (think of it like the spare tire for your car)      GERD (REFLUX)  is an extremely common cause of respiratory symptoms just like yours , many times with no obvious heartburn at all.    It can be treated with medication, but also with lifestyle changes including elevation of the head of your bed (ideally with 6 inch  bed blocks),  Smoking cessation, avoidance of late meals, excessive alcohol, and avoid fatty foods, chocolate, peppermint, colas, red wine, and acidic juices such as orange juice.  NO MINT OR MENTHOL PRODUCTS SO NO COUGH DROPS   USE SUGARLESS CANDY INSTEAD (Jolley ranchers or Stover's or Life Savers) or even ice chips will also do - the key is to swallow to prevent all throat clearing. NO OIL BASED VITAMINS - use powdered substitutes.  Please schedule a follow up office visit in 4 weeks, sooner if needed  Add : Pantoprazole (protonix) 40 mg   Take  30-60 min before first meal of the day and Pepcid (famotidine)  20 mg one @  bedtime until return to office - this is the best way to tell whether stomach acid is contributing to your problem.

## 2016-06-26 NOTE — Progress Notes (Signed)
Subjective:    Patient ID: Andrew Moran, male    DOB: 22-Nov-1945,   MRN: [id number]  HPI   76 yowm never smoker dx as asthma as child growing up in [place] mostly in winter months very limited activity due to sob and summer better s limits improved in his 46s then recurred in his 37's on prn saba but in 2000's required daily treatment with advair but only helped a little and still needed freq saba and saw Dr Bernita Buffy with no resp to shots      06/26/2016 1st [facility] office visit/ [facility]   Chief Complaint  Patient presents with  . Advice Only    self referral for emphysema.  worsening sob, nonprod cough Xseveral months.   ? Chronic asthma seen here  By me and By Dr Joya Gaskins around 2006 but does not remember seeing [place] and whether this helped but now comes in with worsening dry cough / throat irritation and doe indolent onset gradually worse x 3 m esp p supper  But does ok sleeping at 45 degrees  s nasal symptoms at all some better p saba   No obvious other patterns to day to day or daytime variability or assoc excess/ purulent sputum or mucus plugs or hemoptysis or cp or chest tightness, subjective wheeze or overt sinus or hb symptoms. No unusual exp hx or h/o childhood pna/ asthma or knowledge of premature birth.  Sleeping ok without nocturnal  or early am exacerbation  of respiratory  c/o's or need for noct saba. Also denies any obvious fluctuation of symptoms with weather or environmental changes or other aggravating or alleviating factors except as outlined above   Current Medications, Allergies, Complete Past Medical History, Past Surgical History, Family History, and Social History were reviewed in [employer] record.            Review of Systems  Constitutional: Negative for fever and unexpected weight change.  HENT: Negative for congestion, dental problem, ear pain, nosebleeds, postnasal drip, rhinorrhea, sinus pressure, sneezing, sore throat  and trouble swallowing.   Eyes: Negative for redness and itching.  Respiratory: Positive for cough and shortness of breath. Negative for chest tightness and wheezing.   Cardiovascular: Negative for palpitations and leg swelling.  Gastrointestinal: Negative for nausea and vomiting.  Genitourinary: Negative for dysuria.  Musculoskeletal: Negative for joint swelling.  Skin: Negative for rash.  Neurological: Negative for headaches.  Hematological: Does not bruise/bleed easily.  Psychiatric/Behavioral: Negative for dysphoric mood. The patient is not nervous/anxious.        Objective:   Physical Exam  amb pleasant wm nad very hoarse with upper airway harsh dry sounding cough/ voice fatigue   Wt Readings from Last 3 Encounters:  06/26/16 182 lb (82.6 kg)  10/10/13 181 lb (82.1 kg)  09/30/13 181 lb (82.1 kg)    Vital signs reviewed  - Note on arrival 02 sats  99% on RA     HEENT: nl dentition, turbinates bilaterally, and oropharynx. Nl external ear canals without cough reflex   NECK :  without JVD/Nodes/TM/ nl carotid upstrokes bilaterally   LUNGS: no acc muscle use,  Nl contour chest which is clear to A and P bilaterally without cough on insp or exp maneuvers   CV:  RRR  no s3 or murmur or increase in P2, and no edema   ABD:  soft and nontender with nl inspiratory excursion in the supine position. No bruits or organomegaly appreciated, bowel  sounds nl  MS:  Nl gait/ ext warm without deformities, calf tenderness, cyanosis or clubbing No obvious joint restrictions   SKIN: warm and dry without lesions    NEURO:  alert, approp, nl sensorium with  no motor or cerebellar deficits apparent.      I personally reviewed images and agree with radiology impression as follows:  CXR:   05/30/16 Bronchitic changes. Probable emphysema. Possible tiny left effusion.     Assessment & Plan:

## 2016-06-27 ENCOUNTER — Telehealth: Payer: Self-pay | Admitting: *Deleted

## 2016-06-27 DIAGNOSIS — J45991 Cough variant asthma: Secondary | ICD-10-CM | POA: Insufficient documentation

## 2016-06-27 NOTE — Telephone Encounter (Signed)
-----   Message from Tanda Rockers, MD sent at 06/27/2016 11:00 AM EDT ----- Pantoprazole (protonix) 40 mg   Take  30-60 min before first meal of the day and Pepcid (famotidine)  20 mg one @  bedtime until return to office - this is the best way to tell whether stomach acid is contributing to your problem.  (called in but forgot to tell him)

## 2016-06-27 NOTE — Assessment & Plan Note (Addendum)
-   Spirometry 06/26/2016  FEV1 2/5 (90%)  Ratio 70 with min curvature p am advair - FENO 06/26/2016  =   18 p am advair  - 06/26/2016  After extensive coaching HFA effectiveness =   75% so try symb 80 2bid and stop advair  Not really clear how much is asthma vs pseudoasthma related to Upper airway cough syndrome (previously labeled PNDS) , is  so named because it's frequently impossible to sort out how much is  CR/sinusitis with freq throat clearing (which can be related to primary GERD)   vs  causing  secondary (" extra esophageal")  GERD from wide swings in gastric pressure that occur with throat clearing, often  promoting self use of mint and menthol lozenges that reduce the lower esophageal sphincter tone and exacerbate the problem further in a cyclical fashion.   These are the same pts (now being labeled as having "irritable larynx syndrome" by some cough centers) who not infrequently have a history of having failed to tolerate ace inhibitors,  dry powder inhalers (like advair which is the most common culprit in my experience)  or biphosphonates or report having atypical/extraesophageal reflux symptoms that don't respond to standard doses of PPI  and are easily confused as having aecopd or asthma flares by even experienced allergists/ pulmonologists (myself included).  rec first try off advair and on low dose symbicort and add gerd diet to PPI rx then regroup in 4 weeks   Total time devoted to counseling  > 50 % of initial 60 min office visit:  review case with pt/ discussion of options/alternatives/ personally creating written customized instructions  in presence of pt  then going over those specific  Instructions directly with the pt including how to use all of the meds but in particular covering each new medication in detail and the difference between the maintenance= "automatic" meds and the prns using an action plan format for the latter (If this problem/symptom => do that organization reading Left  to right).  Please see AVS from this visit for a full list of these instructions which I personally wrote for this pt and  are unique to this visit.

## 2016-06-27 NOTE — Telephone Encounter (Signed)
Spoke with the pt and notified of recs per MW and he verbalized understanding

## 2016-07-08 ENCOUNTER — Institutional Professional Consult (permissible substitution): Payer: Medicare Other | Admitting: Internal Medicine

## 2016-07-30 ENCOUNTER — Encounter: Payer: Self-pay | Admitting: Internal Medicine

## 2016-07-30 ENCOUNTER — Ambulatory Visit (INDEPENDENT_AMBULATORY_CARE_PROVIDER_SITE_OTHER): Payer: Medicare Other | Admitting: Internal Medicine

## 2016-07-30 VITALS — BP 122/72 | HR 67 | Ht 65.0 in | Wt 178.0 lb

## 2016-07-30 DIAGNOSIS — J45991 Cough variant asthma: Secondary | ICD-10-CM | POA: Diagnosis not present

## 2016-07-30 MED ORDER — BUDESONIDE-FORMOTEROL FUMARATE 160-4.5 MCG/ACT IN AERO: 2.0000 | INHALATION_SPRAY | Freq: Two times a day (BID) | RESPIRATORY_TRACT | 0 refills | Status: DC

## 2016-07-30 MED ORDER — BUDESONIDE-FORMOTEROL FUMARATE 160-4.5 MCG/ACT IN AERO: 2.0000 | INHALATION_SPRAY | Freq: Two times a day (BID) | RESPIRATORY_TRACT | 11 refills | Status: DC

## 2016-07-30 NOTE — Progress Notes (Signed)
Subjective:    Patient ID: Andrew Moran, male    DOB: [birth year],   MRN: [id number]     Brief patient profile:  86 yowm never smoker dx as asthma as child growing up in [place] mostly in winter months very limited activity due to sob and summer better s limits improved in his 63s then recurred in his 56's on prn saba but in 2000's required daily treatment with advair but only helped a little and still needed freq saba and saw Dr Bernita Buffy with no resp to shots so self referred to pulmonary clinic 07/30/2016 for refractory cough /sob with nl spirometry at initial ov.     History of Present Illness  06/26/2016 1st [facility] office visit/ Wert   Chief Complaint  Patient presents with  . Advice Only    self referral for emphysema.  worsening sob, nonprod cough Xseveral months.   ? Chronic asthma seen here  By me and By Dr Joya Gaskins around 2006 but does not remember seeing [place] and whether this helped but now comes in with worsening dry cough / throat irritation and doe indolent onset gradually worse x 3 m esp p supper  But does ok sleeping at 45 degrees  s nasal symptoms at all some better p saba  rec Stop advair  Plan A = Automatic = symbicort 80 Take 2 puffs first thing in am and then another 2 puffs about 12 hours later.  Work on inhaler technique: Plan B = Backup Only use your albuterol as a rescue medication GERD diet   Add : Pantoprazole (protonix) 40 mg   Take  30-60 min before first meal of the day and Pepcid (famotidine)  20 mg one @  Bedtime     07/30/2016  f/u ov/Wert re:  Cough variant asthma vs uacs/ much better on gerd rx plus back on advair 250 p ran out of symbicort 80 Chief Complaint  Patient presents with  . Follow-up    Pt states breathing has improved some and he is coughing less. Voice still sounds worse.   walks around neighborhood ok x 20-30 s stopping  Steep at 45 degrees x due to fear of reflux x years but no noct symptoms at all  No need for saba  now, mostly just with "colds"   No obvious day to day or daytime variability or assoc excess/ purulent sputum or mucus plugs or hemoptysis or cp or chest tightness, subjective wheeze or overt sinus or hb symptoms. No unusual exp hx or h/o childhood pna/ asthma or knowledge of premature birth.  Sleeping ok without nocturnal  or early am exacerbation  of respiratory  c/o's or need for noct saba. Also denies any obvious fluctuation of symptoms with weather or environmental changes or other aggravating or alleviating factors except as outlined above   Current Medications, Allergies, Complete Past Medical History, Past Surgical History, Family History, and Social History were reviewed in [employer] [facility] record.  ROS  The following are not active complaints unless bolded sore throat, dysphagia, dental problems, itching, sneezing,  nasal congestion or excess/ purulent secretions, ear ache,   fever, chills, sweats, unintended wt loss, classically pleuritic or exertional cp,  orthopnea pnd or leg swelling, presyncope, palpitations, abdominal pain, anorexia, nausea, vomiting, diarrhea  or change in bowel or bladder habits, change in stools or urine, dysuria,hematuria,  rash, arthralgias, visual complaints, headache, numbness, weakness or ataxia or problems with walking or coordination,  change in mood/affect or  memory.                     Objective:   Physical Exam  amb pleasant wm with freq throat clearing   Wt Readings from Last 3 Encounters:  06/26/16 182 lb (82.6 kg)  10/10/13 181 lb (82.1 kg)  09/30/13 181 lb (82.1 kg)    Vital signs reviewed  - Note on arrival 02 sats  97% on RA     HEENT: nl dentition, turbinates bilaterally, and oropharynx. Nl external ear canals without cough reflex   NECK :  without JVD/Nodes/TM/ nl carotid upstrokes bilaterally   LUNGS: no acc muscle use,  Nl contour chest which is clear to A and P bilaterally without cough on insp or exp  maneuvers   CV:  RRR  no s3 or murmur or increase in P2, and no edema   ABD:  soft and nontender with nl inspiratory excursion in the supine position. No bruits or organomegaly appreciated, bowel sounds nl  MS:  Nl gait/ ext warm without deformities, calf tenderness, cyanosis or clubbing No obvious joint restrictions   SKIN: warm and dry without lesions    NEURO:  alert, approp, nl sensorium with  no motor or cerebellar deficits apparent.      I personally reviewed images and agree with radiology impression as follows:  CXR:   05/30/16 Bronchitic changes. Probable emphysema. Possible tiny left effusion My review:  No significant hyperinflation or effusions     Assessment & Plan:

## 2016-07-30 NOTE — Patient Instructions (Signed)
Work on Doctor, hospital technique:  relax and gently blow all the way out then take a nice smooth deep breath back in, triggering the inhaler at same time you start breathing in.  Hold for up to 5 seconds if you can. Blow out thru nose. Rinse and gargle with water when done  Plan A = Automatic = symbicort 80 Take 2 puffs first thing in am and then another 2 puffs about 12 hours later.                 And continue the reflux medications      Plan B = Backup Only use your albuterol (Proair)  as a rescue medication to be used if you can't catch your breath by resting or doing a relaxed purse lip breathing pattern.  - The less you use it, the better it will work when you need it. - Ok to use the inhaler up to 2 puffs  every 4 hours if you must but call for appointment if use goes up over your usual need - Don't leave home without it !!  (think of it like the spare tire for your car)    Please schedule a follow up visit in 3 months but call sooner if needed

## 2016-07-30 NOTE — Assessment & Plan Note (Addendum)
Spirometry 06/26/2016  FEV1 2.35 (90%)  Ratio 70 with min curvature p am advair - FENO 06/26/2016  =   18 p am advair  - 06/26/2016   so try symb 80 2bid and stop advair   -07/30/2016  After extensive coaching HFA effectiveness =    75%   Marked improvement in doe/ cough on symbicort and no "flare" so far back on advair but note his voice is classic for advair effect so really needs to use the symb 80 2bid hfa instead  Since not clear how much of the UACS component was advair vs Acid reflux, should stay on the same rx x 3 months then return to regroup and ? Taper off gerd rx and maintain strick diet   I had an extended discussion with the patient reviewing all relevant studies completed to date and  lasting 15 to 20 minutes of a 25 minute visit    Each maintenance medication was reviewed in detail including most importantly the difference between maintenance and prns and under what circumstances the prns are to be triggered using an action plan format that is not reflected in the computer generated alphabetically organized AVS.    Please see AVS for specific instructions unique to this visit that I personally wrote and verbalized to the the pt in detail and then reviewed with pt  by my nurse highlighting any  changes in therapy recommended at today's visit to their plan of care.

## 2016-10-03 ENCOUNTER — Other Ambulatory Visit: Payer: Self-pay | Admitting: Internal Medicine

## 2016-10-29 ENCOUNTER — Ambulatory Visit (INDEPENDENT_AMBULATORY_CARE_PROVIDER_SITE_OTHER): Payer: Medicare Other | Admitting: Internal Medicine

## 2016-10-29 ENCOUNTER — Encounter: Payer: Self-pay | Admitting: Internal Medicine

## 2016-10-29 ENCOUNTER — Encounter (INDEPENDENT_AMBULATORY_CARE_PROVIDER_SITE_OTHER): Payer: Self-pay

## 2016-10-29 VITALS — BP 132/74 | HR 85 | Ht 65.0 in | Wt 174.0 lb

## 2016-10-29 DIAGNOSIS — J45991 Cough variant asthma: Secondary | ICD-10-CM

## 2016-10-29 MED ORDER — BUDESONIDE-FORMOTEROL FUMARATE 80-4.5 MCG/ACT IN AERO: 2.0000 | INHALATION_SPRAY | Freq: Two times a day (BID) | RESPIRATORY_TRACT | 0 refills | Status: DC

## 2016-10-29 MED ORDER — BUDESONIDE-FORMOTEROL FUMARATE 80-4.5 MCG/ACT IN AERO: INHALATION_SPRAY | RESPIRATORY_TRACT | 11 refills | Status: DC

## 2016-10-29 NOTE — Assessment & Plan Note (Addendum)
Spirometry 06/26/2016  FEV1 2.35  (90%)  Ratio 70 with min curvature p am advair - FENO 06/26/2016  =   18 p am advair  - 06/26/2016   so try symb 80 2bid and stop advair  - 10/29/2016  After extensive coaching HFA effectiveness =    90% so reduce symb back to 80 2bid and continue gerd x 3 months    All goals of chronic asthma control met including optimal function and elimination of symptoms with minimal need for rescue therapy.  Should be able to taper symb to 80 bid and ? Taper off gerd rx at next ov   Contingencies discussed in full including contacting this office immediately if not controlling the symptoms using the rule of two's.     I had an extended discussion with the patient reviewing all relevant studies completed to date and  lasting 15 to 20 minutes of a 25 minute visit    Each maintenance medication was reviewed in detail including most importantly the difference between maintenance and prns and under what circumstances the prns are to be triggered using an action plan format that is not reflected in the computer generated alphabetically organized AVS.    Please see AVS for specific instructions unique to this visit that I personally wrote and verbalized to the the pt in detail and then reviewed with pt  by my nurse highlighting any  changes in therapy recommended at today's visit to their plan of care.

## 2016-10-29 NOTE — Patient Instructions (Signed)
Work on Doctor, hospital technique:  relax and gently blow all the way out then take a nice smooth deep breath back in, triggering the inhaler at same time you start breathing in.  Hold for up to 5 seconds if you can. Blow out thru nose. Rinse and gargle with water when done  Plan A = Automatic = symbicort 80 Take 2 puffs first thing in am and then another 2 puffs about 12 hours later.                 And continue the reflux medications      Plan B = Backup Only use your albuterol (Proair)  as a rescue medication to be used if you can't catch your breath by resting or doing a relaxed purse lip breathing pattern.  - The less you use it, the better it will work when you need it. - Ok to use the inhaler up to 2 puffs  every 4 hours if you must but call for appointment if use goes up over your usual need - Don't leave home without it !!  (think of it like the spare tire for your car)    Please schedule a follow up visit in 3 months but call sooner if needed

## 2016-10-29 NOTE — Progress Notes (Signed)
Subjective:    Patient ID: Andrew Moran, male    DOB: 31-Oct-1945,   MRN: [id number]     Brief patient profile:  33 yowm never smoker dx as asthma as child growing up in [place] mostly in winter months very limited activity due to sob and summer better s limits improved in his 18s then recurred in his 27's on prn saba but in 2000's required daily treatment with advair but only helped a little and still needed freq saba and saw Dr Bernita Buffy with no resp to shots so self referred to pulmonary clinic 07/30/2016 for refractory cough /sob with nl spirometry at initial ov.     History of Present Illness  06/26/2016 1st [facility] office visit/ Andrew Moran   Chief Complaint  Patient presents with  . Advice Only    self referral for emphysema.  worsening sob, nonprod cough Xseveral months.   ? Chronic asthma seen here  By me and By Dr Joya Gaskins around 2006 but does not remember seeing [place] and whether this helped but now comes in with worsening dry cough / throat irritation and doe indolent onset gradually worse x 3 m esp p supper  But does ok sleeping at 45 degrees  s nasal symptoms at all some better p saba  rec Stop advair  Plan A = Automatic = symbicort 80 Take 2 puffs first thing in am and then another 2 puffs about 12 hours later.  Work on inhaler technique: Plan B = Backup Only use your albuterol as a rescue medication GERD diet   Add : Pantoprazole (protonix) 40 mg   Take  30-60 min before first meal of the day and Pepcid (famotidine)  20 mg one @  Bedtime     07/30/2016  f/u ov/Andrew Moran re:  Cough variant asthma vs uacs/ much better on gerd rx plus back on advair 250 p ran out of symbicort 80 Chief Complaint  Patient presents with  . Follow-up    Pt states breathing has improved some and he is coughing less. Voice still sounds worse.   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                 And continue the reflux medications  Plan B = Backup Only use your albuterol (Proair)  as a rescue medication       10/29/2016  f/u ov/Andrew Moran re: cough variant asthma / still on the 160 symbicort with mild hoarseness but no longer needs saba  Chief Complaint  Patient presents with  . Follow-up    pt doing well, does note occasional nonprod cough.     no noct cough Not limited by breathing from desired activities    No obvious day to day or daytime variability or assoc excess/ purulent sputum or mucus plugs or hemoptysis or cp or chest tightness, subjective wheeze or overt sinus or hb symptoms. No unusual exp hx or h/o childhood pna/ asthma or knowledge of premature birth.  Sleeping ok without nocturnal  or early am exacerbation  of respiratory  c/o's or need for noct saba. Also denies any obvious  fluctuation of symptoms with weather or environmental changes or other aggravating or alleviating factors except as outlined above   Current Medications, Allergies, Complete Past Medical History, Past Surgical History, Family History, and Social History were reviewed in [employer] record.  ROS  The following are not active complaints unless bolded sore throat, dysphagia, dental problems, itching, sneezing,  nasal congestion or excess/ purulent secretions, ear ache,   fever, chills, sweats, unintended wt loss, classically pleuritic or exertional cp,  orthopnea pnd or leg swelling, presyncope, palpitations, abdominal pain, anorexia, nausea, vomiting, diarrhea  or change in bowel or bladder habits, change in stools or urine, dysuria,hematuria,  rash, arthralgias, visual complaints, headache, numbness, weakness or ataxia or problems with walking or coordination,  change in  mood/affect or memory.                  Objective:   Physical Exam  amb pleasant wm  With mild voice fatigue   10/29/2016        174   06/26/16 182 lb (82.6 kg)  10/10/13 181 lb (82.1 kg)  09/30/13 181 lb (82.1 kg)    Vital signs reviewed  - Note on arrival 02 sats  97% on RA     HEENT: nl dentition, turbinates bilaterally, and oropharynx with minimal thrush . Nl external ear canals without cough reflex   NECK :  without JVD/Nodes/TM/ nl carotid upstrokes bilaterally   LUNGS: no acc muscle use,  Nl contour chest which is clear to A and P bilaterally without cough on insp or exp maneuvers   CV:  RRR  no s3 or murmur or increase in P2, and no edema   ABD:  soft and nontender with nl inspiratory excursion in the supine position. No bruits or organomegaly appreciated, bowel sounds nl  MS:  Nl gait/ ext warm without deformities, calf tenderness, cyanosis or clubbing No obvious joint restrictions   SKIN: warm and dry without lesions    NEURO:  alert, approp, nl sensorium with  no motor or cerebellar deficits apparent.          Assessment & Plan:

## 2017-01-28 ENCOUNTER — Encounter: Payer: Self-pay | Admitting: Internal Medicine

## 2017-01-28 ENCOUNTER — Other Ambulatory Visit (INDEPENDENT_AMBULATORY_CARE_PROVIDER_SITE_OTHER): Payer: Medicare Other

## 2017-01-28 ENCOUNTER — Ambulatory Visit: Payer: Medicare Other | Admitting: Internal Medicine

## 2017-01-28 VITALS — BP 132/86 | HR 77 | Ht 65.0 in | Wt 180.6 lb

## 2017-01-28 DIAGNOSIS — J45991 Cough variant asthma: Secondary | ICD-10-CM

## 2017-01-28 LAB — CBC WITH DIFFERENTIAL/PLATELET
BASOS ABS: 0.1 10*3/uL (ref 0.0–0.1)
Basophils Relative: 1 % (ref 0.0–3.0)
EOS PCT: 5.9 % — AB (ref 0.0–5.0)
Eosinophils Absolute: 0.4 10*3/uL (ref 0.0–0.7)
HCT: 41.8 % (ref 39.0–52.0)
Hemoglobin: 13.8 g/dL (ref 13.0–17.0)
LYMPHS ABS: 1.7 10*3/uL (ref 0.7–4.0)
Lymphocytes Relative: 26.3 % (ref 12.0–46.0)
MCHC: 33.1 g/dL (ref 30.0–36.0)
MCV: 90.5 fl (ref 78.0–100.0)
MONO ABS: 0.5 10*3/uL (ref 0.1–1.0)
MONOS PCT: 7 % (ref 3.0–12.0)
NEUTROS ABS: 3.9 10*3/uL (ref 1.4–7.7)
NEUTROS PCT: 59.8 % (ref 43.0–77.0)
PLATELETS: 293 10*3/uL (ref 150.0–400.0)
RBC: 4.61 Mil/uL (ref 4.22–5.81)
RDW: 13.3 % (ref 11.5–15.5)
WBC: 6.6 10*3/uL (ref 4.0–10.5)

## 2017-01-28 LAB — NITRIC OXIDE: Nitric Oxide: 20

## 2017-01-28 MED ORDER — BUDESONIDE-FORMOTEROL FUMARATE 160-4.5 MCG/ACT IN AERO: 2.0000 | INHALATION_SPRAY | Freq: Two times a day (BID) | RESPIRATORY_TRACT | 0 refills | Status: DC

## 2017-01-28 NOTE — Patient Instructions (Signed)
Try symbicort 160 Take 2 puffs first thing in am and then another 2 puffs about 12 hours later.   If the symbicort 160 helps you with your winter cough and reduces your need for albuterol in the afternoons then continue the 160 but call for prescription - if not, then just continue the 80 strength    Work on perfecting  inhaler technique:  relax and gently blow all the way out then take a nice smooth deep breath back in, triggering the inhaler at same time you start breathing in.  Hold for up to 5 seconds if you can. Blow out thru nose. Rinse and gargle with water when done  Please schedule a follow up visit in 3 months but call sooner if needed  with all medications /inhalers/ solutions in hand so we can verify exactly what you are taking. This includes all medications from all doctors and over the counters

## 2017-01-28 NOTE — Assessment & Plan Note (Addendum)
Spirometry 06/26/2016  FEV1 2.35  (90%)  Ratio 70 with min curvature p am advair - FENO 06/26/2016  =   18 p am advair  - 06/26/2016   so try symb 80 2bid and stop advair - 10/29/2016    reduce symb back to 80 2bid and continue gerd x 3 months  - 01/28/2017 flared on symb 80 with lots of upper air way symptoms  - FENO 01/28/2017  =   20 on symb 80 so rec trial of 160 2bid   - 01/28/2017  After extensive coaching HFA effectiveness =    90%   Possible flare of asthma despite maint on symb 80 / gerd rx correlating with onset of cold weather "just like every year since I was a kid" assoc with hoarseness/ mostly dry cough though this could also be pseudoasthma and the high dose of ICS may actually make his symptoms worse    rec allergy profile/ try the higher dose of symb x 2 week sample and if not better return for trial of singulair is not improved but return with all meds in hand using a trust but verify approach to confirm accurate Medication  Reconciliation The principal here is that until we are certain that the  patients are doing what we've asked, it makes no sense to ask them to do more.    I had an extended discussion with the patient reviewing all relevant studies completed to date and  lasting 15 to 20 minutes of a 25 minute visit    Each maintenance medication was reviewed in detail including most importantly the difference between maintenance and prns and under what circumstances the prns are to be triggered using an action plan format that is not reflected in the computer generated alphabetically organized AVS.    Please see AVS for specific instructions unique to this visit that I personally wrote and verbalized to the the pt in detail and then reviewed with pt  by my nurse highlighting any  changes in therapy recommended at today's visit to their plan of care.

## 2017-01-28 NOTE — Progress Notes (Signed)
Subjective:    Patient ID: Andrew Moran, male    DOB: 1946/03/01,   MRN: [id number]     Brief patient profile:  58 yowm never smoker dx as asthma as child growing up in [place] mostly in winter months very limited activity due to sob and summer better s limits improved in his 58s then recurred in his 12's on prn saba but in 2000's required daily treatment with advair but only helped a little and still needed freq saba and saw Dr Bernita Buffy with no resp to shots so self referred to pulmonary clinic 07/30/2016 for refractory cough /sob with nl spirometry at initial ov.     History of Present Illness  06/26/2016 1st [facility] office visit/ Alfredo Collymore   Chief Complaint  Patient presents with  . Advice Only    self referral for emphysema.  worsening sob, nonprod cough Xseveral months.   ? Chronic asthma seen here  By me and By Dr Joya Gaskins around 2006 but does not remember seeing [place] and whether this helped but now comes in with worsening dry cough / throat irritation and doe indolent onset gradually worse x 3 m esp p supper  But does ok sleeping at 45 degrees  s nasal symptoms at all some better p saba  rec Stop advair  Plan A = Automatic = symbicort 80 Take 2 puffs first thing in am and then another 2 puffs about 12 hours later.  Work on inhaler technique: Plan B = Backup Only use your albuterol as a rescue medication GERD diet   Add : Pantoprazole (protonix) 40 mg   Take  30-60 min before first meal of the day and Pepcid (famotidine)  20 mg one @  Bedtime     07/30/2016  f/u ov/Lynnet Hefley re:  Cough variant asthma vs uacs/ much better on gerd rx plus back on advair 250 p ran out of symbicort 80 Chief Complaint  Patient presents with  . Follow-up    Pt states breathing has improved some and he is coughing less. Voice still sounds worse.   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                 And continue the reflux medications  Plan B = Backup Only use your albuterol (Proair)  as a rescue medication       10/29/2016  f/u ov/Orel Cooler re: cough variant asthma / still on the 160 symbicort with mild hoarseness but no longer needs saba  Chief Complaint  Patient presents with  . Follow-up    pt doing well, does note occasional nonprod cough.     no noct cough Not limited by breathing from desired activities   rec Work on perfecting inhaler technique:  Plan A = Automatic = symbicort 80 Take 2 puffs first thing in am and then another 2 puffs about 12 hours later.                 And continue the reflux medications  Plan B = Backup Only use your albuterol (Proair)  as a rescue medication  01/28/2017  f/u ov/China Deitrick re:  Cough varant asthma  Chief Complaint  Patient presents with  . Follow-up    Increased cough since the weather turned cold "tickle in throat". Cough is non prod. He uses his albuterol inhaler 2-3 x per wk on average.   was doing great on symb 80 2bid and gerd rx and no problems with exertion or sleeping or need for saba Then with onset cold weather gradually worse cough/ sob and started needing rescue by 8 h p symbicort each afternoon  Does fine overnight   No obvious day to day or daytime variability or assoc excess/ purulent sputum or mucus plugs or hemoptysis or cp or chest tightness, subjective wheeze or overt sinus or hb symptoms. No unusual exp hx or h/o childhood pna/ asthma or knowledge of premature birth.  Sleeping ok flat without nocturnal  or early am exacerbation  of respiratory  c/o's or need for noct saba. Also denies any obvious fluctuation of symptoms with weather or environmental changes or other aggravating or alleviating factors except as outlined  above   Current Allergies, Complete Past Medical History, Past Surgical History, Family History, and Social History were reviewed in [employer] record.  ROS  The following are not active complaints unless bolded Hoarseness, sore throat, dysphagia, dental problems, itching, sneezing,  nasal congestion or discharge of excess mucus or purulent secretions, ear ache,   fever, chills, sweats, unintended wt loss or wt gain, classically pleuritic or exertional cp,  orthopnea pnd or leg swelling, presyncope, palpitations, abdominal pain, anorexia, nausea, vomiting, diarrhea  or change in bowel habits or change in bladder habits, change in stools or change in urine, dysuria, hematuria,  rash, arthralgias, visual complaints, headache, numbness, weakness or ataxia or problems with walking or coordination,  change in mood/affect or memory.        Current Meds  Medication Sig  . albuterol (PROVENTIL HFA;VENTOLIN HFA) 108 (90 BASE) MCG/ACT inhaler Inhale 2 puffs into the lungs every 4 (four) hours as needed for wheezing or shortness of breath. Wheezing   . atorvastatin (LIPITOR) 20 MG tablet Take 20 mg by mouth at bedtime.   . budesonide-formoterol (SYMBICORT) 80-4.5 MCG/ACT inhaler Take 2 puffs first thing in am and then another 2 puffs about 12 hours later.  . famotidine (PEPCID) 20 MG tablet One at bedtime  . Multiple Vitamins-Minerals (MULTIVITAMINS THER. W/MINERALS) TABS Take 1 tablet by mouth daily.   . naproxen sodium (ANAPROX) 220 MG tablet Take 440 mg by mouth 2 (two) times daily as needed (pain).  Marland Kitchen OVER THE COUNTER MEDICATION Take 2-3 tablets by mouth daily. Papaya complex enzyme  . OVER THE COUNTER MEDICATION Take 2 tablets by mouth at bedtime. Deglycyrrhizinated-DGL  . pantoprazole (PROTONIX) 40 MG tablet TAKE ONE TABLET BY MOUTH DAILY 30-60 MINUTES BEFORE FIRST MEAL OF THE DAY  . Probiotic Product (PROBIOTIC FORMULA PO) Take 1 capsule by mouth daily.   . [DISCONTINUED]  APPLE CIDER VINEGAR PO Take 45 mLs by mouth daily.                    Objective:   Physical Exam  amb pleasant wm  With classic voice fatigue    01/28/2017      180  10/29/2016        174   06/26/16 182 lb (82.6 kg)  10/10/13 181 lb (82.1 kg)  09/30/13 181 lb (82.1 kg)    Vital signs reviewed  - Note on arrival  02 sats  96% on RA      HEENT: nl dentition, turbinates bilaterally, and oropharynx. Nl external ear canals without cough reflex   NECK :  without JVD/Nodes/TM/ nl carotid upstrokes bilaterally   LUNGS: no acc muscle use,  Nl contour chest which is clear to A and P bilaterally without cough on insp or exp maneuvers   CV:  RRR  no s3 or murmur or increase in P2, and no edema   ABD:  soft and nontender with nl inspiratory excursion in the supine position. No bruits or organomegaly appreciated, bowel sounds nl  MS:  Nl gait/ ext warm without deformities, calf tenderness, cyanosis or clubbing No obvious joint restrictions   SKIN: warm and dry without lesions    NEURO:  alert, approp, nl sensorium with  no motor or cerebellar deficits apparent.        Labs ordered 01/28/2017   Allergy profile             Assessment & Plan:

## 2017-01-29 LAB — RESPIRATORY ALLERGY PROFILE REGION II ~~LOC~~
ALLERGEN, COMM SILVER BIRCH, T3: 2.73 kU/L — AB
Allergen, D pternoyssinus,d7: 4.34 kU/L — ABNORMAL HIGH
Allergen, Mouse Urine Protein, e78: <0.1 kU/L
Allergen, Oak,t7: 1.86 kU/L — ABNORMAL HIGH
Allergen, P. notatum, m1: <0.1 kU/L
Aspergillus fumigatus, m3: <0.1 kU/L
Box Elder IgE: <0.1 kU/L
CAT DANDER: 0.27 kU/L — AB
CLASS: 0
CLASS: 0
CLASS: 0
CLASS: 0
CLASS: 0
CLASS: 0
CLASS: 0
CLASS: 0
CLASS: 0
CLASS: 0
CLASS: 0
CLASS: 0
CLASS: 0
CLASS: 0
CLASS: 2
CLASS: 2
COMMON RAGWEED (SHORT) (W1) IGE: 0.23 kU/L — AB
Class: 0
Class: 0
Class: 0
Class: 0
Class: 0
Class: 2
Class: 3
Class: 3
Cockroach: <0.1 kU/L
D. farinae: 4.74 kU/L — ABNORMAL HIGH
Elm IgE: 0.23 kU/L — ABNORMAL HIGH
IgE (Immunoglobulin E), Serum: 49 kU/L (ref <=114)
Johnson Grass: <0.1 kU/L
PECAN/HICKORY TREE IGE: 2.4 kU/L — AB
Rough Pigweed  IgE: <0.1 kU/L
Timothy Grass: <0.1 kU/L

## 2017-01-29 LAB — INTERPRETATION:

## 2017-01-29 NOTE — Progress Notes (Signed)
Spoke with pt and notified of results per Dr. Wert. Pt verbalized understanding and denied any questions. 

## 2017-04-30 ENCOUNTER — Other Ambulatory Visit: Payer: Self-pay | Admitting: Internal Medicine

## 2017-05-11 ENCOUNTER — Ambulatory Visit: Payer: Medicare Other | Admitting: Internal Medicine

## 2017-05-11 ENCOUNTER — Encounter: Payer: Self-pay | Admitting: Internal Medicine

## 2017-05-11 VITALS — BP 112/80 | HR 64 | Ht 65.0 in | Wt 184.0 lb

## 2017-05-11 DIAGNOSIS — J45991 Cough variant asthma: Secondary | ICD-10-CM

## 2017-05-11 NOTE — Patient Instructions (Signed)
GERD (REFLUX)  is an extremely common cause of respiratory symptoms just like yours , many times with no obvious heartburn at all.    It can be treated with medication, but also with lifestyle changes including elevation of the head of your bed (ideally with 6 inch  bed blocks),  Smoking cessation, avoidance of late meals, excessive alcohol, and avoid fatty foods, chocolate, peppermint, colas, red wine, and acidic juices such as orange juice.  NO MINT OR MENTHOL PRODUCTS SO NO COUGH DROPS   USE SUGARLESS CANDY INSTEAD (Jolley ranchers or Stover's or Life Savers) or even ice chips will also do - the key is to swallow to prevent all throat clearing. NO OIL BASED VITAMINS - use powdered substitutes.    Please schedule a follow up visit in 6  months but call sooner if needed

## 2017-05-11 NOTE — Assessment & Plan Note (Signed)
Spirometry 06/26/2016  FEV1 2.35  (90%)  Ratio 70 with min curvature p am advair - FENO 06/26/2016  =   18 p am advair  - 06/26/2016   so try symb 80 2bid and stop advair - 10/29/2016    reduce symb back to 80 2bid and continue gerd x 3 months - 01/28/2017 flared on symb 80 with lots of upper air way symptoms  - FENO 01/28/2017  =   20 on symb 80 so rec trial of 160 2bid   - 01/28/2017  After extensive coaching HFA effectiveness =    90%  - Allergy profile 01/28/2017 >  Eos 0.4/  IgE  49  RAST pos dust . Cat/tree/ ragweed   All goals of chronic asthma control met including optimal function and elimination of symptoms with minimal need for rescue therapy.  Contingencies discussed in full including contacting this office immediately if not controlling the symptoms using the rule of two's.     Main issue is controlling the urge to clear the throat > rec gerd diet/ hard rock candy and no change symbicort rx fr for now  F/u  In 6 m    I had an extended discussion with the patient reviewing all relevant studies completed to date and  lasting 15 to 20 minutes of a 25 minute visit    Each maintenance medication was reviewed in detail including most importantly the difference between maintenance and prns and under what circumstances the prns are to be triggered using an action plan format that is not reflected in the computer generated alphabetically organized AVS.    Please see AVS for specific instructions unique to this visit that I personally wrote and verbalized to the the pt in detail and then reviewed with pt  by my nurse highlighting any  changes in therapy recommended at today's visit to their plan of care.

## 2017-05-11 NOTE — Progress Notes (Signed)
Subjective:    Patient ID: Andrew Moran, male    DOB: 1946/03/01,   MRN: [id number]     Brief patient profile:  58 yowm never smoker dx as asthma as child growing up in [place] mostly in winter months very limited activity due to sob and summer better s limits improved in his 58s then recurred in his 12's on prn saba but in 2000's required daily treatment with advair but only helped a little and still needed freq saba and saw Dr Bernita Buffy with no resp to shots so self referred to pulmonary clinic 07/30/2016 for refractory cough /sob with nl spirometry at initial ov.     History of Present Illness  06/26/2016 1st [facility] office visit/ Camdyn Beske   Chief Complaint  Patient presents with  . Advice Only    self referral for emphysema.  worsening sob, nonprod cough Xseveral months.   ? Chronic asthma seen here  By me and By Dr Joya Gaskins around 2006 but does not remember seeing [place] and whether this helped but now comes in with worsening dry cough / throat irritation and doe indolent onset gradually worse x 3 m esp p supper  But does ok sleeping at 45 degrees  s nasal symptoms at all some better p saba  rec Stop advair  Plan A = Automatic = symbicort 80 Take 2 puffs first thing in am and then another 2 puffs about 12 hours later.  Work on inhaler technique: Plan B = Backup Only use your albuterol as a rescue medication GERD diet   Add : Pantoprazole (protonix) 40 mg   Take  30-60 min before first meal of the day and Pepcid (famotidine)  20 mg one @  Bedtime     07/30/2016  f/u ov/Edom Schmuhl re:  Cough variant asthma vs uacs/ much better on gerd rx plus back on advair 250 p ran out of symbicort 80 Chief Complaint  Patient presents with  . Follow-up    Pt states breathing has improved some and he is coughing less. Voice still sounds worse.   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                 And continue the reflux medications  Plan B = Backup Only use your albuterol (Proair)  as a rescue medication       10/29/2016  f/u ov/Nianna Igo re: cough variant asthma / still on the 160 symbicort with mild hoarseness but no longer needs saba  Chief Complaint  Patient presents with  . Follow-up    pt doing well, does note occasional nonprod cough.     no noct cough Not limited by breathing from desired activities   rec Work on perfecting inhaler technique:  Plan A = Automatic = symbicort 80 Take 2 puffs first thing in am and then another 2 puffs about 12 hours later.                 And continue the reflux medications  Plan B = Backup Only use your albuterol (Proair)  as a rescue medication  01/28/2017  f/u ov/Chayah Mckee re:  Cough varant asthma  Chief Complaint  Patient presents with  . Follow-up    Increased cough since the weather turned cold "tickle in throat". Cough is non prod. He uses his albuterol inhaler 2-3 x per wk on average.   was doing great on symb 80 2bid and gerd rx and no problems with exertion or sleeping or need for saba Then with onset cold weather gradually worse cough/ sob and started needing rescue by 8 h p symbicort each afternoon  Does fine overnight  rec Try symbicort 160 Take 2 puffs first thing in am and then another 2 puffs about 12 hours later.  If the symbicort 160 helps you with your winter cough and reduces your need for albuterol in the afternoons then continue the 160 but call for prescription - if not, then just continue the 80 strength    05/11/2017  f/u ov/Loran Fleet re:  Cough variant asthma / uacs  Chief Complaint  Patient presents with  . Follow-up    dxed with sinus infection approx 3 wks ago- txed with augmentin. He states feeling better, but  has a tickle in his throat. He rarely uses his albuterol.    Dyspnea:  Not limited by breathing from desired activities   Cough: some throat tickle esp p uri /sinus flare   Sleep: does fine most nocts s cough/ wheeze  SABA use:  Rarely needs   No obvious day to day or daytime variability or assoc excess/ purulent sputum or mucus plugs or hemoptysis or cp or chest tightness, subjective wheeze or overt sinus or hb symptoms. No unusual exposure hx or h/o childhood pna/ asthma or knowledge of premature birth.  Sleeping ok flat without nocturnal  or early am exacerbation  of respiratory  c/o's or need for noct saba. Also denies any obvious fluctuation of symptoms with weather or environmental changes or other aggravating or alleviating factors except as outlined above   Current Allergies, Complete Past Medical History, Past Surgical History, Family History, and Social History were reviewed in [employer] record.  ROS  The following are not active complaints unless bolded Hoarseness, sore throat, dysphagia, dental problems, itching, sneezing,  nasal congestion or discharge of excess mucus or purulent secretions, ear ache,   fever, chills, sweats, unintended wt loss or wt gain, classically pleuritic or exertional cp,  orthopnea pnd or leg swelling, presyncope, palpitations, abdominal pain, anorexia, nausea, vomiting, diarrhea  or change in bowel habits or change in bladder habits, change in stools or change in urine, dysuria, hematuria,  rash, arthralgias, visual complaints, headache, numbness, weakness or ataxia or problems with walking or coordination,  change in mood/affect or memory.        Current Meds  Medication Sig  . albuterol (PROVENTIL HFA;VENTOLIN HFA) 108 (90 BASE) MCG/ACT inhaler Inhale 2 puffs into the lungs every 4 (four) hours as needed for wheezing or shortness of breath. Wheezing   . atorvastatin (LIPITOR) 20 MG tablet Take 20 mg by mouth at bedtime.   .  budesonide-formoterol (SYMBICORT) 160-4.5 MCG/ACT inhaler Inhale 2 puffs 2 (two) times daily into the lungs.  . famotidine (PEPCID) 20 MG tablet One at bedtime  . Multiple Vitamins-Minerals (MULTIVITAMINS THER. W/MINERALS) TABS Take 1 tablet by mouth daily.   . naproxen sodium (ANAPROX) 220 MG tablet Take 440 mg by mouth 2 (two) times daily as needed (pain).  Marland Kitchen OVER THE COUNTER MEDICATION Take 2-3 tablets by mouth daily. Papaya complex enzyme  .  OVER THE COUNTER MEDICATION Take 2 tablets by mouth at bedtime. Deglycyrrhizinated-DGL  . pantoprazole (PROTONIX) 40 MG tablet TAKE ONE TABLET BY MOUTH DAILY 30-60 MINUTES BEFORE FIRST MEAL OF THE DAY  . Probiotic Product (PROBIOTIC FORMULA PO) Take 1 capsule by mouth daily.            Objective:   Physical Exam  amb wm with occ throat clearing   05/11/2017        184  01/28/2017      180  10/29/2016        174   06/26/16 182 lb (82.6 kg)  10/10/13 181 lb (82.1 kg)  09/30/13 181 lb (82.1 kg)     Vital signs reviewed - Note on arrival 02 sats  99% on RA      HEENT: nl dentition, turbinates bilaterally, and oropharynx which is pristine . Nl external ear canals without cough reflex   NECK :  without JVD/Nodes/TM/ nl carotid upstrokes bilaterally   LUNGS: no acc muscle use,  Nl contour chest which is clear to A and P bilaterally without cough on insp or exp maneuvers   CV:  RRR  no s3 or murmur or increase in P2, and no edema   ABD:  soft and nontender with nl inspiratory excursion in the supine position. No bruits or organomegaly appreciated, bowel sounds nl  MS:  Nl gait/ ext warm without deformities, calf tenderness, cyanosis or clubbing No obvious joint restrictions   SKIN: warm and dry without lesions    NEURO:  alert, approp, nl sensorium with  no motor or cerebellar deficits apparent.          Assessment & Plan:

## 2017-09-02 ENCOUNTER — Other Ambulatory Visit: Payer: Self-pay | Admitting: Internal Medicine

## 2017-11-09 ENCOUNTER — Encounter: Payer: Self-pay | Admitting: Internal Medicine

## 2017-11-09 ENCOUNTER — Ambulatory Visit: Payer: Medicare Other | Admitting: Internal Medicine

## 2017-11-09 VITALS — BP 120/80 | HR 63 | Ht 65.0 in | Wt 183.0 lb

## 2017-11-09 DIAGNOSIS — J45991 Cough variant asthma: Secondary | ICD-10-CM | POA: Diagnosis not present

## 2017-11-09 NOTE — Progress Notes (Signed)
Subjective:    Patient ID: Andrew Moran, male    DOB: 1946/03/01,   MRN: [id number]     Brief patient profile:  58 yowm never smoker dx as asthma as child growing up in [place] mostly in winter months very limited activity due to sob and summer better s limits improved in his 58s then recurred in his 12's on prn saba but in 2000's required daily treatment with advair but only helped a little and still needed freq saba and saw Dr Bernita Buffy with no resp to shots so self referred to pulmonary clinic 07/30/2016 for refractory cough /sob with nl spirometry at initial ov.     History of Present Illness  06/26/2016 1st [facility] office visit/ Wert   Chief Complaint  Patient presents with  . Advice Only    self referral for emphysema.  worsening sob, nonprod cough Xseveral months.   ? Chronic asthma seen here  By me and By Dr Joya Gaskins around 2006 but does not remember seeing [place] and whether this helped but now comes in with worsening dry cough / throat irritation and doe indolent onset gradually worse x 3 m esp p supper  But does ok sleeping at 45 degrees  s nasal symptoms at all some better p saba  rec Stop advair  Plan A = Automatic = symbicort 80 Take 2 puffs first thing in am and then another 2 puffs about 12 hours later.  Work on inhaler technique: Plan B = Backup Only use your albuterol as a rescue medication GERD diet   Add : Pantoprazole (protonix) 40 mg   Take  30-60 min before first meal of the day and Pepcid (famotidine)  20 mg one @  Bedtime     07/30/2016  f/u ov/Wert re:  Cough variant asthma vs uacs/ much better on gerd rx plus back on advair 250 p ran out of symbicort 80 Chief Complaint  Patient presents with  . Follow-up    Pt states breathing has improved some and he is coughing less. Voice still sounds worse.   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                 And continue the reflux medications  Plan B = Backup Only use your albuterol (Proair)  as a rescue medication       10/29/2016  f/u ov/Wert re: cough variant asthma / still on the 160 symbicort with mild hoarseness but no longer needs saba  Chief Complaint  Patient presents with  . Follow-up    pt doing well, does note occasional nonprod cough.     no noct cough Not limited by breathing from desired activities   rec Work on perfecting inhaler technique:  Plan A = Automatic = symbicort 80 Take 2 puffs first thing in am and then another 2 puffs about 12 hours later.                 And continue the reflux medications  Plan B = Backup Only use your albuterol (Proair)  as a rescue medication  01/28/2017  f/u ov/Wert re:  Cough varant asthma  Chief Complaint  Patient presents with  . Follow-up    Increased cough since the weather turned cold "tickle in throat". Cough is non prod. He uses his albuterol inhaler 2-3 x per wk on average.   was doing great on symb 80 2bid and gerd rx and no problems with exertion or sleeping or need for saba Then with onset cold weather gradually worse cough/ sob and started needing rescue by 8 h p symbicort each afternoon  Does fine overnight  rec Try symbicort 160 Take 2 puffs first thing in am and then another 2 puffs about 12 hours later.  If the symbicort 160 helps you with your winter cough and reduces your need for albuterol in the afternoons then continue the 160 but call for prescription - if not, then just continue the 80 strength       11/09/2017  f/u ov/Wert re:  Cough variant with ? Component uacs/ doing better  on symb 160 than 80 Chief Complaint  Patient presents with  . Follow-up    Pt states he has been doing good since last visit.  Pt states he did have some mild complaints of SOB x1week ago and had so use his rescue inhaler a couple of times and wonders if he ate something that didn't agree with him.  Dyspnea:  Not limited by breathing from desired activities   Cough: minimal Sleeping: ok / slt elevation hob SABA use: rarely  02: no   Not using ppi prn now s many flares   No obvious day to day or daytime variability or assoc excess/ purulent sputum or mucus plugs or hemoptysis or cp or chest tightness, subjective wheeze or overt sinus or hb symptoms.   Sleeping as above  without nocturnal  or early am exacerbation  of respiratory  c/o's or need for noct saba. Also denies any obvious fluctuation of symptoms with weather or environmental changes or other aggravating or alleviating factors except as outlined above   No unusual exposure hx or h/o childhood pna/ asthma or knowledge of premature birth.  Current Allergies, Complete Past Medical History, Past Surgical History, Family History, and Social History were reviewed in [employer] [facility] record.  ROS  The following are not active complaints unless bolded Hoarseness, sore throat, dysphagia, dental problems, itching, sneezing,  nasal congestion or discharge of excess mucus or purulent secretions, ear ache,   fever, chills, sweats, unintended wt loss or wt gain, classically pleuritic or exertional cp,  orthopnea pnd or arm/hand swelling  or leg swelling, presyncope, palpitations, abdominal pain, anorexia, nausea, vomiting, diarrhea  or change in bowel habits or change in bladder habits, change in stools or change in urine, dysuria, hematuria,  rash, arthralgias, visual complaints, headache, numbness, weakness or ataxia or problems with walking or coordination,  change in mood or  memory.        Current Meds  Medication Sig  . albuterol (PROVENTIL HFA;VENTOLIN HFA) 108 (90 BASE) MCG/ACT inhaler Inhale 2 puffs into the lungs every 4 (four) hours as needed  for wheezing or shortness of breath. Wheezing   . atorvastatin (LIPITOR) 20 MG tablet Take 20 mg by mouth at bedtime.   . famotidine (PEPCID) 20 MG tablet One at bedtime  . Multiple Vitamins-Minerals (MULTIVITAMINS THER. W/MINERALS) TABS Take 1 tablet by mouth daily.   . naproxen sodium (ANAPROX) 220 MG tablet Take 440 mg by mouth 2 (two) times daily as needed (  pain).  Marland Kitchen OVER THE COUNTER MEDICATION Take 2-3 tablets by mouth daily. Papaya complex enzyme  . OVER THE COUNTER MEDICATION Take 2 tablets by mouth at bedtime. Deglycyrrhizinated-DGL  . Probiotic Product (PROBIOTIC FORMULA PO) Take 1 capsule by mouth daily.   . SYMBICORT 160-4.5 MCG/ACT inhaler INHALE TWO PUFFS BY MOUTH TWICE A DAY                 Objective:   Physical Exam  amb wm / all smiles / slt voice fatigue/ throat cleairng   11/09/2017        183  05/11/2017        184  01/28/2017      180  10/29/2016        174   06/26/16 182 lb (82.6 kg)  10/10/13 181 lb (82.1 kg)  09/30/13 181 lb (82.1 kg)     Vital signs reviewed - Note on arrival 02 sats   96 % on RA     HEENT: nl dentition, turbinates bilaterally, and oropharynx. Nl external ear canals without cough reflex   NECK :  without JVD/Nodes/TM/ nl carotid upstrokes bilaterally   LUNGS: no acc muscle use,  Nl contour chest which is clear to A and P bilaterally without cough on insp or exp maneuvers   CV:  RRR  no s3 or murmur or increase in P2, and no edema   ABD:  soft and nontender with nl inspiratory excursion in the supine position. No bruits or organomegaly appreciated, bowel sounds nl  MS:  Nl gait/ ext warm without deformities, calf tenderness, cyanosis or clubbing No obvious joint restrictions   SKIN: warm and dry without lesions    NEURO:  alert, approp, nl sensorium with  no motor or cerebellar deficits apparent.            Assessment & Plan:

## 2017-11-09 NOTE — Patient Instructions (Addendum)
For itching/ sneezing/ runny nose > zyrtec or clariton/allegra only as needed   At the onset of any cough/ throat clearing immediately start back on protonix 40 mg Take 30-60 min before first meal of the day    Please schedule a follow up visit in 6  months but call sooner if needed

## 2017-11-10 ENCOUNTER — Encounter: Payer: Self-pay | Admitting: Internal Medicine

## 2017-11-10 NOTE — Assessment & Plan Note (Addendum)
Spirometry 06/26/2016  FEV1 2.35  (90%)  Ratio 70 with min curvature p am advair - FENO 06/26/2016  =   18 p am advair  - 06/26/2016   so try symb 80 2bid and stop advair - 10/29/2016    reduce symb back to 80 2bid and continue gerd x 3 months - 01/28/2017 flared on symb 80 with lots of upper air way symptoms  - FENO 01/28/2017  =   20 on symb 80 so rec trial of 160 2bid   - 01/28/2017  After extensive coaching HFA effectiveness =    90%  - Allergy profile 01/28/2017 >  Eos 0.4/  IgE  49  RAST pos dust Cat/tree/ ragweed   All goals of chronic asthma control met including optimal function and elimination of symptoms with minimal need for rescue therapy.  Contingencies discussed in full including contacting this office immediately if not controlling the symptoms using the rule of two's.     He does still have some upper airway coughing that potentially could be aggravated by either occult gerd or high dose symbicort o pnds related to allergic rhinitis with above allergens reviewed/  Advised to restart gerd rx and add otc antihistamines prn/ contingencies reviewed in detail using teach back method    Each maintenance medication was reviewed in detail including most importantly the difference between maintenance and as needed and under what circumstances the prns are to be used.  Please see AVS for specific  Instructions which are unique to this visit and I personally typed out  which were reviewed in detail in writing with the patient and a copy provided.    > 50 % of this 25 min ov used for counseling/ med review

## 2018-02-16 HISTORY — PX: ROTATOR CUFF REPAIR: SHX139

## 2018-05-10 ENCOUNTER — Ambulatory Visit (INDEPENDENT_AMBULATORY_CARE_PROVIDER_SITE_OTHER): Payer: Medicare Other | Admitting: Internal Medicine

## 2018-05-10 ENCOUNTER — Encounter: Payer: Self-pay | Admitting: Internal Medicine

## 2018-05-10 VITALS — BP 126/74 | HR 81 | Ht 65.0 in | Wt 178.4 lb

## 2018-05-10 DIAGNOSIS — J45991 Cough variant asthma: Secondary | ICD-10-CM

## 2018-05-10 LAB — NITRIC OXIDE: Nitric Oxide: 22

## 2018-05-10 MED ORDER — BUDESONIDE-FORMOTEROL FUMARATE 160-4.5 MCG/ACT IN AERO: INHALATION_SPRAY | RESPIRATORY_TRACT | 0 refills | Status: DC

## 2018-05-10 NOTE — Patient Instructions (Addendum)
Plan A = Automatic = symbiocrt 160 up to 2 pffs every 12 hours    Plan B = Backup Only use your albuterol inhaler as a rescue medication to be used if you can't catch your breath by resting or doing a relaxed purse lip breathing pattern.  - The less you use it, the better it will work when you need it. - Ok to use the inhaler up to 2 puffs  every 4 hours if you must but call for appointment if use goes up over your usual need - Don't leave home without it !!  (think of it like the spare tire for your car)    Please schedule a follow up visit in 6 months but call sooner if needed

## 2018-05-10 NOTE — Progress Notes (Signed)
Subjective:   Patient ID: Andrew Moran, male    DOB: 06/14/1945,   MRN: [id number]     Brief patient profile:  72yowm never smoker dx as asthma as child growing up in [place] mostly in winter months very limited activity due to sob and summer better s limits improved in his 91s then recurred in his 38's on prn saba but in 2000's required daily treatment with advair but only helped a little and still needed freq saba and saw Dr Bernita Buffy with no resp to shots so self referred to pulmonary clinic 07/30/2016 for refractory cough /sob with nl spirometry at initial ov.     History of Present Illness  06/26/2016 1st [facility] office visit/ Andrew Moran   Chief Complaint  Patient presents with  . Advice Only    self referral for emphysema.  worsening sob, nonprod cough Xseveral months.   ? Chronic asthma seen here  By me and By Dr Joya Gaskins around 2006 but does not remember seeing [place] and whether this helped but now comes in with worsening dry cough / throat irritation and doe indolent onset gradually worse x 3 m esp p supper  But does ok sleeping at 45 degrees  s nasal symptoms at all some better p saba  rec Stop advair  Plan A = Automatic = symbicort 80 Take 2 puffs first thing in am and then another 2 puffs about 12 hours later.  Work on inhaler technique: Plan B = Backup Only use your albuterol as a rescue medication GERD diet   Add : Pantoprazole (protonix) 40 mg   Take  30-60 min before first meal of the day and Pepcid (famotidine)  20 mg one @  Bedtime     07/30/2016  f/u ov/Andrew Moran re:  Cough variant asthma vs uacs/ much better on gerd rx plus back on advair 250 p ran out of symbicort 80 Chief Complaint  Patient presents with  . Follow-up    Pt states breathing has improved some and he is coughing less. Voice still sounds worse.   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                 And continue the reflux medications  Plan B = Backup Only use your albuterol (Proair)  as a rescue medication        01/28/2017  f/u ov/Andrew Moran re:  Cough varant asthma on symb 80 2bid  Chief Complaint  Patient presents with  . Follow-up    Increased cough since the weather turned cold "tickle in throat". Cough is non prod. He uses his albuterol inhaler 2-3 x per wk on average.   was doing great on symb 80 2bid and gerd rx and no problems with exertion or sleeping or need for saba Then with onset cold weather gradually worse cough/ sob and started needing rescue by 8 h p symbicort each afternoon  Does fine overnight  rec Try symbicort 160 Take 2 puffs first thing in am and then another 2 puffs about 12 hours later.  If the symbicort 160 helps you with your winter cough  and reduces your need for albuterol in the afternoons then continue the 160 but call for prescription - if not, then just continue the 80 strength       11/09/2017  f/u ov/Carlas Moran re:  Cough variant with ? Component uacs/ doing better  on symb 160 than 80 Chief Complaint  Patient presents with  . Follow-up    Pt states he has been doing good since last visit. Pt states he did have some mild complaints of SOB x1week ago and had so use his rescue inhaler a couple of times and wonders if he ate something that didn't agree with him.  Dyspnea:  Not limited by breathing from desired activities   Cough: minimal Sleeping: ok / slt elevation hob SABA use: rarely  Not using ppi prn now s many flares  rec For itching/ sneezing/ runny nose > zyrtec or clariton/allegra only as needed  At the onset of any cough/ throat clearing immediately start back on protonix 40 mg Take 30-60 min before first meal of the day     05/10/2018  f/u  ov/Andrew Moran re: cough variant asthma on symb 160 x 2 hs /  1 or 2 in am "prn" Chief Complaint  Patient presents with  . Follow-up    Breathing is overall doing well. He has not had to use his albuterol inhaler.    Dyspnea:  Not limited by breathing from desired activities   Cough: no, just sometimes with talking  Sleeping: in a recliner due to shoulder  SABA use: not using saba  02: none    No obvious day to day or daytime variability or assoc excess/ purulent sputum or mucus plugs or hemoptysis or cp or chest tightness, subjective wheeze or overt sinus or hb symptoms.   Sleeping as above without nocturnal  or early am exacerbation  of respiratory  c/o's or need for noct saba. Also denies any obvious fluctuation of symptoms with weather or environmental changes or other aggravating or alleviating factors except as outlined above   No unusual exposure hx or h/o childhood pna or knowledge of premature birth.  Current Allergies, Complete Past Medical History, Past Surgical History, Family History, and Social History were reviewed in [employer] record.  ROS  The following are not active complaints unless bolded Hoarseness, sore throat, dysphagia, dental problems, itching, sneezing,  nasal congestion or discharge of excess mucus or purulent secretions, ear ache,   fever, chills, sweats, unintended wt loss or wt gain, classically pleuritic or exertional cp,  orthopnea pnd or arm/hand swelling  or leg swelling, presyncope, palpitations, abdominal pain, anorexia, nausea, vomiting, diarrhea  or change in bowel habits or change in bladder habits, change in stools or change in urine, dysuria, hematuria,  rash, arthralgias, visual complaints, headache, numbness, weakness or ataxia or problems with walking or coordination,  change in mood or  memory.        Current Meds  Medication Sig  . albuterol (PROVENTIL HFA;VENTOLIN HFA) 108 (90 BASE) MCG/ACT inhaler Inhale 2 puffs into the  lungs every 4 (four) hours as needed for wheezing or shortness of breath. Wheezing   . atorvastatin (LIPITOR) 20 MG tablet Take 20 mg by mouth at bedtime.   . Multiple Vitamins-Minerals (MULTIVITAMINS THER. W/MINERALS) TABS Take 1 tablet by mouth daily.   . naproxen sodium (ANAPROX) 220 MG tablet Take 440 mg by mouth 2 (two) times daily as needed (pain).  Marland Kitchen OVER THE COUNTER MEDICATION Take 2-3 tablets by mouth daily.  Papaya complex enzyme  . OVER THE COUNTER MEDICATION Take 2 tablets by mouth at bedtime. Deglycyrrhizinated-DGL  . Probiotic Product (PROBIOTIC FORMULA PO) Take 1 capsule by mouth daily.   . SYMBICORT 160-4.5 MCG/ACT inhaler INHALE TWO PUFFS BY MOUTH TWICE A DAY                     Objective:   Physical Exam  amb wm nad   05/10/2018        178  11/09/2017        183  05/11/2017        184  01/28/2017      180  10/29/2016        174   06/26/16 182 lb (82.6 kg)  10/10/13 181 lb (82.1 kg)  09/30/13 181 lb (82.1 kg)     Vital signs reviewed - Note on arrival 02 sats  98% on RA     HEENT: nl dentition, turbinates bilaterally, and oropharynx. Nl external ear canals without cough reflex   NECK :  without JVD/Nodes/TM/ nl carotid upstrokes bilaterally   LUNGS: no acc muscle use,  Nl contour chest which is clear to A and P bilaterally without cough on insp or exp maneuvers   CV:  RRR  no s3 or murmur or increase in P2, and no edema   ABD:  soft and nontender with nl inspiratory excursion in the supine position. No bruits or organomegaly appreciated, bowel sounds nl  MS:  Nl gait/ ext warm without deformities, calf tenderness, cyanosis or clubbing No obvious joint restrictions   SKIN: warm and dry without lesions    NEURO:  alert, approp, nl sensorium with  no motor or cerebellar deficits apparent.           Assessment & Plan:

## 2018-05-16 ENCOUNTER — Encounter: Payer: Self-pay | Admitting: Internal Medicine

## 2018-05-16 NOTE — Assessment & Plan Note (Addendum)
Onset in Childhood / recurred in his 40s Spirometry 06/26/2016  FEV1 2.35  (90%)  Ratio 70 with min curvature p am advair - FENO 06/26/2016  =   18 p am advair  - 06/26/2016   so try symb 80 2bid and stop advair - 10/29/2016    reduce symb back to 80 2bid and continue gerd x 3 months - 01/28/2017 flared on symb 80 with lots of upper air way symptoms  - FENO 01/28/2017  =   20 on symb 80 so rec trial of 160 2bid   - 01/28/2017  After extensive coaching HFA effectiveness =    90%  - Allergy profile 01/28/2017 >  Eos 0.4/  IgE  49  RAST pos dust Cat/tree/ ragweed - FENO 05/10/2018  =   22 on symb 160 2 hs and prn daytime dosing    All goals of chronic asthma control met including optimal function and elimination of symptoms with minimal need for rescue therapy.  Contingencies discussed in full including contacting this office immediately if not controlling the symptoms using the rule of two's.     Based on two studies from NEJM  378; 20 p 1865 (2018) and 380 : p2020-30 (2019) in pts with mild asthma it is reasonable to use low dose symbicort eg 80 2bid "prn" flare in this setting but I emphasized this was only shown with symbicort and takes advantage of the rapid onset of action but is not the same as "rescue therapy" but can be stopped once the acute symptoms have resolved and the need for rescue has been minimized (< 2 x weekly)     F/u can be q 6 m

## 2018-10-20 ENCOUNTER — Other Ambulatory Visit: Payer: Self-pay | Admitting: Internal Medicine

## 2018-11-08 ENCOUNTER — Ambulatory Visit (INDEPENDENT_AMBULATORY_CARE_PROVIDER_SITE_OTHER): Payer: Medicare Other

## 2018-11-08 ENCOUNTER — Encounter: Payer: Self-pay | Admitting: Internal Medicine

## 2018-11-08 ENCOUNTER — Ambulatory Visit (INDEPENDENT_AMBULATORY_CARE_PROVIDER_SITE_OTHER): Payer: Medicare Other | Admitting: Internal Medicine

## 2018-11-08 ENCOUNTER — Other Ambulatory Visit: Payer: Self-pay

## 2018-11-08 DIAGNOSIS — R0609 Other forms of dyspnea: Secondary | ICD-10-CM

## 2018-11-08 DIAGNOSIS — J45991 Cough variant asthma: Secondary | ICD-10-CM

## 2018-11-08 MED ORDER — BUDESONIDE-FORMOTEROL FUMARATE 80-4.5 MCG/ACT IN AERO: 2.0000 | INHALATION_SPRAY | Freq: Two times a day (BID) | RESPIRATORY_TRACT | 11 refills | Status: DC

## 2018-11-08 MED ORDER — BEVESPI AEROSPHERE 9-4.8 MCG/ACT IN AERO: 2.0000 | INHALATION_SPRAY | Freq: Two times a day (BID) | RESPIRATORY_TRACT | 0 refills | Status: DC

## 2018-11-08 MED ORDER — BUDESONIDE-FORMOTEROL FUMARATE 80-4.5 MCG/ACT IN AERO: 2.0000 | INHALATION_SPRAY | Freq: Two times a day (BID) | RESPIRATORY_TRACT | 0 refills | Status: DC

## 2018-11-08 NOTE — Patient Instructions (Addendum)
Plan A = Automatic = symbicort 80 Take 2 puffs first thing in am and then another 2 puffs about 12 hours later - ok to take the pm dose 15 min before any late day (your 7pm) exercise  Work on inhaler technique:  relax and gently blow all the way out then take a nice smooth deep breath back in, triggering the inhaler at same time you start breathing in.  Hold for up to 5 seconds if you can. Blow symbicort out thru nose. Rinse and gargle with water when done     Plan B = Backup Only use your albuterol inhaler as a rescue medication to be used if you can't catch your breath by resting or doing a relaxed purse lip breathing pattern.  - The less you use it, the better it will work when you need it. - Ok to use the inhaler up to 2 puffs  every 4 hours if you must but call for appointment if use goes up over your usual need - Don't leave home without it !!  (think of it like the spare tire for your car)      Please remember to go to the  x-ray department  for your tests - we will call you with the results when they are available     Please schedule a follow up visit in 3 months but call sooner if needed

## 2018-11-08 NOTE — Progress Notes (Signed)
Subjective:   Patient ID: Andrew Moran, male    DOB: [birth year],   MRN: [id number]     Brief patient profile:  2    yow[age] never smoker dx as asthma as child growing up in [place] mostly in winter months very limited activity due to sob and summer better s limits improved in his 91s then recurred in his 23's on prn saba but in 2000's required daily treatment with advair but only helped a little and still needed freq saba and saw Dr Bernita Buffy with no resp to shots so self referred to pulmonary clinic 06/26/2016 for refractory cough /sob with nl spirometry at initial ov.     History of Present Illness  06/26/2016 1st [facility] office visit/ Andrew Moran   Chief Complaint  Patient presents with  . Advice Only    self referral for emphysema.  worsening sob, nonprod cough Xseveral months.   ? Chronic asthma seen here  By me and By Dr Joya Gaskins around 2006 but does not remember seeing [place] and whether this helped but now comes in with worsening dry cough / throat irritation and doe indolent onset gradually worse x 3 m esp p supper  But does ok sleeping at 45 degrees  s nasal symptoms at all some better p saba  rec Stop advair  Plan A = Automatic = symbicort 80 Take 2 puffs first thing in am and then another 2 puffs about 12 hours later.  Work on inhaler technique: Plan B = Backup Only use your albuterol as a rescue medication GERD diet   Add : Pantoprazole (protonix) 40 mg   Take  30-60 min before first meal of the day and Pepcid (famotidine)  20 mg one @  Bedtime     07/30/2016  f/u ov/Andrew Moran re:  Cough variant asthma vs uacs/ much better on gerd rx plus back on advair 250 p ran out of symbicort 80 Chief Complaint  Patient presents with  . Follow-up    Pt states breathing has improved some and he is coughing less. Voice still sounds worse.   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                 And continue the reflux medications  Plan B = Backup Only use your albuterol (Proair)  as a rescue medication        01/28/2017  f/u ov/Andrew Moran re:  Cough varant asthma on symb 80 2bid  Chief Complaint  Patient presents with  . Follow-up    Increased cough since the weather turned cold "tickle in throat". Cough is non prod. He uses his albuterol inhaler 2-3 x per wk on average.   was doing great on symb 80 2bid and gerd rx and no problems with exertion or sleeping or need for saba Then with onset cold weather gradually worse cough/ sob and started needing rescue by 8 h p symbicort each afternoon  Does fine overnight  rec Try symbicort 160 Take 2 puffs first thing in am and then another 2 puffs about 12 hours later.  If the symbicort 160 helps you  with your winter cough and reduces your need for albuterol in the afternoons then continue the 160 but call for prescription - if not, then just continue the 80 strength       11/09/2017  f/u ov/Andrew Moran re:  Cough variant with ? Component uacs/ doing better  on symb 160 than 80 Chief Complaint  Patient presents with  . Follow-up    Pt states he has been doing good since last visit. Pt states he did have some mild complaints of SOB x1week ago and had so use his rescue inhaler a couple of times and wonders if he ate something that didn't agree with him.  Dyspnea:  Not limited by breathing from desired activities   Cough: minimal Sleeping: ok / slt elevation hob SABA use: rarely  Not using ppi prn now s many flares  rec For itching/ sneezing/ runny nose > zyrtec or clariton/allegra only as needed  At the onset of any cough/ throat clearing immediately start back on protonix 40 mg Take 30-60 min before first meal of the day     05/10/2018  f/u  ov/Andrew Moran re: cough variant asthma on symb 160 x 2 hs /  1 or 2 in am "prn" Chief Complaint  Patient presents with  . Follow-up    Breathing is overall doing well. He has not had to use his albuterol inhaler.   Dyspnea:  Not limited by breathing from desired activities   Cough: no, just sometimes with talking  Sleeping: in a recliner due to shoulder  SABA use: not using saba  rec Plan A = Automatic = symbiocrt 160 up to 2 pffs every 12 hours  Plan B = Backup Only use your albuterol inhaler as a rescue medication    11/08/2018  f/u ov/Andrew Moran re:  Cough variant asthma ? Cough  Better / but breathing is not better  Chief Complaint  Patient presents with  . Follow-up    He has occ throat clearing- more in the mornings.   Dyspnea:  Walks the neighborhood x 20-25 min then around 7 pm starts having tightness nightly since last visit takes symb 160 x 2 pffs at hs only  Cough: in am's clearing the throat but does not wake up prematurely or produce much mucus at all now  Sleeping: in recliner x 30 degrees due to shoulders  SABA use: rarely  02: none   No obvious day to day or daytime variability or assoc excess/ purulent sputum or mucus plugs or hemoptysis or cp or chest tightness, subjective wheeze or overt sinus or hb symptoms.   sleepoing as above without nocturnal  or early am exacerbation  of respiratory  c/o's or need for noct saba. Also denies any obvious fluctuation of symptoms with weather or environmental changes or other aggravating or alleviating factors except as outlined above   No unusual exposure hx or h/o childhood pna/  knowledge of premature birth.  Current Allergies, Complete Past Medical History, Past Surgical History, Family History, and Social History were reviewed in [employer] record.  ROS  The following are not active complaints unless bolded Hoarseness, sore throat, dysphagia, dental problems, itching, sneezing,  nasal congestion or discharge  of excess mucus or purulent secretions, ear ache,   fever, chills, sweats, unintended wt loss or wt gain, classically pleuritic or exertional cp,  orthopnea pnd or arm/hand swelling  or leg swelling, presyncope, palpitations, abdominal pain, anorexia, nausea, vomiting, diarrhea  or change in bowel habits or change  in bladder habits, change in stools or change in urine, dysuria, hematuria,  rash, arthralgias, visual complaints, headache, numbness, weakness or ataxia or problems with walking or coordination,  change in mood or  memory.        Current Meds  Medication Sig  . albuterol (PROVENTIL HFA;VENTOLIN HFA) 108 (90 BASE) MCG/ACT inhaler Inhale 2 puffs into the lungs every 4 (four) hours as needed for wheezing or shortness of breath. Wheezing   . atorvastatin (LIPITOR) 20 MG tablet Take 20 mg by mouth at bedtime.   . budesonide-formoterol (SYMBICORT) 160-4.5 MCG/ACT inhaler INHALE 2 PUFFS BY MOUTH TWO TIMES A DAY  . Multiple Vitamins-Minerals (MULTIVITAMINS THER. W/MINERALS) TABS Take 1 tablet by mouth daily.   . naproxen sodium (ANAPROX) 220 MG tablet Take 440 mg by mouth 2 (two) times daily as needed (pain).  Marland Kitchen OVER THE COUNTER MEDICATION Take 2-3 tablets by mouth daily. Papaya complex enzyme  . OVER THE COUNTER MEDICATION Take 2 tablets by mouth at bedtime. Deglycyrrhizinated-DGL  . Probiotic Product (PROBIOTIC FORMULA PO) Take 1 capsule by mouth daily.                             Objective:   Physical Exam  amb wm nad very evasive historian / multiple re-directs so sort out specific symptoms    11/08/2018        05/10/2018        178  11/09/2017        183  05/11/2017        184  01/28/2017      180  10/29/2016        174   06/26/16 182 lb (82.6 kg)  10/10/13 181 lb (82.1 kg)  09/30/13 181 lb (82.1 kg)      Vital signs reviewed - Note on arrival 02 sats  99% on RA   HEENT: nl dentition, turbinates bilaterally, and oropharynx. Nl external ear canals without cough  reflex   NECK :  without JVD/Nodes/TM/ nl carotid upstrokes bilaterally   LUNGS: no acc muscle use,  Nl contour chest which is clear to A and P bilaterally without cough on insp or exp maneuvers   CV:  RRR  no s3 or murmur or increase in P2, and no edema   ABD:  soft and nontender with nl inspiratory excursion in the supine position. No bruits or organomegaly appreciated, bowel sounds nl  MS:  Nl gait/ ext warm without deformities, calf tenderness, cyanosis or clubbing No obvious joint restrictions   SKIN: warm and dry without lesions    NEURO:  alert, approp, nl sensorium with  no motor or cerebellar deficits apparent.       CXR PA and Lateral:   11/08/2018 :    I personally reviewed images and agree with radiology impression as follows:    COPD. No active cardiopulmonary disease.         Assessment & Plan:

## 2018-11-09 ENCOUNTER — Encounter: Payer: Self-pay | Admitting: Internal Medicine

## 2018-11-09 DIAGNOSIS — R0609 Other forms of dyspnea: Secondary | ICD-10-CM | POA: Insufficient documentation

## 2018-11-09 NOTE — Assessment & Plan Note (Signed)
Onset in Childhood / recurred in his 40s Spirometry 06/26/2016  FEV1 2.35  (90%)  Ratio 70 with min curvature p am advair - FENO 06/26/2016  =   18 p am advair  - 06/26/2016   so try symb 80 2bid and stop advair - 10/29/2016    reduce symb back to 80 2bid and continue gerd x 3 months - 01/28/2017 flared on symb 80 with lots of upper airway symptoms  - FENO 01/28/2017  =   20 on symb 80 with flare of symptoms  so rec trial of 160 2bid   - Allergy profile 01/28/2017 >  Eos 0.4/  IgE  49  RAST pos dust /Cat/tree/ ragweed - FENO 05/10/2018  =   22 on symb 160 2 hs and prn daytime dosing  Cough is better but still hoarse/ clearing his throat which may either be due to high dose ics or gerd or both.  Either might explain his sympotms p supper as he uses the 160 x 2 puffs more than 12 hours prior to his daily walk, which occurs p supper  - The proper method of use, as well as anticipated side effects, of a metered-dose inhaler are discussed and demonstrated to the patient. Improved effectiveness after extensive coaching during this visit to a level of approximately 50 % from a baseline of 75 %  >>>> try sym 80 2bid again and if still having ex symptoms then add gerd rx

## 2018-11-09 NOTE — Progress Notes (Signed)
Spoke with pt and notified of results per Dr. Wert. Pt verbalized understanding and denied any questions. 

## 2018-11-09 NOTE — Assessment & Plan Note (Addendum)
Never smoker, onset in early 2000s with nl spirometry 06/26/16  - 11/08/2018   Walked RA  2 laps @  approx 259ft each @ fast pace  stopped due to  End of study, min  sob and no  chest tightness  18 h p last symb 160   Will try the lower strength of sym 2 bid and ask him to continue pm ex and call if not improving - ok to use the pm dose 15 min before ex on trial basis   If not better add gerd rx and if persists > cards eval.    I had an extended discussion with the patient reviewing all relevant studies completed to date and  lasting 25 minutes of a 40  minute office  visit addressing several different  non-specific but potentially very serious refractory respiratory symptoms of uncertain and potentially multiple  Etiologies.   I directly observed portions of ambulatory 02 saturation study/ I performed device teaching  using a teach back technique which also  extended face to face time for this visit (see above)    Each maintenance medication was reviewed in detail including most importantly the difference between maintenance and prns and under what circumstances the prns are to be triggered using an action plan format that is not reflected in the computer generated alphabetically organized AVS.    Please see AVS for specific instructions unique to this office visit that I personally wrote and verbalized to the the pt in detail and then reviewed with pt  by my nurse highlighting any changes in therapy/plan of care  recommended at today's visit.

## 2019-02-07 ENCOUNTER — Telehealth: Payer: Self-pay | Admitting: Internal Medicine

## 2019-02-07 NOTE — Telephone Encounter (Signed)
Spoke with pt, aware of recs.  Nothing further needed at this time- will close encounter.   

## 2019-02-07 NOTE — Telephone Encounter (Signed)
Spoke with the pt  He states that he remembers a few years ago Dr Melvyn Novas had mentioned "something suspicious" was on his cxr  He had one done back in August and nothing was mentioned about this  He is asking what this was and if it has completely resolved  Has pending appt in Jan 2021  Please advise thanks!

## 2019-02-07 NOTE — Telephone Encounter (Signed)
I reviewed all the recent cxr's and ct scan back to 2015 and I see there was a tiny amt of fluid suspected under one of his lungs but it's not present on subsequent films so hard to know what it was but no further attention to this needed and should keep f/u appt as planned, call sooner if needed

## 2019-02-08 ENCOUNTER — Ambulatory Visit: Payer: Medicare Other | Admitting: Internal Medicine

## 2019-03-21 ENCOUNTER — Other Ambulatory Visit: Payer: Self-pay

## 2019-03-21 ENCOUNTER — Encounter: Payer: Self-pay | Admitting: Internal Medicine

## 2019-03-21 ENCOUNTER — Ambulatory Visit: Payer: Medicare Other | Admitting: Internal Medicine

## 2019-03-21 DIAGNOSIS — J45991 Cough variant asthma: Secondary | ICD-10-CM | POA: Diagnosis not present

## 2019-03-21 NOTE — Patient Instructions (Addendum)
No change in medications   Strongly recommend you get the vaccine when available  Please schedule a follow up visit in 6  months but call sooner if needed

## 2019-03-21 NOTE — Progress Notes (Signed)
Subjective:   Patient ID: Andrew Moran, male    DOB: [birth year],   MRN: [id number]     Brief patient profile:  2    yowm never smoker dx as asthma as child growing up in [place] mostly in winter months very limited activity due to sob and summer better s limits improved in his 91s then recurred in his 23's on prn saba but in 2000's required daily treatment with advair but only helped a little and still needed freq saba and saw Dr Bernita Buffy with no resp to shots so self referred to pulmonary clinic 06/26/2016 for refractory cough /sob with nl spirometry at initial ov.     History of Present Illness  06/26/2016 1st [facility] office visit/ Andrew Moran   Chief Complaint  Patient presents with  . Advice Only    self referral for emphysema.  worsening sob, nonprod cough Xseveral months.   ? Chronic asthma seen here  By me and By Dr Joya Gaskins around 2006 but does not remember seeing [place] and whether this helped but now comes in with worsening dry cough / throat irritation and doe indolent onset gradually worse x 3 m esp p supper  But does ok sleeping at 45 degrees  s nasal symptoms at all some better p saba  rec Stop advair  Plan A = Automatic = symbicort 80 Take 2 puffs first thing in am and then another 2 puffs about 12 hours later.  Work on inhaler technique: Plan B = Backup Only use your albuterol as a rescue medication GERD diet   Add : Pantoprazole (protonix) 40 mg   Take  30-60 min before first meal of the day and Pepcid (famotidine)  20 mg one @  Bedtime     07/30/2016  f/u ov/Andrew Moran re:  Cough variant asthma vs uacs/ much better on gerd rx plus back on advair 250 p ran out of symbicort 80 Chief Complaint  Patient presents with  . Follow-up    Pt states breathing has improved some and he is coughing less. Voice still sounds worse.   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                 And continue the reflux medications  Plan B = Backup Only use your albuterol (Proair)  as a rescue medication        01/28/2017  f/u ov/Andrew Moran re:  Cough varant asthma on symb 80 2bid  Chief Complaint  Patient presents with  . Follow-up    Increased cough since the weather turned cold "tickle in throat". Cough is non prod. He uses his albuterol inhaler 2-3 x per wk on average.   was doing great on symb 80 2bid and gerd rx and no problems with exertion or sleeping or need for saba Then with onset cold weather gradually worse cough/ sob and started needing rescue by 8 h p symbicort each afternoon  Does fine overnight  rec Try symbicort 160 Take 2 puffs first thing in am and then another 2 puffs about 12 hours later.  If the symbicort 160 helps you  with your winter cough and reduces your need for albuterol in the afternoons then continue the 160 but call for prescription - if not, then just continue the 80 strength       11/09/2017  f/u ov/Andrew Moran re:  Cough variant with ? Component uacs/ doing better  on symb 160 than 80 Chief Complaint  Patient presents with  . Follow-up    Pt states he has been doing good since last visit. Pt states he did have some mild complaints of SOB x1week ago and had so use his rescue inhaler a couple of times and wonders if he ate something that didn't agree with him.  Dyspnea:  Not limited by breathing from desired activities   Cough: minimal Sleeping: ok / slt elevation hob SABA use: rarely  Not using ppi prn now s many flares  rec For itching/ sneezing/ runny nose > zyrtec or clariton/allegra only as needed  At the onset of any cough/ throat clearing immediately start back on protonix 40 mg Take 30-60 min before first meal of the day     05/10/2018  f/u  ov/Andrew Moran re: cough variant asthma on symb 160 x 2 hs /  1 or 2 in am "prn" Chief Complaint  Patient presents with  . Follow-up    Breathing is overall doing well. He has not had to use his albuterol inhaler.   Dyspnea:  Not limited by breathing from desired activities   Cough: no, just sometimes with talking  Sleeping: in a recliner due to shoulder  SABA use: not using saba  rec Plan A = Automatic = symbiocrt 160 up to 2 pffs every 12 hours  Plan B = Backup Only use your albuterol inhaler as a rescue medication    11/08/2018  f/u ov/Andrew Moran re:  Cough variant asthma ? Cough  Better / but breathing is not better  Chief Complaint  Patient presents with  . Follow-up    He has occ throat clearing- more in the mornings.   Dyspnea:  Walks the neighborhood x 20-25 min then around 7 pm starts having tightness nightly since last visit takes symb 160 x 2 pffs at hs only  Cough: in am's clearing the throat but does not wake up prematurely or produce much mucus at all now  Sleeping: in recliner x 30 degrees due to shoulders  SABA use: rarely  02: none rec Plan A = Automatic = symbicort 80 Take 2 puffs first thing in am and then another 2 puffs about 12 hours later - ok to take the pm dose 15 min before any late day (your 7pm) exercise Work on inhaler technique:  Plan B = Backup Only use your albuterol inhaler as a rescue medication    03/21/2019  f/u ov/Andrew Moran re: cough variant asthma on symb 80 2bid  Chief Complaint  Patient presents with  . Follow-up    no symptoms  Dyspnea:  Not limited by breathing from desired activities   Cough: esp in am  Sleeping: 30 degrees recliner due to shoulder pain SABA use: rarely  02: none   No obvious day to day or daytime variability or assoc excess/ purulent sputum or mucus plugs or hemoptysis or cp or chest tightness, subjective wheeze or overt sinus or hb symptoms.   Sleeping as avoid  without nocturnal  or early am exacerbation  of respiratory  c/o's  or need for noct saba. Also denies any obvious fluctuation of symptoms with weather or environmental changes or other  aggravating or alleviating factors except as outlined above   No unusual exposure hx or h/o childhood pna  or knowledge of premature birth.  Current Allergies, Complete Past Medical History, Past Surgical History, Family History, and Social History were reviewed in [employer] record.  ROS  The following are not active complaints unless bolded Hoarseness, sore throat, dysphagia, dental problems, itching, sneezing,  nasal congestion or discharge of excess mucus or purulent secretions, ear ache,   fever, chills, sweats, unintended wt loss or wt gain, classically pleuritic or exertional cp,  orthopnea pnd or arm/hand swelling  or leg swelling, presyncope, palpitations, abdominal pain, anorexia, nausea, vomiting, diarrhea  or change in bowel habits or change in bladder habits, change in stools or change in urine, dysuria, hematuria,  rash, arthralgias, visual complaints, headache, numbness, weakness or ataxia or problems with walking or coordination,  change in mood or  memory.        Current Meds  Medication Sig  . albuterol (PROVENTIL HFA;VENTOLIN HFA) 108 (90 BASE) MCG/ACT inhaler Inhale 2 puffs into the lungs every 4 (four) hours as needed for wheezing or shortness of breath. Wheezing   . atorvastatin (LIPITOR) 20 MG tablet Take 20 mg by mouth at bedtime.   . budesonide-formoterol (SYMBICORT) 80-4.5 MCG/ACT inhaler Inhale 2 puffs into the lungs 2 (two) times daily.  . Multiple Vitamins-Minerals (MULTIVITAMINS THER. W/MINERALS) TABS Take 1 tablet by mouth daily.   . naproxen sodium (ANAPROX) 220 MG tablet Take 440 mg by mouth 2 (two) times daily as needed (pain).  Marland Kitchen OVER THE COUNTER MEDICATION Take 2-3 tablets by mouth daily. Papaya complex enzyme  . OVER THE COUNTER MEDICATION Take 2 tablets by mouth at bedtime. Deglycyrrhizinated-DGL  . Probiotic Product  (PROBIOTIC FORMULA PO) Take 1 capsule by mouth daily.               Objective:   Physical Exam   amb slt hoarse wm nad    03/21/2019          184  05/10/2018        178  11/09/2017        183  05/11/2017        184  01/28/2017      180  10/29/2016        174   06/26/16 182 lb (82.6 kg)  10/10/13 181 lb (82.1 kg)  09/30/13 181 lb (82.1 kg)     Vital signs reviewed - Note on arrival 02 sats  97% on RA      HEENT : pt wearing mask not removed for exam due to covid -19 concerns.    NECK :  without JVD/Nodes/TM/ nl carotid upstrokes bilaterally   LUNGS: no acc muscle use,  Nl contour chest which is clear to A and P bilaterally without cough on insp or exp maneuvers   CV:  RRR  no s3 or murmur or increase in P2, and no edema   ABD:  soft and nontender with nl inspiratory excursion in the supine position. No bruits or organomegaly appreciated, bowel sounds nl  MS:  Nl gait/ ext warm without deformities, calf tenderness, cyanosis or clubbing No obvious joint restrictions   SKIN: warm and dry without lesions    NEURO:  alert, approp, nl sensorium with  no motor or cerebellar deficits apparent.             Assessment & Plan:

## 2019-03-23 ENCOUNTER — Encounter: Payer: Self-pay | Admitting: Internal Medicine

## 2019-03-23 NOTE — Assessment & Plan Note (Signed)
Onset in Childhood / recurred in his 76s Spirometry 06/26/2016  FEV1 2.35  (90%)  Ratio 70 with min curvature p am advair - FENO 06/26/2016  =   18 p am advair  - 06/26/2016   so try symb 80 2bid and stop advair - 10/29/2016    reduce symb back to 80 2bid and continue gerd x 3 months - 01/28/2017 flared on symb 80 with lots of upper airway symptoms  - FENO 01/28/2017  =   20 on symb 80 with flare of symptoms  so rec trial of 160 2bid   - Allergy profile 01/28/2017 >  Eos 0.4/  IgE  49  RAST pos dust /Cat/tree/ ragweed - FENO 05/10/2018  =   22 on symb 160 2 hs and prn daytime dosing   All goals of chronic asthma control met including optimal function and elimination of symptoms with minimal need for rescue therapy.  Contingencies discussed in full including contacting this office immediately if not controlling the symptoms using the rule of two's.     Pt informed of the seriousness of COVID 19 infection as a direct risk to lung health  and safey and to close contacts and should continue to wear a facemask in public and minimize exposure to public locations but especially avoid any area or activity where non-close contacts are not observing distancing or wearing an appropriate face mask.  I strongly recommended vaccine when offered.     >>> f/u q 32m call sooner if needed          Each maintenance medication was reviewed in detail including emphasizing most importantly the difference between maintenance and prns and under what circumstances the prns are to be triggered using an action plan format that is not reflected in the computer generated alphabetically organized AVS which I have not found useful in most complex patients, especially with respiratory illnesses  Total time for H and P, chart review, counseling,   and generating AVS / charting =  20 min

## 2019-03-30 NOTE — H&P (Signed)
TOTAL KNEE ADMISSION H&P  Patient is being admitted for left total knee arthroplasty.  Subjective:  Chief Complaint:left knee pain.  HPI: Andrew Moran, 74 y.o. male, has a history of pain and functional disability in the left knee due to arthritis and has failed non-surgical conservative treatments for greater than 12 weeks to includeNSAID's and/or analgesics, corticosteriod injections, viscosupplementation injections, flexibility and strengthening excercises, supervised PT with diminished ADL's post treatment and activity modification.  Onset of symptoms was gradual, starting >10 years ago with gradually worsening course since that time. The patient noted prior procedures on the knee to include  arthroscopy and menisectomy on the left knee(s).  Patient currently rates pain in the left knee(s) at 4 out of 10 with activity. Patient has night pain, worsening of pain with activity and weight bearing, pain that interferes with activities of daily living, pain with passive range of motion and crepitus.  Patient has evidence of subchondral sclerosis, periarticular osteophytes and joint space narrowing by imaging studies. This patient has had No previous injuries. There is no active infection.  Patient Active Problem List   Diagnosis Date Noted  . DOE (dyspnea on exertion) 11/09/2018  . Cough variant asthma vs UACS  06/27/2016   Past Medical History:  Diagnosis Date  . Asthma   . Back fracture 1988   L3 TO L 4  . Diverticulitis 2002  . Fracture, clavicle 1981   LEFT CLAVICLE  . GERD (gastroesophageal reflux disease)   . Kidney stone 1967    Past Surgical History:  Procedure Laterality Date  . ANKLE SURGERY  1988   RIGHT FOOT WITH SCREWS  . ANKLE SURGERY  2008   SCREWS REMOVED FROM RIGHT FOOT  . BACK SURGERY  1995   LOWER BACK  . BACK SURGERY  1995   2ND BACK SURGERY LOWER BACK  . CARPAL TUNNEL RELEASE  1984   RIGHT   . CARPAL TUNNEL RELEASE  1983   LEFT HAND  .  CYSTOSCOPY/RETROGRADE/URETEROSCOPY/STONE EXTRACTION WITH BASKET Bilateral 10/10/2013   Procedure: CYSTOSCOPY/URETEROSCOPY/STONE EXTRACTION WITH HOLMIUM LASER, bilateral retrograde, bilateral ureteral stents;  Surgeon: Jorja Loa, MD;  Location: [facility];  Service: Urology;  Laterality: Bilateral;  . EXPLORATORY LAPAROTOMY  1970   EXPLORATORY STOMACH SURGERY FOR TEAR IN STOMACH WALL  . HOLMIUM LASER APPLICATION Left 0000000   Procedure: HOLMIUM LASER APPLICATION;  Surgeon: Jorja Loa, MD;  Location: [facility];  Service: Urology;  Laterality: Left;  . KNEE ARTHROSCOPY  02/05/2011   Procedure: ARTHROSCOPY KNEE;  Surgeon: Laurice Record Aplington;  Location: [facility];  Service: Orthopedics;  Laterality: Right;  Right knee arthroscopy partial lateral and medial meniscectomy with condyle debridement  . LITHOTRIPSY  1986  . LITHOTRIPSY  2001  . LITHOTRIPSY  2005  . LITHOTRIPSY  2008  . LOWER BACK INJECTION  2002  . LOWER BACK INJECTION  2011  . ROTATOR CUFF REPAIR  1997   RIGHT  . ROTATOR CUFF REPAIR  1997   LEFT  . ROTATOR CUFF REPAIR Right 02/16/2018  . TONSILLECTOMY  1951    AND ADENOIDS    No current facility-administered medications for this encounter.   Current Outpatient Medications  Medication Sig Dispense Refill Last Dose  . albuterol (PROVENTIL HFA;VENTOLIN HFA) 108 (90 BASE) MCG/ACT inhaler Inhale 2 puffs into the lungs every 4 (four) hours as needed for wheezing or shortness of breath. Wheezing      . atorvastatin (LIPITOR) 20 MG tablet Take 20 mg by mouth at bedtime.      Marland Kitchen  budesonide-formoterol (SYMBICORT) 80-4.5 MCG/ACT inhaler Inhale 2 puffs into the lungs 2 (two) times daily. 1 Inhaler 11   . Multiple Vitamins-Minerals (MULTIVITAMINS THER. W/MINERALS) TABS Take 1 tablet by mouth daily.      . naproxen sodium (ANAPROX) 220 MG tablet Take 440 mg by mouth 2 (two) times daily as needed (pain).     Marland Kitchen OVER THE COUNTER MEDICATION Take 2-3 tablets by mouth daily. Papaya complex  enzyme     . OVER THE COUNTER MEDICATION Take 2 tablets by mouth at bedtime. Deglycyrrhizinated-DGL     . Probiotic Product (PROBIOTIC FORMULA PO) Take 1 capsule by mouth daily.       Allergies  Allergen Reactions  . Zithromax [Azithromycin] Shortness Of Breath    Social History   Tobacco Use  . Smoking status: Never Smoker  . Smokeless tobacco: Never Used  Substance Use Topics  . Alcohol use: Yes    Comment: OCCASIONAL    Family History  Problem Relation Age of Onset  . Heart attack Father      Review of Systems  Constitutional: Negative.   HENT: Negative.   Eyes: Negative.   Respiratory: Negative.   Cardiovascular: Negative.   Gastrointestinal: Negative.   Endocrine: Negative.   Genitourinary: Negative.   Musculoskeletal: Positive for arthralgias, joint swelling and myalgias.  Skin: Negative.   Allergic/Immunologic: Negative.   Neurological: Negative.   Hematological: Negative.   Psychiatric/Behavioral: Negative.     Objective:  Physical Exam  Constitutional: He is oriented to person, place, and time. He appears well-developed and well-nourished. No distress.  HENT:  Head: Normocephalic and atraumatic.  Eyes: Pupils are equal, round, and reactive to light.  Neck: No JVD present. No tracheal deviation present. No thyromegaly present.  Cardiovascular: Normal rate, regular rhythm, normal heart sounds and intact distal pulses. Exam reveals no gallop and no friction rub.  No murmur heard. Respiratory: Effort normal and breath sounds normal. No respiratory distress. He has no wheezes. He has no rales. He exhibits no tenderness.  GI: Soft. There is no abdominal tenderness. There is no rebound.  Musculoskeletal:     Cervical back: Normal range of motion and neck supple.     Comments: Tenderness on medial and lateral joint line ROM 0-100  5/5 resisted flexion and extension  Neurological: He is alert and oriented to person, place, and time.  Skin: Skin is warm and  dry. No rash noted. No erythema. No pallor.  Psychiatric: He has a normal mood and affect. His behavior is normal. Judgment and thought content normal.    Vital signs in last 24 hours:    Labs:   Estimated body mass index is 30.69 kg/m as calculated from the following:   Height as of 03/21/19: 5\' 5"  (1.651 m).   Weight as of 03/21/19: 83.6 kg.   Imaging Review Plain radiographs demonstrate moderate degenerative joint disease of the left knee(s). The overall alignment ismild valgus. The bone quality appears to be good for age and reported activity level.      Assessment/Plan:  End stage arthritis, left knee   The patient history, physical examination, clinical judgment of the provider and imaging studies are consistent with end stage degenerative joint disease of the left knee(s) and total knee arthroplasty is deemed medically necessary. The treatment options including medical management, injection therapy arthroscopy and arthroplasty were discussed at length. The risks and benefits of total knee arthroplasty were presented and reviewed. The risks due to aseptic loosening, infection, stiffness, patella tracking  problems, thromboembolic complications and other imponderables were discussed. The patient acknowledged the explanation, agreed to proceed with the plan and consent was signed. Patient is being admitted for inpatient treatment for surgery, pain control, PT, OT, prophylactic antibiotics, VTE prophylaxis, progressive ambulation and ADL's and discharge planning. The patient is planning to be discharged home same day as surgery. Will have home health set up for patient     Patient's anticipated LOS is less than 2 midnights, meeting these requirements: - Younger than 61 - Lives within 1 hour of care - Has a competent adult at home to recover with post-op recover - NO history of  - Chronic pain requiring opiods  - Diabetes  - Coronary Artery Disease  - Heart failure  - Heart  attack  - Stroke  - DVT/VTE  - Cardiac arrhythmia  - Respiratory Failure/COPD  - Renal failure  - Anemia  - Advanced Liver disease

## 2019-04-05 ENCOUNTER — Other Ambulatory Visit (HOSPITAL_COMMUNITY)
Admission: RE | Admit: 2019-04-05 | Discharge: 2019-04-05 | Disposition: A | Discharge status: Home / Self Care | Payer: Medicare Other | Source: Ambulatory Visit | Attending: Specialist | Admitting: Specialist

## 2019-04-05 DIAGNOSIS — Z01812 Encounter for preprocedural laboratory examination: Secondary | ICD-10-CM | POA: Insufficient documentation

## 2019-04-05 DIAGNOSIS — Z20822 Contact with and (suspected) exposure to covid-19: Secondary | ICD-10-CM | POA: Diagnosis not present

## 2019-04-06 LAB — NOVEL CORONAVIRUS, NAA (HOSP ORDER, SEND-OUT TO REF LAB; TAT 18-24 HRS): SARS-CoV-2, NAA: NOT DETECTED

## 2019-04-06 NOTE — Patient Instructions (Signed)
DUE TO COVID-19 ONLY ONE VISITOR IS ALLOWED TO COME WITH YOU AND STAY IN THE WAITING ROOM ONLY DURING PRE OP AND PROCEDURE DAY OF SURGERY. THE 1 VISITOR MAY VISIT WITH YOU AFTER SURGERY IN YOUR PRIVATE ROOM DURING VISITING HOURS ONLY!  YOU NEED TO HAVE A COVID 19 TEST ON_______ @_______ , THIS TEST MUST BE DONE BEFORE SURGERY, COME  [place], [place] , [id number].  ([place]) ONCE YOUR COVID TEST IS COMPLETED, PLEASE BEGIN THE QUARANTINE INSTRUCTIONS AS OUTLINED IN YOUR HANDOUT.                Andrew Moran   Your procedure is scheduled on: 04/08/19   Report to [facility] Main  Entrance   Report to Short Stay at 5:30 AM     Call this number if you have problems the morning of surgery [phone]   . BRUSH YOUR TEETH MORNING OF SURGERY AND RINSE YOUR MOUTH OUT, NO CHEWING GUM CANDY OR MINTS.   Do not eat food After Midnight.   YOU MAY HAVE CLEAR LIQUIDS FROM MIDNIGHT UNTIL 4:30AM.   At 4:30AM Please finish the prescribed Pre-Surgery  drink.   Nothing by mouth after you finish the  drink !   Take these medicines the morning of surgery with A SIP OF WATER:  No meds need to be taken but you can use your inhalers and bring them with you to the hospital                                You may not have any metal on your body including               piercings  Do not wear jewelry,  lotions, powders or  deodorant                   Men may shave face and neck.   Do not bring valuables to the hospital. [place].  Contacts, dentures or bridgework may not be worn into surgery.      Patients discharged the day of surgery will not be allowed to drive home.  IF YOU ARE HAVING SURGERY AND GOING HOME THE SAME DAY, YOU MUST HAVE AN ADULT TO DRIVE YOU HOME AND BE WITH YOU FOR 24 HOURS.  YOU MAY GO HOME BY TAXI OR UBER OR ORTHERWISE, BUT AN ADULT MUST ACCOMPANY YOU HOME AND STAY WITH YOU FOR 24  HOURS.  Name and phone number of your driver:  Special Instructions: N/A              Please read over the following fact sheets you were given: _____________________________________________________________________             [facility] - Preparing for Surgery  Before surgery, you can play an important role.   Because skin is not sterile, your skin needs to be as free of germs as possible.   You can reduce the number of germs on your skin by washing with CHG (chlorahexidine gluconate) soap before surgery.   CHG is an antiseptic cleaner which kills germs and bonds with the skin to continue killing germs even after washing. Please DO NOT use if you have an allergy to CHG or antibacterial soaps .  If your skin becomes reddened/irritated stop  using the CHG and inform your nurse when you arrive at Short Stay. .  You may shave your face/neck . Please follow these instructions carefully:  1.  Shower with CHG Soap the night before surgery and the  morning of Surgery.  2.  If you choose to wash your hair, wash your hair first as usual with your  normal  shampoo.  3.  After you shampoo, rinse your hair and body thoroughly to remove the  shampoo.                                        4.  Use CHG as you would any other liquid soap.  You can apply chg directly  to the skin and wash                       Gently with a scrungie or clean washcloth.  5.  Apply the CHG Soap to your body ONLY FROM THE NECK DOWN.   Do not use on face/ open                           Wound or open sores. Avoid contact with eyes, ears mouth and genitals (private parts).                       Wash face,  Genitals (private parts) with your normal soap.             6.  Wash thoroughly, paying special attention to the area where your surgery  will be performed.  7.  Thoroughly rinse your body with warm water from the neck down.  8.  DO NOT shower/wash with your normal soap after using and rinsing off  the CHG Soap.              9.  Pat yourself dry with a clean towel.            10.  Wear clean pajamas.            11.  Place clean sheets on your bed the night of your first shower and do not  sleep with pets. Day of Surgery : Do not apply any lotions/deodorants the morning of surgery.  Please wear clean clothes to the hospital/surgery center.  FAILURE TO FOLLOW THESE INSTRUCTIONS MAY RESULT IN THE CANCELLATION OF YOUR SURGERY PATIENT SIGNATURE_________________________________  NURSE SIGNATURE__________________________________  ________________________________________________________________________   Andrew Moran  An incentive spirometer is a tool that can help keep your lungs clear and active. This tool measures how well you are filling your lungs with each breath. Taking long deep breaths may help reverse or decrease the chance of developing breathing (pulmonary) problems (especially infection) following:  A long period of time when you are unable to move or be active. BEFORE THE PROCEDURE   If the spirometer includes an indicator to show your best effort, your nurse or respiratory therapist will set it to a desired goal.  If possible, sit up straight or lean slightly forward. Try not to slouch.  Hold the incentive spirometer in an upright position. INSTRUCTIONS FOR USE  1. Sit on the edge of your bed if possible, or sit up as far as you can in bed or on a chair. 2. Hold the incentive spirometer in an upright position. 3. Breathe  out normally. 4. Place the mouthpiece in your mouth and seal your lips tightly around it. 5. Breathe in slowly and as deeply as possible, raising the piston or the ball toward the top of the column. 6. Hold your breath for 3-5 seconds or for as long as possible. Allow the piston or ball to fall to the bottom of the column. 7. Remove the mouthpiece from your mouth and breathe out normally. 8. Rest for a few seconds and repeat Steps 1 through 7 at least 10 times every 1-2  hours when you are awake. Take your time and take a few normal breaths between deep breaths. 9. The spirometer may include an indicator to show your best effort. Use the indicator as a goal to work toward during each repetition. 10. After each set of 10 deep breaths, practice coughing to be sure your lungs are clear. If you have an incision (the cut made at the time of surgery), support your incision when coughing by placing a pillow or rolled up towels firmly against it. Once you are able to get out of bed, walk around indoors and cough well. You may stop using the incentive spirometer when instructed by your caregiver.  RISKS AND COMPLICATIONS  Take your time so you do not get dizzy or light-headed.  If you are in pain, you may need to take or ask for pain medication before doing incentive spirometry. It is harder to take a deep breath if you are having pain. AFTER USE  Rest and breathe slowly and easily.  It can be helpful to keep track of a log of your progress. Your caregiver can provide you with a simple table to help with this. If you are using the spirometer at home, follow these instructions: [place] IF:   You are having difficultly using the spirometer.  You have trouble using the spirometer as often as instructed.  Your pain medication is not giving enough relief while using the spirometer.  You develop fever of 100.5 F (38.1 C) or higher. SEEK IMMEDIATE MEDICAL CARE IF:   You cough up bloody sputum that had not been present before.  You develop fever of 102 F (38.9 C) or greater.  You develop worsening pain at or near the incision site. MAKE SURE YOU:   Understand these instructions.  Will watch your condition.  Will get help right away if you are not doing well or get worse. Document Released: 07/14/2006 Document Revised: 05/26/2011 Document Reviewed: 09/14/2006 [facility] Patient Information 2014 [place],  [place].   ________________________________________________________________________

## 2019-04-07 ENCOUNTER — Encounter (HOSPITAL_COMMUNITY): Payer: Self-pay

## 2019-04-07 ENCOUNTER — Encounter (HOSPITAL_COMMUNITY)
Admission: RE | Admit: 2019-04-07 | Discharge: 2019-04-07 | Disposition: A | Discharge status: Home / Self Care | Payer: Medicare Other | Source: Ambulatory Visit | Attending: Specialist | Admitting: Specialist

## 2019-04-07 ENCOUNTER — Other Ambulatory Visit: Payer: Self-pay

## 2019-04-07 DIAGNOSIS — Z01812 Encounter for preprocedural laboratory examination: Secondary | ICD-10-CM | POA: Diagnosis not present

## 2019-04-07 HISTORY — DX: Unspecified osteoarthritis, unspecified site: M19.90

## 2019-04-07 HISTORY — DX: Personal history of urinary calculi: Z87.442

## 2019-04-07 LAB — COMPREHENSIVE METABOLIC PANEL
ALT: 24 U/L (ref 0–44)
AST: 20 U/L (ref 15–41)
Albumin: 4.3 g/dL (ref 3.5–5.0)
Alkaline Phosphatase: 66 U/L (ref 38–126)
Anion gap: 9 (ref 5–15)
BUN: 15 mg/dL (ref 8–23)
CO2: 24 mmol/L (ref 22–32)
Calcium: 9.4 mg/dL (ref 8.9–10.3)
Chloride: 108 mmol/L (ref 98–111)
Creatinine, Ser: 0.92 mg/dL (ref 0.61–1.24)
GFR calc Af Amer: >60 mL/min (ref >60)
GFR calc non Af Amer: >60 mL/min (ref >60)
Glucose, Bld: 101 mg/dL — ABNORMAL HIGH (ref 70–99)
Potassium: 4 mmol/L (ref 3.5–5.1)
Sodium: 141 mmol/L (ref 135–145)
Total Bilirubin: 0.9 mg/dL (ref 0.3–1.2)
Total Protein: 6.9 g/dL (ref 6.5–8.1)

## 2019-04-07 LAB — CBC
HCT: 42.9 % (ref 39.0–52.0)
Hemoglobin: 13.7 g/dL (ref 13.0–17.0)
MCH: 29.7 pg (ref 26.0–34.0)
MCHC: 31.9 g/dL (ref 30.0–36.0)
MCV: 92.9 fL (ref 80.0–100.0)
Platelets: 266 10*3/uL (ref 150–400)
RBC: 4.62 MIL/uL (ref 4.22–5.81)
RDW: 12.8 % (ref 11.5–15.5)
WBC: 6.3 10*3/uL (ref 4.0–10.5)
nRBC: 0 % (ref 0.0–0.2)

## 2019-04-07 LAB — APTT: aPTT: 31 seconds (ref 24–36)

## 2019-04-07 LAB — PROTIME-INR
INR: 1 (ref 0.8–1.2)
Prothrombin Time: 13.5 seconds (ref 11.4–15.2)

## 2019-04-07 LAB — SURGICAL PCR SCREEN
MRSA, PCR: NEGATIVE
Staphylococcus aureus: NEGATIVE

## 2019-04-07 LAB — ABO/RH: ABO/RH(D): O POS

## 2019-04-07 MED ORDER — BUPIVACAINE LIPOSOME 1.3 % IJ SUSP
20.0000 mL | Freq: Once | INTRAMUSCULAR | Status: DC
  Filled 2019-04-07: qty 20

## 2019-04-07 NOTE — Anesthesia Preprocedure Evaluation (Addendum)
Anesthesia Evaluation  Patient identified by MRN, date of birth, ID band Patient awake    Reviewed: Allergy & Precautions, H&P , NPO status , Patient's Chart, lab work & pertinent test results  Airway Mallampati: II  TM Distance: >3 FB Neck ROM: Full    Dental no notable dental hx. (+) Teeth Intact, Dental Advisory Given   Pulmonary neg pulmonary ROS, asthma ,    Pulmonary exam normal breath sounds clear to auscultation       Cardiovascular + DOE   Rhythm:Regular Rate:Normal     Neuro/Psych negative neurological ROS  negative psych ROS   GI/Hepatic negative GI ROS, Neg liver ROS, GERD  Medicated,  Endo/Other  negative endocrine ROS  Renal/GU Renal diseasenegative Renal ROS     Musculoskeletal  (+) Arthritis ,   Abdominal   Peds  Hematology negative hematology ROS (+)   Anesthesia Other Findings   Reproductive/Obstetrics                             Anesthesia Physical  Anesthesia Plan  ASA: II  Anesthesia Plan: Spinal   Post-op Pain Management:  Regional for Post-op pain   Induction: Intravenous  PONV Risk Score and Plan: Treatment may vary due to age or medical condition and Ondansetron  Airway Management Planned: Natural Airway  Additional Equipment:   Intra-op Plan:   Post-operative Plan:   Informed Consent: I have reviewed the patients History and Physical, chart, labs and discussed the procedure including the risks, benefits and alternatives for the proposed anesthesia with the patient or authorized representative who has indicated his/her understanding and acceptance.     Dental advisory given  Plan Discussed with: CRNA  Anesthesia Plan Comments:        Anesthesia Quick Evaluation

## 2019-04-07 NOTE — Progress Notes (Signed)
PCP - Dr. Leia Alf Cardiologist - none  Chest x-ray - 11/08/18 EKG - no Stress Test - no ECHO - no Cardiac Cath - no  Sleep Study - NA CPAP -   Fasting Blood Sugar - NA Checks Blood Sugar _____ times a day  Blood Thinner Instructions:NA Aspirin Instructions: Last Dose:  Anesthesia review:   Patient denies shortness of breath, fever, cough and chest pain at PAT appointment yes  Patient verbalized understanding of instructions that were given to them at the PAT appointment. Patient was also instructed that they will need to review over the PAT instructions again at home before surgery. yes

## 2019-04-08 ENCOUNTER — Ambulatory Visit (HOSPITAL_COMMUNITY): Payer: Medicare Other | Admitting: Physician Assistant

## 2019-04-08 ENCOUNTER — Other Ambulatory Visit: Payer: Self-pay

## 2019-04-08 ENCOUNTER — Encounter (HOSPITAL_COMMUNITY)
Admission: RE | Disposition: A | Discharge status: Home / Self Care | Payer: Self-pay | Source: Ambulatory Visit | Attending: Specialist

## 2019-04-08 ENCOUNTER — Ambulatory Visit (HOSPITAL_COMMUNITY)
Admission: RE | Admit: 2019-04-08 | Discharge: 2019-04-08 | Disposition: A | Discharge status: Home / Self Care | Payer: Medicare Other | Source: Ambulatory Visit | Attending: Specialist | Admitting: Specialist

## 2019-04-08 ENCOUNTER — Encounter (HOSPITAL_COMMUNITY): Payer: Self-pay | Admitting: Specialist

## 2019-04-08 DIAGNOSIS — M1712 Unilateral primary osteoarthritis, left knee: Secondary | ICD-10-CM | POA: Diagnosis not present

## 2019-04-08 DIAGNOSIS — K219 Gastro-esophageal reflux disease without esophagitis: Secondary | ICD-10-CM | POA: Diagnosis not present

## 2019-04-08 DIAGNOSIS — Z79899 Other long term (current) drug therapy: Secondary | ICD-10-CM | POA: Diagnosis not present

## 2019-04-08 DIAGNOSIS — R0600 Dyspnea, unspecified: Secondary | ICD-10-CM | POA: Insufficient documentation

## 2019-04-08 DIAGNOSIS — Z881 Allergy status to other antibiotic agents status: Secondary | ICD-10-CM | POA: Diagnosis not present

## 2019-04-08 DIAGNOSIS — Z87442 Personal history of urinary calculi: Secondary | ICD-10-CM | POA: Diagnosis not present

## 2019-04-08 DIAGNOSIS — Z7951 Long term (current) use of inhaled steroids: Secondary | ICD-10-CM | POA: Diagnosis not present

## 2019-04-08 DIAGNOSIS — Z8249 Family history of ischemic heart disease and other diseases of the circulatory system: Secondary | ICD-10-CM | POA: Insufficient documentation

## 2019-04-08 DIAGNOSIS — J45909 Unspecified asthma, uncomplicated: Secondary | ICD-10-CM | POA: Insufficient documentation

## 2019-04-08 DIAGNOSIS — Z96659 Presence of unspecified artificial knee joint: Secondary | ICD-10-CM

## 2019-04-08 DIAGNOSIS — Z96652 Presence of left artificial knee joint: Secondary | ICD-10-CM

## 2019-04-08 HISTORY — PX: TOTAL KNEE ARTHROPLASTY: SHX125

## 2019-04-08 LAB — TYPE AND SCREEN
ABO/RH(D): O POS
Antibody Screen: NEGATIVE

## 2019-04-08 SURGERY — ARTHROPLASTY, KNEE, TOTAL
Anesthesia: Spinal | Site: Knee | Laterality: Left

## 2019-04-08 MED ORDER — MIDAZOLAM HCL 2 MG/2ML IJ SOLN
INTRAMUSCULAR | Status: AC
  Filled 2019-04-08: qty 2

## 2019-04-08 MED ORDER — PROPOFOL 500 MG/50ML IV EMUL
INTRAVENOUS | Status: DC | PRN
  Administered 2019-04-08: 50 ug/kg/min via INTRAVENOUS

## 2019-04-08 MED ORDER — SODIUM CHLORIDE 0.9 % IR SOLN
Status: DC | PRN
  Administered 2019-04-08: 1000 mL

## 2019-04-08 MED ORDER — FENTANYL CITRATE (PF) 100 MCG/2ML IJ SOLN
INTRAMUSCULAR | Status: AC
  Filled 2019-04-08: qty 2

## 2019-04-08 MED ORDER — CEFAZOLIN SODIUM-DEXTROSE 2-4 GM/100ML-% IV SOLN
2.0000 g | INTRAVENOUS | Status: AC
  Administered 2019-04-08: 2 g via INTRAVENOUS
  Filled 2019-04-08: qty 100

## 2019-04-08 MED ORDER — DEXAMETHASONE SODIUM PHOSPHATE 4 MG/ML IJ SOLN
INTRAMUSCULAR | Status: DC | PRN
  Administered 2019-04-08: 10 mg

## 2019-04-08 MED ORDER — BUPIVACAINE HCL (PF) 0.5 % IJ SOLN
INTRAMUSCULAR | Status: DC | PRN
  Administered 2019-04-08: 30 mL via PERINEURAL

## 2019-04-08 MED ORDER — SODIUM CHLORIDE (PF) 0.9 % IJ SOLN
INTRAMUSCULAR | Status: AC
  Filled 2019-04-08: qty 10

## 2019-04-08 MED ORDER — MEPERIDINE HCL 50 MG/ML IJ SOLN: 6.2500 mg | INTRAMUSCULAR | Status: DC | PRN

## 2019-04-08 MED ORDER — KETOROLAC TROMETHAMINE 30 MG/ML IJ SOLN
15.0000 mg | Freq: Once | INTRAMUSCULAR | Status: AC | PRN
  Administered 2019-04-08: 15 mg via INTRAVENOUS

## 2019-04-08 MED ORDER — DEXAMETHASONE SODIUM PHOSPHATE 10 MG/ML IJ SOLN: 8.0000 mg | Freq: Once | INTRAMUSCULAR | Status: DC

## 2019-04-08 MED ORDER — SODIUM CHLORIDE (PF) 0.9 % IJ SOLN
INTRAMUSCULAR | Status: AC
  Filled 2019-04-08: qty 50

## 2019-04-08 MED ORDER — ONDANSETRON HCL 4 MG/2ML IJ SOLN
INTRAMUSCULAR | Status: AC
  Filled 2019-04-08: qty 2

## 2019-04-08 MED ORDER — PROPOFOL 10 MG/ML IV BOLUS
INTRAVENOUS | Status: DC | PRN
  Administered 2019-04-08 (×3): 20 mg via INTRAVENOUS

## 2019-04-08 MED ORDER — TRANEXAMIC ACID-NACL 1000-0.7 MG/100ML-% IV SOLN
1000.0000 mg | INTRAVENOUS | Status: AC
  Administered 2019-04-08: 1000 mg via INTRAVENOUS
  Filled 2019-04-08: qty 100

## 2019-04-08 MED ORDER — OXYCODONE HCL 5 MG PO TABS
5.0000 mg | ORAL_TABLET | Freq: Once | ORAL | Status: AC | PRN
  Administered 2019-04-08: 5 mg via ORAL

## 2019-04-08 MED ORDER — ONDANSETRON HCL 4 MG/2ML IJ SOLN: 4.0000 mg | Freq: Once | INTRAMUSCULAR | Status: DC | PRN

## 2019-04-08 MED ORDER — 0.9 % SODIUM CHLORIDE (POUR BTL) OPTIME
TOPICAL | Status: DC | PRN
  Administered 2019-04-08: 1000 mL

## 2019-04-08 MED ORDER — POVIDONE-IODINE 10 % EX SWAB
2.0000 "application " | Freq: Once | CUTANEOUS | Status: AC
  Administered 2019-04-08: 2 via TOPICAL

## 2019-04-08 MED ORDER — CLONIDINE HCL (ANALGESIA) 100 MCG/ML EP SOLN
EPIDURAL | Status: DC | PRN
  Administered 2019-04-08: 100 ug

## 2019-04-08 MED ORDER — SODIUM CHLORIDE (PF) 0.9 % IJ SOLN
INTRAMUSCULAR | Status: DC | PRN
  Administered 2019-04-08: 60 mL

## 2019-04-08 MED ORDER — KETOROLAC TROMETHAMINE 15 MG/ML IJ SOLN
INTRAMUSCULAR | Status: AC
  Filled 2019-04-08: qty 1

## 2019-04-08 MED ORDER — BUPIVACAINE LIPOSOME 1.3 % IJ SUSP
INTRAMUSCULAR | Status: DC | PRN
  Administered 2019-04-08: 20 mL

## 2019-04-08 MED ORDER — PHENYLEPHRINE HCL-NACL 10-0.9 MG/250ML-% IV SOLN
INTRAVENOUS | Status: DC | PRN
  Administered 2019-04-08: 20 ug/min via INTRAVENOUS

## 2019-04-08 MED ORDER — STERILE WATER FOR IRRIGATION IR SOLN
Status: DC | PRN
  Administered 2019-04-08: 2000 mL

## 2019-04-08 MED ORDER — OXYCODONE HCL 5 MG PO TABS
ORAL_TABLET | ORAL | Status: AC
  Filled 2019-04-08: qty 1

## 2019-04-08 MED ORDER — METHOCARBAMOL 500 MG PO TABS: 500.0000 mg | ORAL_TABLET | Freq: Four times a day (QID) | ORAL | 0 refills | Status: DC

## 2019-04-08 MED ORDER — CEPHALEXIN 500 MG PO CAPS: 500.0000 mg | ORAL_CAPSULE | Freq: Four times a day (QID) | ORAL | 0 refills | Status: AC

## 2019-04-08 MED ORDER — OXYCODONE HCL 5 MG/5ML PO SOLN: 5.0000 mg | Freq: Once | ORAL | Status: AC | PRN

## 2019-04-08 MED ORDER — MIDAZOLAM HCL 5 MG/5ML IJ SOLN
INTRAMUSCULAR | Status: DC | PRN
  Administered 2019-04-08 (×2): 1 mg via INTRAVENOUS

## 2019-04-08 MED ORDER — ONDANSETRON HCL 4 MG/2ML IJ SOLN
INTRAMUSCULAR | Status: DC | PRN
  Administered 2019-04-08: 4 mg via INTRAVENOUS

## 2019-04-08 MED ORDER — CHLORHEXIDINE GLUCONATE 4 % EX LIQD: 60.0000 mL | Freq: Once | CUTANEOUS | Status: DC

## 2019-04-08 MED ORDER — PROPOFOL 10 MG/ML IV BOLUS
INTRAVENOUS | Status: AC
  Filled 2019-04-08: qty 40

## 2019-04-08 MED ORDER — FENTANYL CITRATE (PF) 100 MCG/2ML IJ SOLN
INTRAMUSCULAR | Status: DC | PRN
  Administered 2019-04-08 (×2): 25 ug via INTRAVENOUS
  Administered 2019-04-08: 50 ug via INTRAVENOUS
  Administered 2019-04-08 (×2): 25 ug via INTRAVENOUS
  Administered 2019-04-08: 50 ug via INTRAVENOUS

## 2019-04-08 MED ORDER — ONDANSETRON HCL 4 MG PO TABS: 4.0000 mg | ORAL_TABLET | Freq: Three times a day (TID) | ORAL | 1 refills | Status: DC | PRN

## 2019-04-08 MED ORDER — DEXAMETHASONE SODIUM PHOSPHATE 10 MG/ML IJ SOLN
INTRAMUSCULAR | Status: AC
  Filled 2019-04-08: qty 1

## 2019-04-08 MED ORDER — PHENYLEPHRINE 40 MCG/ML (10ML) SYRINGE FOR IV PUSH (FOR BLOOD PRESSURE SUPPORT)
PREFILLED_SYRINGE | INTRAVENOUS | Status: DC | PRN
  Administered 2019-04-08 (×2): 80 ug via INTRAVENOUS

## 2019-04-08 MED ORDER — HYDROMORPHONE HCL 1 MG/ML IJ SOLN: 0.2500 mg | INTRAMUSCULAR | Status: DC | PRN

## 2019-04-08 MED ORDER — LACTATED RINGERS IV SOLN: INTRAVENOUS | Status: DC

## 2019-04-08 MED ORDER — OXYCODONE HCL 5 MG PO TABS: ORAL_TABLET | ORAL | 0 refills | Status: DC

## 2019-04-08 MED ORDER — BUPIVACAINE IN DEXTROSE 0.75-8.25 % IT SOLN
INTRATHECAL | Status: DC | PRN
  Administered 2019-04-08: 1.6 mL via INTRATHECAL

## 2019-04-08 SURGICAL SUPPLY — 66 items
ATTUNE PS FEM LT SZ 6 CEM KNEE (Femur) ×2 IMPLANT
ATTUNE PSRP INSR SZ6 6 KNEE (Insert) ×2 IMPLANT
BAG DECANTER FOR FLEXI CONT (MISCELLANEOUS) IMPLANT
BAG ZIPLOCK 12X15 (MISCELLANEOUS) ×4 IMPLANT
BASE TIBIAL ROT PLAT SZ 7 KNEE (Knees) ×1 IMPLANT
BLADE SAG 18X100X1.27 (BLADE) ×2 IMPLANT
BLADE SAW SGTL 11.0X1.19X90.0M (BLADE) ×2 IMPLANT
BNDG ELASTIC 4X5.8 VLCR STR LF (GAUZE/BANDAGES/DRESSINGS) ×2 IMPLANT
BNDG ELASTIC 6X5.8 VLCR STR LF (GAUZE/BANDAGES/DRESSINGS) ×2 IMPLANT
BOWL SMART MIX CTS (DISPOSABLE) ×2 IMPLANT
CEMENT HV SMART SET (Cement) ×4 IMPLANT
CHLORAPREP W/TINT 26 (MISCELLANEOUS) ×2 IMPLANT
COVER SURGICAL LIGHT HANDLE (MISCELLANEOUS) ×2 IMPLANT
COVER WAND RF STERILE (DRAPES) IMPLANT
CUFF TOURN SGL QUICK 34 (TOURNIQUET CUFF) ×1
CUFF TRNQT CYL 34X4.125X (TOURNIQUET CUFF) ×1 IMPLANT
DECANTER SPIKE VIAL GLASS SM (MISCELLANEOUS) ×4 IMPLANT
DERMABOND ADVANCED (GAUZE/BANDAGES/DRESSINGS) ×1
DERMABOND ADVANCED .7 DNX12 (GAUZE/BANDAGES/DRESSINGS) ×1 IMPLANT
DRAPE U-SHAPE 47X51 STRL (DRAPES) ×2 IMPLANT
DRSG AQUACEL AG ADV 3.5X10 (GAUZE/BANDAGES/DRESSINGS) ×2 IMPLANT
DRSG TEGADERM 4X4.75 (GAUZE/BANDAGES/DRESSINGS) ×2 IMPLANT
ELECT REM PT RETURN 15FT ADLT (MISCELLANEOUS) ×2 IMPLANT
EVACUATOR 1/8 PVC DRAIN (DRAIN) ×4 IMPLANT
GAUZE SPONGE 2X2 8PLY STRL LF (GAUZE/BANDAGES/DRESSINGS) ×1 IMPLANT
GLOVE BIOGEL PI IND STRL 7.5 (GLOVE) ×1 IMPLANT
GLOVE BIOGEL PI IND STRL 8 (GLOVE) ×1 IMPLANT
GLOVE BIOGEL PI INDICATOR 7.5 (GLOVE) ×1
GLOVE BIOGEL PI INDICATOR 8 (GLOVE) ×1
GLOVE ECLIPSE 8.0 STRL XLNG CF (GLOVE) ×2 IMPLANT
GLOVE SURG ORTHO 9.0 STRL STRW (GLOVE) ×2 IMPLANT
GLOVE SURG SS PI 7.0 STRL IVOR (GLOVE) ×2 IMPLANT
GOWN STRL REUS W/TWL XL LVL3 (GOWN DISPOSABLE) ×4 IMPLANT
HANDPIECE INTERPULSE COAX TIP (DISPOSABLE) ×1
HOLDER FOLEY CATH W/STRAP (MISCELLANEOUS) IMPLANT
JET LAVAGE IRRISEPT WOUND (IRRIGATION / IRRIGATOR) ×2
KIT TURNOVER KIT A (KITS) IMPLANT
LAVAGE JET IRRISEPT WOUND (IRRIGATION / IRRIGATOR) ×1 IMPLANT
NS IRRIG 1000ML POUR BTL (IV SOLUTION) ×2 IMPLANT
PACK TOTAL KNEE CUSTOM (KITS) ×2 IMPLANT
PATELLA MEDIAL ATTUN 35MM KNEE (Knees) ×2 IMPLANT
PENCIL SMOKE EVACUATOR (MISCELLANEOUS) ×2 IMPLANT
PIN DRILL FIX HALF THREAD (BIT) ×2 IMPLANT
PIN STEINMAN FIXATION KNEE (PIN) ×2 IMPLANT
PROTECTOR NERVE ULNAR (MISCELLANEOUS) ×2 IMPLANT
SET HNDPC FAN SPRY TIP SCT (DISPOSABLE) ×1 IMPLANT
SET PAD KNEE POSITIONER (MISCELLANEOUS) ×2 IMPLANT
SPONGE GAUZE 2X2 STER 10/PKG (GAUZE/BANDAGES/DRESSINGS) ×1
SPONGE LAP 18X18 RF (DISPOSABLE) IMPLANT
SPONGE SURGIFOAM ABS GEL 100 (HEMOSTASIS) ×2 IMPLANT
STOCKINETTE 6  STRL (DRAPES) ×1
STOCKINETTE 6 STRL (DRAPES) ×1 IMPLANT
SUT BONE WAX W31G (SUTURE) IMPLANT
SUT MNCRL AB 3-0 PS2 18 (SUTURE) ×2 IMPLANT
SUT VIC AB 1 CT1 27 (SUTURE) ×3
SUT VIC AB 1 CT1 27XBRD ANTBC (SUTURE) ×3 IMPLANT
SUT VIC AB 2-0 CT1 27 (SUTURE) ×2
SUT VIC AB 2-0 CT1 TAPERPNT 27 (SUTURE) ×2 IMPLANT
SUT VLOC 180 0 24IN GS25 (SUTURE) ×2 IMPLANT
SYR 50ML LL SCALE MARK (SYRINGE) ×2 IMPLANT
TAPE STRIPS DRAPE STRL (GAUZE/BANDAGES/DRESSINGS) ×2 IMPLANT
TIBIAL BASE ROT PLAT SZ 7 KNEE (Knees) ×2 IMPLANT
TRAY FOLEY MTR SLVR 16FR STAT (SET/KITS/TRAYS/PACK) ×2 IMPLANT
WATER STERILE IRR 1000ML POUR (IV SOLUTION) ×4 IMPLANT
WRAP KNEE MAXI GEL POST OP (GAUZE/BANDAGES/DRESSINGS) ×2 IMPLANT
YANKAUER SUCT BULB TIP 10FT TU (MISCELLANEOUS) ×2 IMPLANT

## 2019-04-08 NOTE — Interval H&P Note (Signed)
History and Physical Interval Note:  04/08/2019 7:33 AM  Andrew Moran  has presented today for surgery, with the diagnosis of Left knee osteoarthritis.  The various methods of treatment have been discussed with the patient and family. After consideration of risks, benefits and other options for treatment, the patient has consented to  Procedure(s) with comments: TOTAL KNEE ARTHROPLASTY (Left) - with adductor canal as a surgical intervention.  The patient's history has been reviewed, patient examined, no change in status, stable for surgery.  I have reviewed the patient's chart and labs.  Questions were answered to the patient's satisfaction.     Fredick Schlosser ANDREW

## 2019-04-08 NOTE — Evaluation (Signed)
Physical Therapy Evaluation Patient Details Name: Andrew Moran MRN: [id number] DOB: 01/13/1946 Today's Date: 04/08/2019   History of Present Illness  s/p L TKA  Clinical Impression  Patient evaluated by Physical Therapy with no further acute PT needs identified. All education has been completed and the patient has no further questions. See below for any follow-up Physical Therapy or equipment needs. PT is signing off. Thank you for this referral. Pt seen for mobility noted below and review of HEP. Gait distance limited d/t PA needing to pull out hemovac. Pt doing well; decr gait velocity, no LOB, recommend supervision for mobility at home. Ready to d/c from PT standpoint     Follow Up Recommendations Follow surgeon's recommendation for DC plan and follow-up therapies;Supervision for mobility/OOB    Equipment Recommendations  None recommended by PT    Recommendations for Other Services       Precautions / Restrictions Precautions Precautions: Knee;Fall Restrictions Weight Bearing Restrictions: No      Mobility  Bed Mobility Overal bed mobility: Needs Assistance Bed Mobility: Supine to Sit;Sit to Supine     Supine to sit: Min assist Sit to supine: Min assist   General bed mobility comments: assist with LLE, instructed in use of gait belt as leg lifter  Transfers Overall transfer level: Needs assistance Equipment used: Rolling walker (2 wheeled) Transfers: Sit to/from Stand Sit to Stand: Min assist;Min guard         General transfer comment: cues for hand placement  Ambulation/Gait Ambulation/Gait assistance: Min assist Gait Distance (Feet): 60 Feet Assistive device: Rolling walker (2 wheeled) Gait Pattern/deviations: Step-to pattern;Decreased stance time - left;Decreased weight shift to left;Trunk flexed;Decreased step length - right;Decreased step length - left     General Gait Details: cues for sequence and RW distance from self  Stairs             Wheelchair Mobility    Modified Rankin (Stroke Patients Only)       Balance                                             Pertinent Vitals/Pain Pain Assessment: 0-10 Pain Score: 3  Pain Location: L knee Pain Descriptors / Indicators: Grimacing;Aching Pain Intervention(s): Limited activity within patient's tolerance;Monitored during session    Home Living Family/patient expects to be discharged to:: Private residence Living Arrangements: Spouse/significant other Available Help at Discharge: Family Type of Home: House Home Access: Level entry     Home Layout: One level Home Equipment: Environmental consultant - 2 wheels      Prior Function Level of Independence: Independent               Hand Dominance        Extremity/Trunk Assessment   Upper Extremity Assessment Upper Extremity Assessment: Overall WFL for tasks assessed    Lower Extremity Assessment Lower Extremity Assessment: LLE deficits/detail LLE Deficits / Details: ankle WFL, knee extension and hip flexion 2+/5       Communication      Cognition Arousal/Alertness: Awake/alert Behavior During Therapy: WFL for tasks assessed/performed Overall Cognitive Status: Within Functional Limits for tasks assessed                                        General Comments  Exercises Total Joint Exercises Ankle Circles/Pumps: AROM;Both;10 reps Quad Sets: AROM;Both;10 reps Heel Slides: AAROM;Left;5 reps Hip ABduction/ADduction: AAROM;Left;5 reps Straight Leg Raises: AAROM;Left;5 reps Goniometric ROM: grossly 8 to 55 degrees knee flexion L, AAROM   Assessment/Plan    PT Assessment All further PT needs can be met in the next venue of care  PT Problem List         PT Treatment Interventions      PT Goals (Current goals can be found in the Care Plan section)  Acute Rehab PT Goals PT Goal Formulation: All assessment and education complete, DC therapy    Frequency      Barriers to discharge        Co-evaluation               AM-PAC PT "6 Clicks" Mobility  Outcome Measure Help needed turning from your back to your side while in a flat bed without using bedrails?: A Little Help needed moving from lying on your back to sitting on the side of a flat bed without using bedrails?: A Little Help needed moving to and from a bed to a chair (including a wheelchair)?: A Little Help needed standing up from a chair using your arms (e.g., wheelchair or bedside chair)?: A Little Help needed to walk in hospital room?: A Little Help needed climbing 3-5 steps with a railing? : A Little 6 Click Score: 18    End of Session Equipment Utilized During Treatment: Gait belt Activity Tolerance: Patient tolerated treatment well Patient left: in bed;with call bell/phone within reach Nurse Communication: Mobility status PT Visit Diagnosis: Difficulty in walking, not elsewhere classified (R26.2)    Time: 1856-3149 PT Time Calculation (min) (ACUTE ONLY): 30 min   Charges:   PT Evaluation $PT Eval Low Complexity: 1 Low PT Treatments $Gait Training: 8-22 mins        Baxter Flattery, PT   Acute Rehab Dept [facility]): [phone]   04/08/2019   [facility] 04/08/2019, 2:39 PM

## 2019-04-08 NOTE — Transfer of Care (Signed)
Immediate Anesthesia Transfer of Care Note  Patient: Andrew Moran  Procedure(s) Performed: TOTAL KNEE ARTHROPLASTY (Left Knee)  Patient Location: PACU  Anesthesia Type:Spinal and MAC combined with regional for post-op pain  Level of Consciousness: awake, alert , oriented and patient cooperative  Airway & Oxygen Therapy: Patient Spontanous Breathing and Patient connected to face mask oxygen  Post-op Assessment: Report given to RN and Post -op Vital signs reviewed and stable  Post vital signs: Reviewed and stable  Last Vitals:  Vitals Value Taken Time  BP 117/81 04/08/19 1006  Temp    Pulse 85 04/08/19 1008  Resp 23 04/08/19 1008  SpO2 98 % 04/08/19 1008  Vitals shown include unvalidated device data.  Last Pain:  Vitals:   04/08/19 0620  TempSrc:   PainSc: 0-No pain         Complications: No apparent anesthesia complications

## 2019-04-08 NOTE — Anesthesia Procedure Notes (Addendum)
Spinal  Patient location during procedure: OR Staffing Performed: anesthesiologist  Anesthesiologist: Nolon Nations, MD Resident/CRNA: West Pugh, CRNA Preanesthetic Checklist Completed: patient identified, IV checked, site marked, risks and benefits discussed, surgical consent, monitors and equipment checked, pre-op evaluation and timeout performed Spinal Block Patient position: sitting Prep: ChloraPrep Patient monitoring: heart rate, cardiac monitor, continuous pulse ox and blood pressure Approach: midline Location: L2-3 Injection technique: single-shot Needle Needle type: Sprotte  Needle gauge: 24 G Needle length: 9 cm Assessment Sensory level: T4

## 2019-04-08 NOTE — Anesthesia Procedure Notes (Signed)
Anesthesia Regional Block: Knee block (iPack)   Pre-Anesthetic Checklist: ,, timeout performed, Correct Patient, Correct Site, Correct Laterality, Correct Procedure, Correct Position, site marked, Risks and benefits discussed,  Surgical consent,  Pre-op evaluation,  At surgeon's request and post-op pain management  Laterality: Left  Prep: chloraprep       Needles:  Injection technique: Single-shot  Needle Type: Stimiplex     Needle Length: 9cm  Needle Gauge: 21     Additional Needles:   Procedures:,,,, ultrasound used (permanent image in chart),,,,  Narrative:  Start time: 04/08/2019 7:10 AM End time: 04/08/2019 7:15 AM Injection made incrementally with aspirations every 5 mL.  Performed by: Personally  Anesthesiologist: Nolon Nations, MD  Additional Notes: BP cuff, EKG monitors applied. Sedation begun. Artery and nerve location verified with U/S and anesthetic injected incrementally, slowly, and after negative aspirations under direct u/s guidance. Good fascial /perineural spread. Tolerated well.

## 2019-04-08 NOTE — Op Note (Signed)
DATE OF SURGERY:  04/08/2019  TIME: 9:51 AM  PATIENT NAME:  Andrew Moran    AGE: 74 y.o.   PRE-OPERATIVE DIAGNOSIS:  Left knee osteoarthritis  POST-OPERATIVE DIAGNOSIS:  Left knee osteoarthritis  PROCEDURE:  Procedure(s): TOTAL KNEE ARTHROPLASTY  SURGEON:  Korey Arroyo ANDREW  ASSISTANT:  Leeanne Haus, PA-C, present and scrubbed throughout the case, critical for assistance with exposure, retraction, instrumentation, and closure.  OPERATIVE IMPLANTS: Depuy PFC Attune Rotating Platform.  Femur size 6, Tibia size 7, Patella size 35 3-peg oval button, with a 6 mm polyethylene insert.   PREOPERATIVE INDICATIONS:   Andrew Moran is a 74 y.o. year old male with end stage bone on bone arthritis of the knee who failed conservative treatment and elected for Total Knee Arthroplasty.   The risks, benefits, and alternatives were discussed at length including but not limited to the risks of infection, bleeding, nerve injury, stiffness, blood clots, the need for revision surgery, cardiopulmonary complications, among others, and they were willing to proceed.  OPERATIVE DESCRIPTION:  The patient was brought to the operative room and placed in a supine position.  Spinal anesthesia was administered.  IV antibiotics were given.  The lower extremity was prepped and draped in the usual sterile fashion.  Time out was performed.  The leg was elevated and exsanguinated and the tourniquet was inflated.  Anterior quadriceps tendon splitting approach was performed.  The patella was retracted and osteophytes were removed.  The anterior horn of the medial and lateral meniscus was removed and cruciate ligaments resected.   The distal femur was opened with the drill and the intramedullary distal femoral cutting jig was utilized, set at 5 degrees resecting 10 mm off the distal femur.  Care was taken to protect the collateral ligaments.  The distal femoral sizing jig was applied, taking care to avoid  notching.  Then the 4-in-1 cutting jig was applied and the anterior and posterior femur was cut, along with the chamfer cuts.    Then the extramedullary tibial cutting jig was utilized making the appropriate cut using the anterior tibial crest as a reference building in appropriate posterior slope.  Care was taken during the cut to protect the medial and collateral ligaments.  The proximal tibia was removed along with the posterior horns of the menisci.   The posterior medial femoral osteophytes and posterior lateral femoral osteophytes were removed.    The flexion gap was then measured and was symmetric with the extension gap, measured at 6.  I completed the distal femoral preparation using the appropriate jig to prepare the box.  The patella was then measured, and cut with the saw.    The proximal tibia sized and prepared accordingly with the reamer and the punch, and then all components were trialed with the trial insert.  The knee was found to have excellent balance and full motion.    The above named components were then cemented into place and all excess cement was removed.  The trial polyethylene component was in place during cementation, and then was exchanged for the real polyethylene component.    The knee was easily taken through a range of motion and the patella tracked well and the knee irrigated copiously and the parapatellar and subcutaneous tissue closed with vicryl, and monocryl with steri strips for the skin.  The arthrotomy was closed at 90 of flexion. The wounds were dressed with sterile gauze and the tourniquet released and the patient was awakened and returned to the PACU in  stable and satisfactory condition.  There were no complications.  Total tourniquet time was 85 minutes.

## 2019-04-08 NOTE — Anesthesia Procedure Notes (Signed)
Anesthesia Regional Block: Adductor canal block   Pre-Anesthetic Checklist: ,, timeout performed, Correct Patient, Correct Site, Correct Laterality, Correct Procedure, Correct Position, site marked, Risks and benefits discussed,  Surgical consent,  Pre-op evaluation,  At surgeon's request and post-op pain management  Laterality: Left  Prep: chloraprep       Needles:  Injection technique: Single-shot  Needle Type: Stimiplex     Needle Length: 9cm  Needle Gauge: 21     Additional Needles:   Procedures:,,,, ultrasound used (permanent image in chart),,,,  Narrative:  Start time: 04/08/2019 7:05 AM End time: 04/08/2019 7:10 AM Injection made incrementally with aspirations every 5 mL.  Performed by: Personally  Anesthesiologist: Nolon Nations, MD  Additional Notes: BP cuff, EKG monitors applied. Sedation begun. Artery and nerve location verified with U/S and anesthetic injected incrementally, slowly, and after negative aspirations under direct u/s guidance. Good fascial /perineural spread. Tolerated well.

## 2019-04-08 NOTE — Anesthesia Procedure Notes (Addendum)
Procedure Name: MAC Date/Time: 04/08/2019 7:39 AM Performed by: West Pugh, CRNA Pre-anesthesia Checklist: Patient identified, Emergency Drugs available, Suction available, Patient being monitored and Timeout performed Patient Re-evaluated:Patient Re-evaluated prior to induction Oxygen Delivery Method: Simple face mask Preoxygenation: Pre-oxygenation with 100% oxygen Induction Type: IV induction Placement Confirmation: positive ETCO2 and breath sounds checked- equal and bilateral Dental Injury: Teeth and Oropharynx as per pre-operative assessment

## 2019-04-11 ENCOUNTER — Encounter: Payer: Self-pay | Admitting: *Deleted

## 2019-04-11 NOTE — Anesthesia Postprocedure Evaluation (Signed)
Anesthesia Post Note  Patient: Andrew Moran  Procedure(s) Performed: TOTAL KNEE ARTHROPLASTY (Left Knee)     Patient location during evaluation: PACU Anesthesia Type: Spinal Level of consciousness: awake and alert Pain management: pain level controlled Vital Signs Assessment: post-procedure vital signs reviewed and stable Respiratory status: spontaneous breathing and respiratory function stable Cardiovascular status: blood pressure returned to baseline and stable Postop Assessment: spinal receding Anesthetic complications: no    Last Vitals:  Vitals:   04/08/19 1200 04/08/19 1330  BP: 120/67 115/65  Pulse: 86 86  Resp:    Temp:    SpO2: 96% 93%    Last Pain:  Vitals:   04/08/19 1330  TempSrc:   PainSc: 3                  Nolon Nations

## 2019-04-17 ENCOUNTER — Ambulatory Visit: Payer: Medicare Other

## 2019-04-23 ENCOUNTER — Ambulatory Visit: Payer: Medicare Other

## 2019-04-28 ENCOUNTER — Ambulatory Visit: Payer: Medicare Other

## 2019-06-27 ENCOUNTER — Encounter: Payer: Self-pay | Admitting: Internal Medicine

## 2019-06-27 ENCOUNTER — Other Ambulatory Visit (HOSPITAL_COMMUNITY): Payer: Self-pay | Admitting: Specialist

## 2019-06-27 ENCOUNTER — Other Ambulatory Visit: Payer: Self-pay

## 2019-06-27 ENCOUNTER — Ambulatory Visit (HOSPITAL_COMMUNITY)
Admission: RE | Admit: 2019-06-27 | Discharge: 2019-06-27 | Disposition: A | Discharge status: Home / Self Care | Payer: Medicare Other | Source: Ambulatory Visit | Attending: Internal Medicine | Admitting: Internal Medicine

## 2019-06-27 ENCOUNTER — Ambulatory Visit (INDEPENDENT_AMBULATORY_CARE_PROVIDER_SITE_OTHER): Payer: Medicare Other | Admitting: Internal Medicine

## 2019-06-27 VITALS — BP 118/70 | HR 81 | Ht 65.0 in | Wt 172.0 lb

## 2019-06-27 DIAGNOSIS — Z09 Encounter for follow-up examination after completed treatment for conditions other than malignant neoplasm: Secondary | ICD-10-CM

## 2019-06-27 DIAGNOSIS — M79605 Pain in left leg: Secondary | ICD-10-CM | POA: Diagnosis not present

## 2019-06-27 DIAGNOSIS — M7989 Other specified soft tissue disorders: Secondary | ICD-10-CM | POA: Insufficient documentation

## 2019-06-27 DIAGNOSIS — I82452 Acute embolism and thrombosis of left peroneal vein: Secondary | ICD-10-CM

## 2019-06-27 DIAGNOSIS — Z86718 Personal history of other venous thrombosis and embolism: Secondary | ICD-10-CM

## 2019-06-27 DIAGNOSIS — Z96652 Presence of left artificial knee joint: Secondary | ICD-10-CM

## 2019-06-27 HISTORY — DX: Personal history of other venous thrombosis and embolism: Z86.718

## 2019-06-27 MED ORDER — RIVAROXABAN 20 MG PO TABS: 20.0000 mg | ORAL_TABLET | Freq: Every day | ORAL | 1 refills | Status: DC

## 2019-06-27 NOTE — Progress Notes (Signed)
Cardiology Office Note:    Date:  06/27/2019   ID:  Andrew Moran, DOB [birth year], MRN [id number]  PCP:  London Pepper, MD  Cardiologist:  No primary care provider on file.  Electrophysiologist:  None   Referring MD: London Pepper, MD   Chief Complaint: LLE DVT  History of Present Illness:    Andrew Moran is a 74 y.o. male with a history of arthritis, asthma, diverticulitis, GERD, kidney stones, who presents for a LLE DVT.   Physical therapy on Thursday.  Friday pain  - excruciating pain, used cane to ambulate.  Saturday and Sunday pain. Seen by Ortho today. Noted to have pain and swelling, found to have LLE DVT on dopplers.   No prior history of clotting or bleeding. No PE history. The patient denies chest pain, chest pressure, dyspnea at rest or with exertion, palpitations, PND, orthopnea. Denies cough, fever, chills. Denies nausea, vomiting. Denies syncope or presyncope. Denies dizziness or lightheadedness.   Past Medical History:  Diagnosis Date  . Arthritis   . Asthma   . Back fracture 1988   L3 TO L 4  . Diverticulitis 2002  . Fracture, clavicle 1981   LEFT CLAVICLE  . GERD (gastroesophageal reflux disease)   . History of kidney stones   . Kidney stone 1967    Past Surgical History:  Procedure Laterality Date  . ANKLE SURGERY  1988   RIGHT FOOT WITH SCREWS  . ANKLE SURGERY  2008   SCREWS REMOVED FROM RIGHT FOOT  . BACK SURGERY  1995   LOWER BACK  . BACK SURGERY  1995   2ND BACK SURGERY LOWER BACK  . CARPAL TUNNEL RELEASE  1984   RIGHT   . CARPAL TUNNEL RELEASE  1983   LEFT HAND  . CYSTOSCOPY/RETROGRADE/URETEROSCOPY/STONE EXTRACTION WITH BASKET Bilateral 10/10/2013   Procedure: CYSTOSCOPY/URETEROSCOPY/STONE EXTRACTION WITH HOLMIUM LASER, bilateral retrograde, bilateral ureteral stents;  Surgeon: Jorja Loa, MD;  Location: [facility];  Service: Urology;  Laterality: Bilateral;  . EXPLORATORY LAPAROTOMY  1970   EXPLORATORY STOMACH SURGERY FOR  TEAR IN STOMACH WALL  . HOLMIUM LASER APPLICATION Left 0000000   Procedure: HOLMIUM LASER APPLICATION;  Surgeon: Jorja Loa, MD;  Location: [facility];  Service: Urology;  Laterality: Left;  . KNEE ARTHROSCOPY  02/05/2011   Procedure: ARTHROSCOPY KNEE;  Surgeon: Laurice Record [place];  Location: [facility];  Service: Orthopedics;  Laterality: Right;  Right knee arthroscopy partial lateral and medial meniscectomy with condyle debridement  . LITHOTRIPSY  1986  . LITHOTRIPSY  2001  . LITHOTRIPSY  2005  . LITHOTRIPSY  2008  . LOWER BACK INJECTION  2002  . LOWER BACK INJECTION  2011  . ROTATOR CUFF REPAIR  1997   RIGHT  . ROTATOR CUFF REPAIR  1997   LEFT  . ROTATOR CUFF REPAIR Right 02/16/2018  . TONSILLECTOMY  1951    AND ADENOIDS  . TOTAL KNEE ARTHROPLASTY Left 04/08/2019   Procedure: TOTAL KNEE ARTHROPLASTY;  Surgeon: Sydnee Cabal, MD;  Location: [facility];  Service: Orthopedics;  Laterality: Left;  with adductor canal    Current Medications: Current Meds  Medication Sig  . albuterol (PROVENTIL HFA;VENTOLIN HFA) 108 (90 BASE) MCG/ACT inhaler Inhale 2 puffs into the lungs every 4 (four) hours as needed for wheezing or shortness of breath. Wheezing   . APPLE CIDER VINEGAR PO Take 5 mLs by mouth daily.  Marland Kitchen atorvastatin (LIPITOR) 20 MG tablet Take 20 mg by mouth at bedtime.   . budesonide-formoterol (  SYMBICORT) 80-4.5 MCG/ACT inhaler Inhale 2 puffs into the lungs 2 (two) times daily.  . cholecalciferol (VITAMIN D3) 25 MCG (1000 UNIT) tablet Take 1,000 Units by mouth daily.  . methocarbamol (ROBAXIN) 500 MG tablet Take 1 tablet (500 mg total) by mouth 4 (four) times daily.  . Multiple Vitamins-Minerals (MULTIVITAMINS THER. W/MINERALS) TABS Take 1 tablet by mouth daily.   . ondansetron (ZOFRAN) 4 MG tablet Take 1 tablet (4 mg total) by mouth every 8 (eight) hours as needed for nausea or vomiting.  Marland Kitchen OVER THE COUNTER MEDICATION Take 2 tablets by mouth at bedtime. Deglycyrrhizinated-DGL  .  oxyCODONE (ROXICODONE) 5 MG immediate release tablet Take 1-2 tabs PO q 4-6 hrs as needed for pain     Allergies:   Zithromax [azithromycin] and Betadine [povidone iodine]   Social History   Socioeconomic History  . Marital status: Married    Spouse name: Not on file  . Number of children: Not on file  . Years of education: Not on file  . Highest education level: Not on file  Occupational History  . Not on file  Tobacco Use  . Smoking status: Never Smoker  . Smokeless tobacco: Never Used  Substance and Sexual Activity  . Alcohol use: Yes    Comment: OCCASIONAL  . Drug use: No  . Sexual activity: Yes  Other Topics Concern  . Not on file  Social History Narrative  . Not on file   Social Determinants of Health   Financial Resource Strain:   . Difficulty of Paying Living Expenses:   Food Insecurity:   . Worried About Charity fundraiser in the Last Year:   . Arboriculturist in the Last Year:   Transportation Needs:   . Film/video editor (Medical):   Marland Kitchen Lack of Transportation (Non-Medical):   Physical Activity:   . Days of Exercise per Week:   . Minutes of Exercise per Session:   Stress:   . Feeling of Stress :   Social Connections:   . Frequency of Communication with Friends and Family:   . Frequency of Social Gatherings with Friends and Family:   . Attends Religious Services:   . Active Member of Clubs or Organizations:   . Attends Archivist Meetings:   Marland Kitchen Marital Status:      Family History: The patient's family history includes Heart attack in his father.  ROS:   Please see the history of present illness.    All other systems reviewed and are negative.  EKGs/Labs/Other Studies Reviewed:    The following studies were reviewed today:  EKG:  NSR   Recent Labs: 04/07/2019: ALT 24; BUN 15; Creatinine, Ser 0.92; Hemoglobin 13.7; Platelets 266; Potassium 4.0; Sodium 141  Recent Lipid Panel No results found for: CHOL, TRIG, HDL, CHOLHDL, VLDL,  LDLCALC, LDLDIRECT  Physical Exam:    VS:  BP 118/70 (BP Location: Left Arm, Patient Position: Sitting, Cuff Size: Normal)   Pulse 81   Ht 5\' 5"  (1.651 m)   Wt 172 lb (78 kg)   BMI 28.62 kg/m     Wt Readings from Last 5 Encounters:  06/27/19 172 lb (78 kg)  04/07/19 177 lb 8 oz (80.5 kg)  03/21/19 184 lb 6.4 oz (83.6 kg)  11/08/18 180 lb (81.6 kg)  05/10/18 178 lb 6.4 oz (80.9 kg)     Constitutional: No acute distress Eyes: sclera non-icteric, normal conjunctiva and lids ENMT: mask in place Cardiovascular: regular rhythm, normal rate,  no murmurs. S1 and S2 normal. Radial pulses normal bilaterally. No jugular venous distention.  Respiratory: clear to auscultation bilaterally GI : normal bowel sounds, soft and nontender. No distention.   MSK: LLE diffuse swelling, erythema. NEURO: grossly nonfocal exam, moves all extremities. PSYCH: alert and oriented x 3, normal mood and affect.      ASSESSMENT:    1. Acute deep vein thrombosis (DVT) of left peroneal vein (HCC)   2. Pain and swelling of left lower extremity   3. History of total left knee replacement    PLAN:    Acute deep vein thrombosis (DVT) of left peroneal vein (HCC) - Plan: EKG 12-Lead, VAS [place] LOWER EXTREMITY VENOUS (DVT)  Pain and swelling of left lower extremity  History of total left knee replacement   Provoked DVT of LLE, will start Xarelto 15 mg BID for 3 weeks then 20 mg daily for total of 3 mo. Will consider repeat dopplers if patient is still symptomatic at time of return. Samples provided to patient and plan discussed in great detail.   Return in 3 mo for reevaluation.  Total time of encounter: 45 minutes total time of encounter, including 30 minutes spent in face-to-face patient care on the date of this encounter. This time includes coordination of care and counseling regarding above mentioned problem list. Remainder of non-face-to-face time involved reviewing chart documents/testing relevant to the  patient encounter and documentation in the medical record. I have independently reviewed documentation from referring provider.   Cherlynn Kaiser, MD [place]  [facility]    Medication Adjustments/Labs and Tests Ordered: Current medicines are reviewed at length with the patient today.  Concerns regarding medicines are outlined above.  Orders Placed This Encounter  Procedures  . EKG 12-Lead  . VAS [place] LOWER EXTREMITY VENOUS (DVT)   Meds ordered this encounter  Medications  . rivaroxaban (XARELTO) 20 MG TABS tablet    Sig: Take 1 tablet (20 mg total) by mouth daily with supper.    Dispense:  30 tablet    Refill:  1    Patient Instructions  Medication Instructions:  Start Xarelto 15 mg twice daily for 21 days (use samples for this)- then go to 20 mg daily (use 30 day free card for this), call us when you use the free card to see if we have samples of the 20 mg for the next month.   *If you need a refill on your cardiac medications before your next appointment, please call your pharmacy*   Testing/Procedures: Your physician has requested that you have a lower extremity venous duplex. This test is an ultrasound of the veins in the legs or arms. It looks at venous blood flow that carries blood from the heart to the legs or arms. Allow one hour for a Lower Venous exam. Allow thirty minutes for an Upper Venous exam. There are no restrictions or special instructions.   Follow-Up: At [facility], you and your health needs are our priority.  As part of our continuing mission to provide you with exceptional heart care, we have created designated Provider Care Teams.  These Care Teams include your primary Cardiologist (physician) and Advanced Practice Providers (APPs -  Physician Assistants and Nurse Practitioners) who all work together to provide you with the care you need, when you need it.  We recommend signing up for the patient portal called "MyChart".  Sign up information is  provided on this After Visit Summary.  MyChart is used to connect with  patients for Virtual Visits (Telemedicine).  Patients are able to view lab/test results, encounter notes, upcoming appointments, etc.  Non-urgent messages can be sent to your provider as well.   To learn more about what you can do with MyChart, go to [email or web address].    Your next appointment:   3 month(s)  The format for your next appointment:    In Person  Provider:   Cherlynn Kaiser, MD

## 2019-06-27 NOTE — Patient Instructions (Addendum)
Medication Instructions:  Start Xarelto 15 mg twice daily for 21 days (use samples for this)- then go to 20 mg daily (use 30 day free card for this), call us when you use the free card to see if we have samples of the 20 mg for the next month.   *If you need a refill on your cardiac medications before your next appointment, please call your pharmacy*   Testing/Procedures: Your physician has requested that you have a lower extremity venous duplex. This test is an ultrasound of the veins in the legs or arms. It looks at venous blood flow that carries blood from the heart to the legs or arms. Allow one hour for a Lower Venous exam. Allow thirty minutes for an Upper Venous exam. There are no restrictions or special instructions.   Follow-Up: At [facility], you and your health needs are our priority.  As part of our continuing mission to provide you with exceptional heart care, we have created designated Provider Care Teams.  These Care Teams include your primary Cardiologist (physician) and Advanced Practice Providers (APPs -  Physician Assistants and Nurse Practitioners) who all work together to provide you with the care you need, when you need it.  We recommend signing up for the patient portal called "MyChart".  Sign up information is provided on this After Visit Summary.  MyChart is used to connect with patients for Virtual Visits (Telemedicine).  Patients are able to view lab/test results, encounter notes, upcoming appointments, etc.  Non-urgent messages can be sent to your provider as well.   To learn more about what you can do with MyChart, go to [email or web address].    Your next appointment:   3 month(s)  The format for your next appointment:    In Person  Provider:   Cherlynn Kaiser, MD

## 2019-06-28 ENCOUNTER — Telehealth: Payer: Self-pay | Admitting: Internal Medicine

## 2019-06-28 NOTE — Telephone Encounter (Signed)
Pt c/o medication issue:  1. Name of Medication: rivaroxaban (XARELTO) 20 MG TABS tablet  2. How are you currently taking this medication (dosage and times per day)? As directed  3. Are you having a reaction (difficulty breathing--STAT)? No  4. What is your medication issue? Patient has some questions in regards to this medication. He states that he read there are certain medications you should not mix with this.

## 2019-06-28 NOTE — Telephone Encounter (Signed)
Returned call to patient of Dr. Margaretann Loveless who was started on Xarelto yesterday for DVT He recently had a total knee replacement and has been using tylenol  He is concerned about using OTC tylenol with this He is asking what would be OK to take for pain management   He takes OTC meds for GERD/indigestion - DGL (licorice) and papaya extract tablet  Will route to [facility] to review and advise

## 2019-06-28 NOTE — Telephone Encounter (Signed)
Continue Xarelto for now, increase hydration and F/U with primary care or urgent care (ideally today or tomorrow).  Xarelto was ordered yesterday to treat acute DVT and non-treated clot will cause complications.

## 2019-06-28 NOTE — Telephone Encounter (Signed)
Left message for patient to call and schedule Venous US test and 3 month appt. With Dr. Margaretann Loveless.

## 2019-06-28 NOTE — Telephone Encounter (Signed)
Patient called with [facility] info about OTC supplements

## 2019-06-28 NOTE — Telephone Encounter (Signed)
Spoke with pt who report he took his first dose of Xarelto today and has since experienced blood in urine on two occasions. He report it's initially dark red then turns lighter.  Will route to MD and Pharm D for recommendations.

## 2019-06-28 NOTE — Telephone Encounter (Signed)
Okay to take Tyleno with Xarelto.  Should avoid Naproxen (Aleve), aspirin, and Ibuprofen (motrin).  Okay to continue papaya extract but will recommend to HOLD DGL until Xarelto therapy completed (expected 3 months).

## 2019-06-28 NOTE — Telephone Encounter (Signed)
Patient also takes glucosamine, lutein, co-q10 occasionally  He would like to have these reviewed as well

## 2019-06-28 NOTE — Telephone Encounter (Signed)
Pt updated and verbalized understanding.  

## 2019-06-28 NOTE — Telephone Encounter (Signed)
Pt c/o medication issue:  1. Name of Medication: rivaroxaban (XARELTO) 20 MG TABS tablet  2. How are you currently taking this medication (dosage and times per day)? As directed  3. Are you having a reaction (difficulty breathing--STAT)?   4. What is your medication issue? Patient  is calling to follow up in regards to medication. He states the medication has caused him to experience blood in his urine, however he had not had any pain or other symptoms. Please call to discuss.

## 2019-06-28 NOTE — Telephone Encounter (Signed)
Those last 3 are fine.

## 2019-06-29 ENCOUNTER — Telehealth: Payer: Self-pay | Admitting: Internal Medicine

## 2019-06-29 NOTE — Telephone Encounter (Signed)
New Message  Pt wants Dr. Delphina Cahill nurse to give him a phone call asap. Did not want to leave message because he said the message was too long.  Please call to discuss

## 2019-06-29 NOTE — Telephone Encounter (Signed)
Attempted to contact patient- phone rang, then went to a busy signal.   Will try again later.

## 2019-06-29 NOTE — Telephone Encounter (Signed)
Patient returned call- stating that he wanted to make [place] aware that he no longer has bleeding in his urine-  Patient states that he is doing well now, he does have some kidney stones and he thinks maybe one of those was moving around.   Patient thankful for call back this afternoon.

## 2019-07-21 ENCOUNTER — Telehealth: Payer: Self-pay | Admitting: Internal Medicine

## 2019-07-21 MED ORDER — RIVAROXABAN 20 MG PO TABS: 20.0000 mg | ORAL_TABLET | Freq: Every day | ORAL | 0 refills | Status: DC

## 2019-07-21 NOTE — Telephone Encounter (Signed)
New Message  Pt called an was told that he has Le Venous scheduled for 09/27/19 @ 2:00 pm. Wanted an appt with Dr. Margaretann Loveless closer to that date but next availability was on 07/11/19. Wants to speak with nurse to see different alternatives to see what to do next to get the appts closer together.    Please call

## 2019-07-21 NOTE — Telephone Encounter (Signed)
Spoke to patient. Informed patient  First available appointment would be with 10/10/19 with Dr Margaretann Loveless.  DOPPLER TEST WILL BE PERFORMED ON 09/27/19 tech will probably give a preliminary. If there is a issue  D.O.D will take care of it or the will notify D Acharya.  patient states  He is now taking Xarelto 20 mg  . He has month refill . RN left 4 week samples for patient to pick up . He should have enough to see Dr Margaretann Loveless on next appt in July..   patient states he may need a cystoscopy -  Instruction given to patient to have  Urologist contact Office for clearance. He verbalized understanding.

## 2019-08-19 ENCOUNTER — Telehealth: Payer: Self-pay | Admitting: Internal Medicine

## 2019-08-19 NOTE — Telephone Encounter (Signed)
Patient calling stating he has kidney stones and his urologist wants to do a lithotripsy. He states he would like to know if he should have his vascular test sooner than 09/27/2019.

## 2019-08-19 NOTE — Telephone Encounter (Signed)
Spoke with pt who report his urologist is wanting to preform a  Lithotripsy due to kidney stones, but pt would be required to hold xarelto. Pt is scheduled for a lower extremity venous doppler on 7/13 to check on DVT but questioning if MD recommends he have doppler study prior to procedure.  Will route to MD for recommendations.

## 2019-08-19 NOTE — Telephone Encounter (Signed)
Would not recommend moving the LE doppler up, and in fact we do not have to perform it if he is feeling better and he should probably have his follow up with me before having dopplers performed.   Would recommend continuing anticoagulation for 3 mo given DVT, unless surgery/procedure is urgent.

## 2019-08-19 NOTE — Telephone Encounter (Signed)
Spoke with pt and advised of MD's recommendations. Appointment moved up to 6/10 at 11:40 am.

## 2019-08-21 ENCOUNTER — Other Ambulatory Visit: Payer: Self-pay | Admitting: Internal Medicine

## 2019-08-25 ENCOUNTER — Other Ambulatory Visit: Payer: Self-pay

## 2019-08-25 ENCOUNTER — Encounter: Payer: Self-pay | Admitting: Internal Medicine

## 2019-08-25 ENCOUNTER — Ambulatory Visit: Payer: Medicare Other | Admitting: Internal Medicine

## 2019-08-25 VITALS — BP 112/74 | HR 75 | Ht 65.0 in | Wt 173.0 lb

## 2019-08-25 DIAGNOSIS — I82452 Acute embolism and thrombosis of left peroneal vein: Secondary | ICD-10-CM

## 2019-08-25 DIAGNOSIS — Z01810 Encounter for preprocedural cardiovascular examination: Secondary | ICD-10-CM | POA: Diagnosis not present

## 2019-08-25 NOTE — Patient Instructions (Signed)
Medication Instructions:  NO CHANGES If you need a refill on your cardiac medications before your next appointment, please call your pharmacy*   Lab Work: NOT NEEDED If you have labs (blood work) drawn today and your tests are completely normal, you will receive your results only by: Marland Kitchen MyChart Message (if you have MyChart) OR . A paper copy in the mail If you have any lab test that is abnormal or we need to change your treatment, we will call you to review the results.   Testing/Procedures:  WILL CANCEL  DOPPLER   Follow-Up: At [facility], you and your health needs are our priority.  As part of our continuing mission to provide you with exceptional heart care, we have created designated Provider Care Teams.  These Care Teams include your primary Cardiologist (physician) and Advanced Practice Providers (APPs -  Physician Assistants and Nurse Practitioners) who all work together to provide you with the care you need, when you need it.    Your next appointment:   3 month(s)  The format for your next appointment:   In Person  Provider:   Cherlynn Kaiser, MD   Other Instructions

## 2019-08-25 NOTE — Progress Notes (Signed)
Cardiology Office Note:    Date:  08/25/2019   ID:  Andrew Moran, DOB October 13, 1945, MRN [id number]  PCP:  London Pepper, MD  Cardiologist:  No primary care provider on file.  Electrophysiologist:  None   Referring MD: London Pepper, MD   Chief Complaint: f/u DVT, anticipating lithotripsy   History of Present Illness:    Andrew Moran is a 74 y.o. male with a history of arthritis, asthma, diverticulitis, GERD, kidney stones, who presents for follow up of a LLE DVT. Provoked DVT noted on 06/27/19.   He visited his urologist and has a probable need for lithotripsy for large renal stone. He wonders if he can stop his Xarelto early. We discussed in detail need for 3 months of anticoagulation without cessation if possible prior to elective procedures. His symptoms have improved greatly and he no longer has left leg pain.   The patient denies chest pain, chest pressure, dyspnea at rest or with exertion, palpitations, PND, orthopnea, or leg swelling. Denies cough, fever, chills. Denies nausea, vomiting. Denies syncope or presyncope. Denies dizziness or lightheadedness.  Past Medical History:  Diagnosis Date  . Arthritis   . Asthma   . Back fracture 1988   L3 TO L 4  . Diverticulitis 2002  . Fracture, clavicle 1981   LEFT CLAVICLE  . GERD (gastroesophageal reflux disease)   . History of kidney stones   . Kidney stone 1967    Past Surgical History:  Procedure Laterality Date  . ANKLE SURGERY  1988   RIGHT FOOT WITH SCREWS  . ANKLE SURGERY  2008   SCREWS REMOVED FROM RIGHT FOOT  . BACK SURGERY  1995   LOWER BACK  . BACK SURGERY  1995   2ND BACK SURGERY LOWER BACK  . CARPAL TUNNEL RELEASE  1984   RIGHT   . CARPAL TUNNEL RELEASE  1983   LEFT HAND  . CYSTOSCOPY/RETROGRADE/URETEROSCOPY/STONE EXTRACTION WITH BASKET Bilateral 10/10/2013   Procedure: CYSTOSCOPY/URETEROSCOPY/STONE EXTRACTION WITH HOLMIUM LASER, bilateral retrograde, bilateral ureteral stents;  Surgeon: Jorja Loa, MD;  Location: [facility];  Service: Urology;  Laterality: Bilateral;  . EXPLORATORY LAPAROTOMY  1970   EXPLORATORY STOMACH SURGERY FOR TEAR IN STOMACH WALL  . HOLMIUM LASER APPLICATION Left 6/38/7564   Procedure: HOLMIUM LASER APPLICATION;  Surgeon: Jorja Loa, MD;  Location: [facility];  Service: Urology;  Laterality: Left;  . KNEE ARTHROSCOPY  02/05/2011   Procedure: ARTHROSCOPY KNEE;  Surgeon: Laurice Record [place];  Location: [facility];  Service: Orthopedics;  Laterality: Right;  Right knee arthroscopy partial lateral and medial meniscectomy with condyle debridement  . LITHOTRIPSY  1986  . LITHOTRIPSY  2001  . LITHOTRIPSY  2005  . LITHOTRIPSY  2008  . LOWER BACK INJECTION  2002  . LOWER BACK INJECTION  2011  . ROTATOR CUFF REPAIR  1997   RIGHT  . ROTATOR CUFF REPAIR  1997   LEFT  . ROTATOR CUFF REPAIR Right 02/16/2018  . TONSILLECTOMY  1951    AND ADENOIDS  . TOTAL KNEE ARTHROPLASTY Left 04/08/2019   Procedure: TOTAL KNEE ARTHROPLASTY;  Surgeon: Sydnee Cabal, MD;  Location: [facility];  Service: Orthopedics;  Laterality: Left;  with adductor canal    Current Medications: Current Meds  Medication Sig  . albuterol (PROVENTIL HFA;VENTOLIN HFA) 108 (90 BASE) MCG/ACT inhaler Inhale 2 puffs into the lungs every 4 (four) hours as needed for wheezing or shortness of breath. Wheezing   . APPLE CIDER VINEGAR PO Take 5 mLs  by mouth daily.  Marland Kitchen atorvastatin (LIPITOR) 20 MG tablet Take 20 mg by mouth at bedtime.   . budesonide-formoterol (SYMBICORT) 80-4.5 MCG/ACT inhaler Inhale 2 puffs into the lungs 2 (two) times daily.  . cholecalciferol (VITAMIN D3) 25 MCG (1000 UNIT) tablet Take 1,000 Units by mouth daily.  . methocarbamol (ROBAXIN) 500 MG tablet Take 1 tablet (500 mg total) by mouth 4 (four) times daily.  . Multiple Vitamins-Minerals (MULTIVITAMINS THER. W/MINERALS) TABS Take 1 tablet by mouth daily.   . ondansetron (ZOFRAN) 4 MG tablet Take 1 tablet (4 mg total) by mouth every 8  (eight) hours as needed for nausea or vomiting.  Marland Kitchen OVER THE COUNTER MEDICATION Take 2 tablets by mouth at bedtime. Deglycyrrhizinated-DGL  . oxyCODONE (ROXICODONE) 5 MG immediate release tablet Take 1-2 tabs PO q 4-6 hrs as needed for pain  . rivaroxaban (XARELTO) 20 MG TABS tablet Take 1 tablet (20 mg total) by mouth daily with supper.     Allergies:   Zithromax [azithromycin] and Betadine [povidone iodine]   Social History   Socioeconomic History  . Marital status: Married    Spouse name: Not on file  . Number of children: Not on file  . Years of education: Not on file  . Highest education level: Not on file  Occupational History  . Not on file  Tobacco Use  . Smoking status: Never Smoker  . Smokeless tobacco: Never Used  Vaping Use  . Vaping Use: Never used  Substance and Sexual Activity  . Alcohol use: Yes    Comment: OCCASIONAL  . Drug use: No  . Sexual activity: Yes  Other Topics Concern  . Not on file  Social History Narrative  . Not on file   Social Determinants of Health   Financial Resource Strain:   . Difficulty of Paying Living Expenses:   Food Insecurity:   . Worried About Charity fundraiser in the Last Year:   . Arboriculturist in the Last Year:   Transportation Needs:   . Film/video editor (Medical):   Marland Kitchen Lack of Transportation (Non-Medical):   Physical Activity:   . Days of Exercise per Week:   . Minutes of Exercise per Session:   Stress:   . Feeling of Stress :   Social Connections:   . Frequency of Communication with Friends and Family:   . Frequency of Social Gatherings with Friends and Family:   . Attends Religious Services:   . Active Member of Clubs or Organizations:   . Attends Archivist Meetings:   Marland Kitchen Marital Status:      Family History: The patient's family history includes Heart attack in his father.  ROS:   Please see the history of present illness.    All other systems reviewed and are  negative.  EKGs/Labs/Other Studies Reviewed:    The following studies were reviewed today:  EKG:  NSR   Recent Labs: 04/07/2019: ALT 24; BUN 15; Creatinine, Ser 0.92; Hemoglobin 13.7; Platelets 266; Potassium 4.0; Sodium 141  Recent Lipid Panel No results found for: CHOL, TRIG, HDL, CHOLHDL, VLDL, LDLCALC, LDLDIRECT  Physical Exam:    VS:  BP 112/74   Pulse 75   Ht 5\' 5"  (1.651 m)   Wt 173 lb (78.5 kg)   SpO2 97%   BMI 28.79 kg/m     Wt Readings from Last 5 Encounters:  08/25/19 173 lb (78.5 kg)  06/27/19 172 lb (78 kg)  04/07/19 177 lb 8  oz (80.5 kg)  03/21/19 184 lb 6.4 oz (83.6 kg)  11/08/18 180 lb (81.6 kg)     Constitutional: No acute distress Eyes: sclera non-icteric, normal conjunctiva and lids ENMT: normal dentition, moist mucous membranes Cardiovascular: regular rhythm, normal rate, no murmurs. S1 and S2 normal. Radial pulses normal bilaterally. No jugular venous distention.  Respiratory: clear to auscultation bilaterally GI : normal bowel sounds, soft and nontender. No distention.   MSK: extremities warm, well perfused. No edema.  NEURO: grossly nonfocal exam, moves all extremities. PSYCH: alert and oriented x 3, normal mood and affect.   ASSESSMENT:    1. Acute deep vein thrombosis (DVT) of left peroneal vein (HCC)   2. Preoperative cardiovascular examination    PLAN:    Acute deep vein thrombosis (DVT) of left peroneal vein (HCC) - Plan: EKG 12-Lead -briefly had hematuria for 1 day while initiating Xarelto. Now known to have renal stone, hematuria may be unrelated.  Needs to continue uninterrupted AC for 3 mos after diagnosis of provoked LLE DVT. After this period of time, he can discontinue AC. He tells me his procedure can be scheduled after cessation of anticoagulation.  - we discussed compression socks and overall risk of recurrent DVT.  Preoperative cardiovascular examination The patient is low risk for low risk procedure.  No further  cardiovascular testing is required prior to the procedure.  If this level of risk is acceptable to the patient and surgical team, the patient should be considered optimized from a cardiovascular standpoint.   Total time of encounter: 40 minutes total time of encounter, including 30 minutes spent in face-to-face patient care on the date of this encounter. This time includes coordination of care and counseling regarding above mentioned problem list. Remainder of non-face-to-face time involved reviewing chart documents/testing relevant to the patient encounter and documentation in the medical record. I have independently reviewed documentation from referring provider.   Cherlynn Kaiser, MD [place]  [facility]    Medication Adjustments/Labs and Tests Ordered: Current medicines are reviewed at length with the patient today.  Concerns regarding medicines are outlined above.  Orders Placed This Encounter  Procedures  . EKG 12-Lead   No orders of the defined types were placed in this encounter.   Patient Instructions  Medication Instructions:  NO CHANGES If you need a refill on your cardiac medications before your next appointment, please call your pharmacy*   Lab Work: NOT NEEDED If you have labs (blood work) drawn today and your tests are completely normal, you will receive your results only by: Marland Kitchen MyChart Message (if you have MyChart) OR . A paper copy in the mail If you have any lab test that is abnormal or we need to change your treatment, we will call you to review the results.   Testing/Procedures:  WILL CANCEL  DOPPLER   Follow-Up: At [facility], you and your health needs are our priority.  As part of our continuing mission to provide you with exceptional heart care, we have created designated Provider Care Teams.  These Care Teams include your primary Cardiologist (physician) and Advanced Practice Providers (APPs -  Physician Assistants and Nurse Practitioners) who  all work together to provide you with the care you need, when you need it.    Your next appointment:   3 month(s)  The format for your next appointment:   In Person  Provider:   Cherlynn Kaiser, MD   Other Instructions

## 2019-08-31 ENCOUNTER — Other Ambulatory Visit: Payer: Self-pay | Admitting: Urology

## 2019-08-31 ENCOUNTER — Telehealth: Payer: Self-pay | Admitting: Internal Medicine

## 2019-08-31 NOTE — Telephone Encounter (Signed)
° °  [facility] Pre-operative Risk Assessment    HEARTCARE STAFF: - Please ensure there is not already an duplicate clearance open for this procedure. - Under Visit Info/Reason for Call, type in Other and utilize the format Clearance MM/DD/YY or Clearance TBD. Do not use dashes or single digits. - If request is for dental extraction, please clarify the # of teeth to be extracted.  Request for surgical clearance:  1. What type of surgery is being performed? Shockwave Lithotripsy   2. When is this surgery scheduled? 10/06/19  3. What type of clearance is required (medical clearance vs. Pharmacy clearance to hold med vs. Both)? Both   4. Are there any medications that need to be held prior to surgery and how long? Xarelto & Asprin 72 hrs prior   5. Practice name and name of physician performing surgery? [facility], Dr. Jacalyn Lefevre   6. What is the office phone number? [phone]   7.   What is the office fax number? [phone]  8.   Anesthesia type (None, local, MAC, general) ? [place] 08/31/2019, 2:27 PM  _________________________________________________________________   (provider comments below)

## 2019-08-31 NOTE — Telephone Encounter (Signed)
Patient with diagnosis of provoked DVT diagnosed on 06/27/19 on Xarelto for anticoagulation (previously underwent TKA on 04/08/19). Plan is for 3 months of uninterrupted anticoagulation.  Procedure: shockwave lithotripsy Date of procedure: 10/06/19  CrCl 17mL/min Platelet count 102T  July 5 marks the completion date of 3 months of Xarelto treatment for DVT. Will forward to MD to see if pt is able to discontinue Xarelto permanently at that time, in which case clearance will not be needed since procedure is scheduled for 2.5 weeks after that.

## 2019-09-20 ENCOUNTER — Ambulatory Visit: Payer: Medicare Other | Admitting: Internal Medicine

## 2019-09-21 NOTE — Telephone Encounter (Signed)
   Primary Cardiologist: Elouise Munroe, MD  Chart reviewed as part of pre-operative protocol coverage. Given past medical history and time since last visit, based on ACC/AHA guidelines, ALARIC GLADWIN would be at acceptable risk for the planned procedure without further cardiovascular testing.   I will route this recommendation to the requesting party via Epic fax function and remove from pre-op pool.  Please call with questions.  Patient was recently cleared by Dr. Margaretann Loveless during recent office visit. I have forwarded the office note to the requesting provider. I do not see aspirin on his medication list. Only blood thinner is Xarelto for DVT diagnosed on venous doppler on 06/27/2019. Per Dr. Delphina Cahill note, "Needs to continue uninterrupted [facility] for 3 mos after diagnosis of provoked LLE DVT. After this period of time, he can discontinue AC." Since surgery is scheduled after the 3 month anticoagulation period, he may stop the Xarelto permanently after 7/12, therefore no need to concern with anticoagulation at the time of surgery.    [place], [place] 09/21/2019, 3:24 PM

## 2019-09-27 ENCOUNTER — Encounter (HOSPITAL_COMMUNITY): Payer: Medicare Other

## 2019-10-03 ENCOUNTER — Other Ambulatory Visit (HOSPITAL_COMMUNITY)
Admission: RE | Admit: 2019-10-03 | Discharge: 2019-10-03 | Disposition: A | Discharge status: Home / Self Care | Payer: Medicare Other | Source: Ambulatory Visit | Attending: Urology | Admitting: Urology

## 2019-10-03 DIAGNOSIS — Z20822 Contact with and (suspected) exposure to covid-19: Secondary | ICD-10-CM | POA: Insufficient documentation

## 2019-10-03 DIAGNOSIS — Z01812 Encounter for preprocedural laboratory examination: Secondary | ICD-10-CM | POA: Diagnosis present

## 2019-10-03 LAB — SARS CORONAVIRUS 2 (TAT 6-24 HRS): SARS Coronavirus 2: NEGATIVE

## 2019-10-03 NOTE — Progress Notes (Signed)
Patient to arrive at 0800 on 10/06/2019. History and medications reviewed. Patient has been off of xarelto since 7/14, completing prescribed course of therapy post knee surgery. All pre-procedure instructions given. NPO after MN. May use Symbicort inhaler in am if needed. Driver secured.

## 2019-10-06 ENCOUNTER — Encounter (HOSPITAL_BASED_OUTPATIENT_CLINIC_OR_DEPARTMENT_OTHER): Payer: Self-pay | Admitting: Urology

## 2019-10-06 ENCOUNTER — Encounter (HOSPITAL_BASED_OUTPATIENT_CLINIC_OR_DEPARTMENT_OTHER)
Admission: RE | Disposition: A | Discharge status: Home / Self Care | Payer: Self-pay | Source: Home / Self Care | Attending: Urology

## 2019-10-06 ENCOUNTER — Other Ambulatory Visit: Payer: Self-pay

## 2019-10-06 ENCOUNTER — Ambulatory Visit (HOSPITAL_COMMUNITY): Payer: Medicare Other

## 2019-10-06 ENCOUNTER — Ambulatory Visit (HOSPITAL_BASED_OUTPATIENT_CLINIC_OR_DEPARTMENT_OTHER)
Admission: RE | Admit: 2019-10-06 | Discharge: 2019-10-06 | Disposition: A | Discharge status: Home / Self Care | Payer: Medicare Other | Attending: Urology | Admitting: Urology

## 2019-10-06 DIAGNOSIS — N200 Calculus of kidney: Secondary | ICD-10-CM

## 2019-10-06 DIAGNOSIS — Z881 Allergy status to other antibiotic agents status: Secondary | ICD-10-CM | POA: Insufficient documentation

## 2019-10-06 DIAGNOSIS — Z79899 Other long term (current) drug therapy: Secondary | ICD-10-CM | POA: Diagnosis not present

## 2019-10-06 DIAGNOSIS — N132 Hydronephrosis with renal and ureteral calculous obstruction: Secondary | ICD-10-CM | POA: Insufficient documentation

## 2019-10-06 DIAGNOSIS — Z86718 Personal history of other venous thrombosis and embolism: Secondary | ICD-10-CM | POA: Insufficient documentation

## 2019-10-06 DIAGNOSIS — Z7951 Long term (current) use of inhaled steroids: Secondary | ICD-10-CM | POA: Diagnosis not present

## 2019-10-06 DIAGNOSIS — J45909 Unspecified asthma, uncomplicated: Secondary | ICD-10-CM | POA: Diagnosis not present

## 2019-10-06 HISTORY — PX: EXTRACORPOREAL SHOCK WAVE LITHOTRIPSY: SHX1557

## 2019-10-06 SURGERY — LITHOTRIPSY, ESWL
Anesthesia: LOCAL | Laterality: Left

## 2019-10-06 MED ORDER — DIAZEPAM 5 MG PO TABS
ORAL_TABLET | ORAL | Status: AC
  Filled 2019-10-06: qty 2

## 2019-10-06 MED ORDER — CIPROFLOXACIN HCL 500 MG PO TABS
ORAL_TABLET | ORAL | Status: AC
  Filled 2019-10-06: qty 1

## 2019-10-06 MED ORDER — HYDROCODONE-ACETAMINOPHEN 5-325 MG PO TABS: 1.0000 | ORAL_TABLET | Freq: Four times a day (QID) | ORAL | 0 refills | Status: DC | PRN

## 2019-10-06 MED ORDER — DIAZEPAM 5 MG PO TABS
10.0000 mg | ORAL_TABLET | ORAL | Status: AC
  Administered 2019-10-06: 10 mg via ORAL

## 2019-10-06 MED ORDER — DIPHENHYDRAMINE HCL 25 MG PO CAPS
25.0000 mg | ORAL_CAPSULE | ORAL | Status: AC
  Administered 2019-10-06: 25 mg via ORAL

## 2019-10-06 MED ORDER — SODIUM CHLORIDE 0.9 % IV SOLN: INTRAVENOUS | Status: DC

## 2019-10-06 MED ORDER — CIPROFLOXACIN HCL 500 MG PO TABS
500.0000 mg | ORAL_TABLET | ORAL | Status: AC
  Administered 2019-10-06: 500 mg via ORAL

## 2019-10-06 MED ORDER — DIPHENHYDRAMINE HCL 25 MG PO CAPS
ORAL_CAPSULE | ORAL | Status: AC
  Filled 2019-10-06: qty 1

## 2019-10-06 MED ORDER — TAMSULOSIN HCL 0.4 MG PO CAPS: 0.4000 mg | ORAL_CAPSULE | Freq: Every day | ORAL | 0 refills | Status: DC

## 2019-10-06 NOTE — Op Note (Signed)
See [facility] OP note scanned into chart. Also because of the size, density, location and other factors that cannot be anticipated I feel this will likely be a staged procedure. This fact supersedes any indication in the scanned [facility] operative note to the contrary.  

## 2019-10-06 NOTE — H&P (Signed)
Please see note from [facility] scanned into patient chart.

## 2019-10-06 NOTE — Discharge Instructions (Signed)
Post Anesthesia Home Care Instructions  Activity: Get plenty of rest for the remainder of the day. A responsible adult should stay with you for 24 hours following the procedure.  For the next 24 hours, DO NOT: -Drive a car -Operate machinery -Drink alcoholic beverages -Take any medication unless instructed by your physician -Make any legal decisions or sign important papers.  Meals: Start with liquid foods such as gelatin or soup. Progress to regular foods as tolerated. Avoid greasy, spicy, heavy foods. If nausea and/or vomiting occur, drink only clear liquids until the nausea and/or vomiting subsides. Call your physician if vomiting continues.  Special Instructions/Symptoms: Your throat may feel dry or sore from the anesthesia or the breathing tube placed in your throat during surgery. If this causes discomfort, gargle with warm salt water. The discomfort should disappear within 24 hours.  If you had a scopolamine patch placed behind your ear for the management of post- operative nausea and/or vomiting:  1. The medication in the patch is effective for 72 hours, after which it should be removed.  Wrap patch in a tissue and discard in the trash. Wash hands thoroughly with soap and water. 2. You may remove the patch earlier than 72 hours if you experience unpleasant side effects which may include dry mouth, dizziness or visual disturbances. 3. Avoid touching the patch. Wash your hands with soap and water after contact with the patch.   Lithotripsy, Care After This sheet gives you information about how to care for yourself after your procedure. Your health care provider may also give you more specific instructions. If you have problems or questions, contact your health care provider. What can I expect after the procedure? After the procedure, it is common to have:  Some blood in your urine. This should only last for a few days.  Soreness in your back, sides, or upper abdomen for a few  days.  Blotches or bruises on your back where the pressure wave entered the skin.  Pain, discomfort, or nausea when pieces (fragments) of the kidney stone move through the tube that carries urine from the kidney to the bladder (ureter). Stone fragments may pass soon after the procedure, but they may continue to pass for up to 4-8 weeks. ? If you have severe pain or nausea, contact your health care provider. This may be caused by a large stone that was not broken up, and this may mean that you need more treatment.  Some pain or discomfort during urination.  Some pain or discomfort in the lower abdomen or (in men) at the base of the penis. Follow these instructions at home: Medicines  Take over-the-counter and prescription medicines only as told by your health care provider.  If you were prescribed an antibiotic medicine, take it as told by your health care provider. Do not stop taking the antibiotic even if you start to feel better.  Do not drive for 24 hours if you were given a medicine to help you relax (sedative).  Do not drive or use heavy machinery while taking prescription pain medicine. Eating and drinking      Drink enough water and fluids to keep your urine clear or pale yellow. This helps any remaining pieces of the stone to pass. It can also help prevent new stones from forming.  Eat plenty of fresh fruits and vegetables.  Follow instructions from your health care provider about eating and drinking restrictions. You may be instructed: ? To reduce how much salt (sodium) you eat   or drink. Check ingredients and nutrition facts on packaged foods and beverages. ? To reduce how much meat you eat.  Eat the recommended amount of calcium for your age and gender. Ask your health care provider how much calcium you should have. General instructions  Get plenty of rest.  Most people can resume normal activities 1-2 days after the procedure. Ask your health care provider what  activities are safe for you.  Your health care provider may direct you to lie in a certain position (postural drainage) and tap firmly (percuss) over your kidney area to help stone fragments pass. Follow instructions as told by your health care provider.  If directed, strain all urine through the strainer that was provided by your health care provider. ? Keep all fragments for your health care provider to see. Any stones that are found may be sent to a medical lab for examination. The stone may be as small as a grain of salt.  Keep all follow-up visits as told by your health care provider. This is important. Contact a health care provider if:  You have pain that is severe or does not get better with medicine.  You have nausea that is severe or does not go away.  You have blood in your urine longer than your health care provider told you to expect.  You have more blood in your urine.  You have pain during urination that does not go away.  You urinate more frequently than usual and this does not go away.  You develop a rash or any other possible signs of an allergic reaction. Get help right away if:  You have severe pain in your back, sides, or upper abdomen.  You have severe pain while urinating.  Your urine is very dark red.  You have blood in your stool (feces).  You cannot pass any urine at all.  You feel a strong urge to urinate after emptying your bladder.  You have a fever or chills.  You develop shortness of breath, difficulty breathing, or chest pain.  You have severe nausea that leads to persistent vomiting.  You faint. Summary  After this procedure, it is common to have some pain, discomfort, or nausea when pieces (fragments) of the kidney stone move through the tube that carries urine from the kidney to the bladder (ureter). If this pain or nausea is severe, however, you should contact your health care provider.  Most people can resume normal activities 1-2  days after the procedure. Ask your health care provider what activities are safe for you.  Drink enough water and fluids to keep your urine clear or pale yellow. This helps any remaining pieces of the stone to pass, and it can help prevent new stones from forming.  If directed, strain your urine and keep all fragments for your health care provider to see. Fragments or stones may be as small as a grain of salt.  Get help right away if you have severe pain in your back, sides, or upper abdomen or have severe pain while urinating. This information is not intended to replace advice given to you by your health care provider. Make sure you discuss any questions you have with your health care provider. Document Revised: 06/14/2018 Document Reviewed: 01/23/2016 Elsevier Patient Education  2020 [place] discharge instructions in chart.

## 2019-10-07 ENCOUNTER — Encounter (HOSPITAL_BASED_OUTPATIENT_CLINIC_OR_DEPARTMENT_OTHER): Payer: Self-pay | Admitting: Urology

## 2019-10-10 ENCOUNTER — Ambulatory Visit: Payer: Medicare Other | Admitting: Internal Medicine

## 2019-10-11 ENCOUNTER — Encounter (HOSPITAL_BASED_OUTPATIENT_CLINIC_OR_DEPARTMENT_OTHER): Payer: Self-pay | Admitting: Urology

## 2019-11-29 ENCOUNTER — Ambulatory Visit: Payer: Medicare Other | Admitting: Internal Medicine

## 2019-12-16 ENCOUNTER — Telehealth: Payer: Self-pay | Admitting: Internal Medicine

## 2019-12-16 ENCOUNTER — Other Ambulatory Visit: Payer: Self-pay | Admitting: Internal Medicine

## 2019-12-16 MED ORDER — BUDESONIDE-FORMOTEROL FUMARATE 80-4.5 MCG/ACT IN AERO: 2.0000 | INHALATION_SPRAY | Freq: Two times a day (BID) | RESPIRATORY_TRACT | 3 refills | Status: DC

## 2019-12-16 NOTE — Telephone Encounter (Signed)
Spoke with the pt and notified overdue appt  Scheduled him next available with Dr Melvyn Novas for 02/20/20 at 2 pm  Symbicort refilled to last until appt

## 2020-01-25 ENCOUNTER — Other Ambulatory Visit: Payer: Self-pay | Admitting: Urology

## 2020-02-16 ENCOUNTER — Encounter (HOSPITAL_BASED_OUTPATIENT_CLINIC_OR_DEPARTMENT_OTHER): Payer: Self-pay | Admitting: Urology

## 2020-02-16 ENCOUNTER — Other Ambulatory Visit: Payer: Self-pay

## 2020-02-16 NOTE — Progress Notes (Signed)
Spoke w/ via phone for pre-op interview--- PT Lab needs dos----   no            Lab results------ no COVID test ------ 02-20-2020 @ 0910 Arrive at ------- 0700 NPO after MN NO Solid Food.  Clear liquids from MN until--- 0600 Medications to take morning of surgery ----- Symbicort inhaler Diabetic medication ----- n/a Patient Special Instructions ----- asked to bring rescue inhaler, pt stated if he can find it and it's date he will bring Pre-Op special Istructions ----- n/a Patient verbalized understanding of instructions that were given at this phone interview. Patient denies shortness of breath, chest pain, fever, cough at this phone interview.

## 2020-02-20 ENCOUNTER — Encounter: Payer: Self-pay | Admitting: Internal Medicine

## 2020-02-20 ENCOUNTER — Ambulatory Visit: Payer: Medicare Other | Admitting: Internal Medicine

## 2020-02-20 ENCOUNTER — Other Ambulatory Visit: Payer: Self-pay

## 2020-02-20 ENCOUNTER — Other Ambulatory Visit (HOSPITAL_COMMUNITY)
Admission: RE | Admit: 2020-02-20 | Discharge: 2020-02-20 | Disposition: A | Discharge status: Home / Self Care | Payer: Medicare Other | Source: Ambulatory Visit | Attending: Urology | Admitting: Urology

## 2020-02-20 DIAGNOSIS — J45991 Cough variant asthma: Secondary | ICD-10-CM

## 2020-02-20 DIAGNOSIS — Z01812 Encounter for preprocedural laboratory examination: Secondary | ICD-10-CM | POA: Insufficient documentation

## 2020-02-20 DIAGNOSIS — Z20822 Contact with and (suspected) exposure to covid-19: Secondary | ICD-10-CM | POA: Insufficient documentation

## 2020-02-20 LAB — SARS CORONAVIRUS 2 (TAT 6-24 HRS): SARS Coronavirus 2: NEGATIVE

## 2020-02-20 NOTE — Patient Instructions (Signed)
At the onset of any cough/ throat clearing hoarseness  Add back :  Try prilosec otc 20mg   Take 30-60 min before first meal of the day and Pepcid ac (famotidine) 20 mg one after supper until better  for at least a week    GERD (REFLUX)  is an extremely common cause of respiratory symptoms just like yours , many times with no obvious heartburn at all.    It can be treated with medication, but also with lifestyle changes including elevation of the head of your bed (ideally with 6 -8inch blocks under the headboard of your bed),  Smoking cessation, avoidance of late meals, excessive alcohol, and avoid fatty foods, chocolate, peppermint, colas, red wine, and acidic juices such as orange juice.  NO MINT OR MENTHOL PRODUCTS SO NO COUGH DROPS  USE SUGARLESS CANDY INSTEAD (Jolley ranchers or Stover's or Life Savers) or even ice chips will also do - the key is to swallow to prevent all throat clearing. NO OIL BASED VITAMINS - use powdered substitutes.  Avoid fish oil when coughing.   Please schedule a follow up visit in 6 months but call sooner if needed

## 2020-02-20 NOTE — Progress Notes (Signed)
Subjective:   Patient ID: AMJAD FIKES, male    DOB: [birth year],   MRN: [id number]     Brief patient profile:  62  yowm never smoker dx as asthma as child growing up in [place] mostly in winter months very limited activity due to sob and summer better s limits improved in his 39s then recurred in his 21's on prn saba but in 2000's required daily treatment with advair but only helped a little and still needed freq saba and saw Dr Bernita Buffy with no resp to shots so self referred to pulmonary clinic 06/26/2016 for refractory cough /sob with nl spirometry at initial ov.     History of Present Illness  06/26/2016 1st [facility] office visit/ Savon Bordonaro   Chief Complaint  Patient presents with  . Advice Only    self referral for emphysema.  worsening sob, nonprod cough Xseveral months.   ? Chronic asthma seen here  By me and By Dr Joya Gaskins around 2006 but does not remember seeing [place] and whether this helped but now comes in with worsening dry cough / throat irritation and doe indolent onset gradually worse x 3 m esp p supper  But does ok sleeping at 45 degrees  s nasal symptoms at all some better p saba  rec Stop advair  Plan A = Automatic = symbicort 80 Take 2 puffs first thing in am and then another 2 puffs about 12 hours later.  Work on inhaler technique: Plan B = Backup Only use your albuterol as a rescue medication GERD diet   Add : Pantoprazole (protonix) 40 mg   Take  30-60 min before first meal of the day and Pepcid (famotidine)  20 mg one @  Bedtime     07/30/2016  f/u ov/Charlcie Prisco re:  Cough variant asthma vs uacs/ much better on gerd rx plus back on advair 250 p ran out of symbicort 80 Chief Complaint  Patient presents with  . Follow-up    Pt states breathing has improved some and he is coughing less. Voice still sounds worse.   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                 And continue the reflux medications  Plan B = Backup Only use your albuterol (Proair)  as a rescue medication        01/28/2017  f/u ov/Keoki Mchargue re:  Cough varant asthma on symb 80 2bid  Chief Complaint  Patient presents with  . Follow-up    Increased cough since the weather turned cold "tickle in throat". Cough is non prod. He uses his albuterol inhaler 2-3 x per wk on average.   was doing great on symb 80 2bid and gerd rx and no problems with exertion or sleeping or need for saba Then with onset cold weather gradually worse cough/ sob and started needing rescue by 8 h p symbicort each afternoon  Does fine overnight  rec Try symbicort 160 Take 2 puffs first thing in am and then another 2 puffs about 12 hours later.  If the symbicort 160 helps you with your  winter cough and reduces your need for albuterol in the afternoons then continue the 160 but call for prescription - if not, then just continue the 80 strength       11/09/2017  f/u ov/Kyara Boxer re:  Cough variant with ? Component uacs/ doing better  on symb 160 than 80 Chief Complaint  Patient presents with  . Follow-up    Pt states he has been doing good since last visit. Pt states he did have some mild complaints of SOB x1week ago and had so use his rescue inhaler a couple of times and wonders if he ate something that didn't agree with him.  Dyspnea:  Not limited by breathing from desired activities   Cough: minimal Sleeping: ok / slt elevation hob SABA use: rarely  Not using ppi prn now s many flares  rec For itching/ sneezing/ runny nose > zyrtec or clariton/allegra only as needed  At the onset of any cough/ throat clearing immediately start back on protonix 40 mg Take 30-60 min before first meal of the day     05/10/2018  f/u  ov/Tremel Setters re: cough variant asthma on symb 160 x 2 hs /  1 or 2 in am "prn" Chief Complaint  Patient presents with  . Follow-up    Breathing is overall doing well. He has not had to use his albuterol inhaler.   Dyspnea:  Not limited by breathing from desired activities   Cough: no, just sometimes with talking  Sleeping: in a recliner due to shoulder  SABA use: not using saba  rec Plan A = Automatic = symbiocrt 160 up to 2 pffs every 12 hours  Plan B = Backup Only use your albuterol inhaler as a rescue medication    11/08/2018  f/u ov/Carrie Schoonmaker re:  Cough variant asthma ? Cough  Better / but breathing is not better  Chief Complaint  Patient presents with  . Follow-up    He has occ throat clearing- more in the mornings.   Dyspnea:  Walks the neighborhood x 20-25 min then around 7 pm starts having tightness nightly since last visit takes symb 160 x 2 pffs at hs only  Cough: in am's clearing the throat but does not wake up prematurely or produce much mucus at all now  Sleeping: in recliner x 30 degrees due to shoulders  SABA use: rarely  02: none rec Plan A = Automatic = symbicort 80 Take 2 puffs first thing in am and then another 2 puffs about 12 hours later - ok to take the pm dose 15 min before any late day (your 7pm) exercise Work on inhaler technique:  Plan B = Backup Only use your albuterol inhaler as a rescue medication    03/21/2019  f/u ov/Nazareth Norenberg re: cough variant asthma on symb 80 2bid  Chief Complaint  Patient presents with  . Follow-up    no symptoms  Dyspnea:  Not limited by breathing from desired activities   Cough: esp in am  Sleeping: 30 degrees recliner due to shoulder pain SABA use: rarely  02: none rec No change    02/20/2020  f/u ov/Oday Ridings re: cough variant asthma / on symb 80 2bid / no longer on gerd rx Chief Complaint  Patient presents with  . Follow-up    non productive cough in mornings  Dyspnea:  Not limited by breathing from desired activities   Cough: slt  raspy in am s much sputum production Sleeping: 30 degrees with reclining bed  SABA use:  very rarely using  02: none    No obvious day to day or daytime variability or assoc excess/ purulent sputum or mucus plugs or hemoptysis or cp or chest tightness, subjective wheeze or overt sinus or hb symptoms.   sleeping without nocturnal    exacerbation  of respiratory  c/o's or need for noct saba. Also denies any obvious fluctuation of symptoms with weather or environmental changes or other aggravating or alleviating factors except as outlined above   No unusual exposure hx or h/o childhood pna/ asthma or knowledge of premature birth.  Current Allergies, Complete Past Medical History, Past Surgical History, Family History, and Social History were reviewed in [employer] record.  ROS  The following are not active complaints unless bolded Hoarseness, sore throat, dysphagia, dental problems, itching, sneezing,  nasal congestion or discharge of excess mucus or purulent secretions, ear ache,   fever, chills, sweats, unintended wt loss or wt gain, classically pleuritic or exertional cp,  orthopnea pnd or arm/hand swelling  or leg swelling, presyncope, palpitations, abdominal pain, anorexia, nausea, vomiting, diarrhea  or change in bowel habits or change in bladder habits, change in stools or change in urine, dysuria, hematuria,  rash, arthralgias, visual complaints, headache, numbness, weakness or ataxia or problems with walking or coordination,  change in mood or  memory.        Current Meds  Medication Sig  . albuterol (PROVENTIL HFA;VENTOLIN HFA) 108 (90 BASE) MCG/ACT inhaler Inhale 2 puffs into the lungs every 4 (four) hours as needed for wheezing or shortness of breath. Wheezing   . APPLE CIDER VINEGAR PO Take 5 mLs by mouth daily.  Marland Kitchen atorvastatin (LIPITOR) 20 MG tablet Take 20 mg by mouth at bedtime.   . budesonide-formoterol (SYMBICORT) 80-4.5 MCG/ACT inhaler Inhale 2 puffs  into the lungs 2 (two) times daily. (Patient taking differently: Inhale 2 puffs into the lungs 2 (two) times daily. )  . methocarbamol (ROBAXIN) 500 MG tablet Take 1 tablet (500 mg total) by mouth 4 (four) times daily.  . Multiple Vitamins-Minerals (MULTIVITAMINS THER. W/MINERALS) TABS Take 1 tablet by mouth daily.   Marland Kitchen OVER THE COUNTER MEDICATION Take 2 tablets by mouth at bedtime. Deglycyrrhizinated-DGL              Objective:   Physical Exam   amb pleasant wm slt raspy voice, some throat clearing / nad   02/20/2020        172   03/21/2019          184  05/10/2018        178  11/09/2017        183  05/11/2017        184  01/28/2017      180  10/29/2016        174   06/26/16 182 lb (82.6 kg)  10/10/13 181 lb (82.1 kg)  09/30/13 181 lb (82.1 kg)      Vital signs reviewed  02/20/2020  - Note at rest 02 sats  98   HEENT : pt wearing mask not removed for exam due to covid -19 concerns.    NECK :  without JVD/Nodes/TM/ nl carotid upstrokes bilaterally   LUNGS: no acc muscle use,  Nl contour chest which is clear to A and P bilaterally without cough on insp or exp maneuvers   CV:  RRR  no s3 or murmur or increase in P2, and no edema   ABD:  soft and nontender with nl  inspiratory excursion in the supine position. No bruits or organomegaly appreciated, bowel sounds nl  MS:  Nl gait/ ext warm without deformities, calf tenderness, cyanosis or clubbing No obvious joint restrictions   SKIN: warm and dry without lesions    NEURO:  alert, approp, nl sensorium with  no motor or cerebellar deficits apparent.            Assessment & Plan:

## 2020-02-20 NOTE — Assessment & Plan Note (Addendum)
Onset in Childhood / recurred in his 43s Spirometry 06/26/2016  FEV1 2.35  (90%)  Ratio 70 with min curvature p am advair - FENO 06/26/2016  =   18 p am advair  - 06/26/2016   so try symb 80 2bid and stop advair - 10/29/2016    reduce symb back to 80 2bid and continue gerd x 3 months - 01/28/2017 flared on symb 80 with lots of upper airway symptoms  - FENO 01/28/2017  =   20 on symb 80 with flare of symptoms  so rec trial of 160 2bid   - Allergy profile 01/28/2017 >  Eos 0.4/  IgE  49  RAST pos dust /Cat/tree/ ragweed - FENO 05/10/2018  =   22 on symb 160 2 hs and prn daytime dosing  - maint on symb 80 2bid and no gerd rx as of 02/20/2020 > rec add back gerd if flare  Difficult to tease out asthma vs Upper airway cough syndrome (previously labeled PNDS),  is so named because it's frequently impossible to sort out how much is  CR/sinusitis with freq throat clearing (which can be related to primary GERD)   vs  causing  secondary (" extra esophageal")  GERD from wide swings in gastric pressure that occur with throat clearing, often  promoting self use of mint and menthol lozenges that reduce the lower esophageal sphincter tone and exacerbate the problem further in a cyclical fashion.   These are the same pts (now being labeled as having "irritable larynx syndrome" by some cough centers) who not infrequently have a history of having failed to tolerate ace inhibitors,  dry powder inhalers or biphosphonates or report having atypical/extraesophageal reflux symptoms that don't respond to standard doses of PPI  and are easily confused as having aecopd or asthma flares by even experienced allergists/ pulmonologists (myself included).   Reviewed goals of asthma care with pt and contingencies including adding back gerd rx otc if flares  Re symbicort: ok to taper Based on two studies from NEJM  378; 20 p 1865 (2018) and 380 : p2020-30 (2019) in pts with mild asthma it is reasonable to use low dose symbicort eg 80 2bid  "prn" flare in this setting but I emphasized this was only shown with symbicort and takes advantage of the rapid onset of action but is not the same as "rescue therapy" but can be stopped once the acute symptoms have resolved and the need for rescue has been minimized (< 2 x weekly)            Each maintenance medication was reviewed in detail including emphasizing most importantly the difference between maintenance and prns and under what circumstances the prns are to be triggered using an action plan format where appropriate.  Total time for H and P, chart review, counseling, reviewing hfa device and generating customized AVS unique to this office visit / charting = 26 min

## 2020-02-22 NOTE — H&P (Signed)
H&P  Chief Complaint: Left renal calculi  History of Present Illness: 74 year old male with history of recurrent urolithiasis.  He presents at this time for cystoscopy, left retrograde, left ureteroscopy and laser treatment and extraction of multiple calculi in his left lower pole.  These are remaining from a shockwave lithotripsy performed on 7.22.2021.  I discussed trying to remove all stone burden from the patient's left kidney.  He understands the procedure, its risks and complications and desires to proceed.  Past Medical History:  Diagnosis Date  . Arthritis   . Asthma    pulmology-- dr wert--- cough varient versus uacs  . GERD (gastroesophageal reflux disease)    per pt takes DGL supplement  . History of diverticulitis of colon 2002   managed medically , no surgical intervention  . History of DVT of lower extremity 06/27/2019   followed by dr g. Margaretann Loveless--  completed 3 months xarelto 07/ 2021  . History of kidney stones   . Renal calculi    left side    Past Surgical History:  Procedure Laterality Date  . ANKLE HARDWARE REMOVAL Right 2008  . CARPAL TUNNEL RELEASE Bilateral left 1983;  right 1984  . CYSTOSCOPY/RETROGRADE/URETEROSCOPY/STONE EXTRACTION WITH BASKET Bilateral 10/10/2013   Procedure: CYSTOSCOPY/URETEROSCOPY/STONE EXTRACTION WITH HOLMIUM LASER, bilateral retrograde, bilateral ureteral stents;  Surgeon: Jorja Loa, MD;  Location: [facility];  Service: Urology;  Laterality: Bilateral;  . EXPLORATORY LAPAROTOMY  1970   EXPLORATORY STOMACH SURGERY FOR TEAR IN STOMACH WALL  . EXTRACORPOREAL SHOCK WAVE LITHOTRIPSY Left 10/06/2019   Procedure: EXTRACORPOREAL SHOCK WAVE LITHOTRIPSY (ESWL);  Surgeon: Robley Fries, MD;  [facility];  Service: Urology;  Laterality: Left;  . EXTRACORPOREAL SHOCK WAVE LITHOTRIPSY  multiple since 2002,  last one 02-28-2013  . HOLMIUM LASER APPLICATION Left 9/62/2297   Procedure: HOLMIUM LASER APPLICATION;  Surgeon:  Jorja Loa, MD;  Location: [facility];  Service: Urology;  Laterality: Left;  . KNEE ARTHROSCOPY  02/05/2011   Procedure: ARTHROSCOPY KNEE;  Surgeon: Laurice Record Aplington;  Location: [facility];  Service: Orthopedics;  Laterality: Right;  Right knee arthroscopy partial lateral and medial meniscectomy with condyle debridement  . LUMBAR SPINE SURGERY  x2  1995  . ORIF ANKLE FRACTURE Right 1988  . ROTATOR CUFF REPAIR Bilateral right 02/16/2018;  left 1997  . TONSILLECTOMY AND ADENOIDECTOMY  1951  . TOTAL KNEE ARTHROPLASTY Left 04/08/2019   Procedure: TOTAL KNEE ARTHROPLASTY;  Surgeon: Sydnee Cabal, MD;  Location: [facility];  Service: Orthopedics;  Laterality: Left;  with adductor canal    Home Medications:  Allergies as of 02/22/2020      Reactions   Zithromax [azithromycin] Shortness Of Breath   Betadine [povidone Iodine] Itching   Iodine drape, if left on too long      Medication List    Notice   Cannot display discharge medications because the patient has not yet been admitted.     Allergies:  Allergies  Allergen Reactions  . Zithromax [Azithromycin] Shortness Of Breath  . Betadine [Povidone Iodine] Itching    Iodine drape, if left on too long    Family History  Problem Relation Age of Onset  . Heart attack Father     Social History:  reports that he has never smoked. He has never used smokeless tobacco. He reports current alcohol use. He reports that he does not use drugs.  ROS: A complete review of systems was performed.  All systems are negative except for pertinent findings  as noted.  Physical Exam:  Vital signs in last 24 hours: Ht 5\' 5"  (1.651 m)   Wt 76.2 kg   BMI 27.96 kg/m  Constitutional:  Alert and oriented, No acute distress Cardiovascular: Regular rate  Respiratory: Normal respiratory effort GI: Abdomen is soft, nontender, nondistended, no abdominal masses. No CVAT.  Genitourinary: Normal male phallus, testes are descended bilaterally and non-tender and  without masses, scrotum is normal in appearance without lesions or masses, perineum is normal on inspection. Lymphatic: No lymphadenopathy Neurologic: Grossly intact, no focal deficits Psychiatric: Normal mood and affect  Laboratory Data:  No results for input(s): WBC, HGB, HCT, PLT in the last 72 hours.  No results for input(s): NA, K, CL, GLUCOSE, BUN, CALCIUM, CREATININE in the last 72 hours.  Invalid input(s): CO3   No results found for this or any previous visit (from the past 24 hour(s)). Recent Results (from the past 240 hour(s))  SARS CORONAVIRUS 2 (TAT 6-24 HRS) Nasopharyngeal Nasopharyngeal Swab     Status: None   Collection Time: 02/20/20  9:05 AM   Specimen: Nasopharyngeal Swab  Result Value Ref Range Status   SARS Coronavirus 2 NEGATIVE NEGATIVE Final    Comment: (NOTE) SARS-CoV-2 target nucleic acids are NOT DETECTED.  The SARS-CoV-2 RNA is generally detectable in upper and lower respiratory specimens during the acute phase of infection. Negative results do not preclude SARS-CoV-2 infection, do not rule out co-infections with other pathogens, and should not be used as the sole basis for treatment or other patient management decisions. Negative results must be combined with clinical observations, patient history, and epidemiological information. The expected result is Negative.  Fact Sheet for Patients: SugarRoll.be  Fact Sheet for Healthcare Providers: https://www.woods-mathews.com/  This test is not yet approved or cleared by the [place] FDA and  has been authorized for detection and/or diagnosis of SARS-CoV-2 by FDA under an Emergency Use Authorization (EUA). This EUA will remain  in effect (meaning this test can be used) for the duration of the COVID-19 declaration under Se ction 564(b)(1) of the Act, 21 U.S.C. section 360bbb-3(b)(1), unless the authorization is terminated or revoked sooner.  Performed at  [facility], [place] [address], [place], [place] [address]     Renal Function: No results for input(s): CREATININE in the last 168 hours. CrCl cannot be calculated (Patient's most recent lab result is older than the maximum 21 days allowed.).  Radiologic Imaging: No results found.  Impression/Assessment:  Multiple left lower pole renal calculi, 17 to 18 mm in size  Plan:  Cystoscopy, left retrograde ureteropyelogram, left ureteroscopy, possible laser and excision of multiple left renal calculi

## 2020-02-23 ENCOUNTER — Ambulatory Visit (HOSPITAL_BASED_OUTPATIENT_CLINIC_OR_DEPARTMENT_OTHER): Payer: Medicare Other | Admitting: Anesthesiology

## 2020-02-23 ENCOUNTER — Encounter (HOSPITAL_BASED_OUTPATIENT_CLINIC_OR_DEPARTMENT_OTHER)
Admission: RE | Disposition: A | Discharge status: Home / Self Care | Payer: Self-pay | Source: Home / Self Care | Attending: Urology

## 2020-02-23 ENCOUNTER — Ambulatory Visit (HOSPITAL_BASED_OUTPATIENT_CLINIC_OR_DEPARTMENT_OTHER)
Admission: RE | Admit: 2020-02-23 | Discharge: 2020-02-23 | Disposition: A | Discharge status: Home / Self Care | Payer: Medicare Other | Attending: Urology | Admitting: Urology

## 2020-02-23 ENCOUNTER — Encounter (HOSPITAL_BASED_OUTPATIENT_CLINIC_OR_DEPARTMENT_OTHER): Payer: Self-pay | Admitting: Urology

## 2020-02-23 DIAGNOSIS — Z881 Allergy status to other antibiotic agents status: Secondary | ICD-10-CM | POA: Insufficient documentation

## 2020-02-23 DIAGNOSIS — Z86718 Personal history of other venous thrombosis and embolism: Secondary | ICD-10-CM | POA: Insufficient documentation

## 2020-02-23 DIAGNOSIS — Z888 Allergy status to other drugs, medicaments and biological substances status: Secondary | ICD-10-CM | POA: Insufficient documentation

## 2020-02-23 DIAGNOSIS — N200 Calculus of kidney: Secondary | ICD-10-CM | POA: Insufficient documentation

## 2020-02-23 HISTORY — DX: Calculus of kidney: N20.0

## 2020-02-23 HISTORY — PX: CYSTOSCOPY WITH RETROGRADE PYELOGRAM, URETEROSCOPY AND STENT PLACEMENT: SHX5789

## 2020-02-23 SURGERY — CYSTOURETEROSCOPY, WITH RETROGRADE PYELOGRAM AND STENT INSERTION
Anesthesia: General | Site: Renal | Laterality: Left

## 2020-02-23 MED ORDER — CEFAZOLIN SODIUM-DEXTROSE 2-4 GM/100ML-% IV SOLN
2.0000 g | INTRAVENOUS | Status: AC
  Administered 2020-02-23: 2 g via INTRAVENOUS

## 2020-02-23 MED ORDER — LIDOCAINE HCL (PF) 2 % IJ SOLN
INTRAMUSCULAR | Status: AC
  Filled 2020-02-23: qty 5

## 2020-02-23 MED ORDER — ACETAMINOPHEN 500 MG PO TABS
1000.0000 mg | ORAL_TABLET | Freq: Once | ORAL | Status: AC
  Administered 2020-02-23: 1000 mg via ORAL

## 2020-02-23 MED ORDER — ONDANSETRON HCL 4 MG/2ML IJ SOLN
INTRAMUSCULAR | Status: DC | PRN
  Administered 2020-02-23: 4 mg via INTRAVENOUS

## 2020-02-23 MED ORDER — CEFAZOLIN SODIUM-DEXTROSE 2-4 GM/100ML-% IV SOLN
INTRAVENOUS | Status: AC
  Filled 2020-02-23: qty 100

## 2020-02-23 MED ORDER — DEXAMETHASONE SODIUM PHOSPHATE 10 MG/ML IJ SOLN
INTRAMUSCULAR | Status: DC | PRN
  Administered 2020-02-23: 5 mg via INTRAVENOUS

## 2020-02-23 MED ORDER — FENTANYL CITRATE (PF) 100 MCG/2ML IJ SOLN: 25.0000 ug | INTRAMUSCULAR | Status: DC | PRN

## 2020-02-23 MED ORDER — ACETAMINOPHEN 500 MG PO TABS
ORAL_TABLET | ORAL | Status: AC
  Filled 2020-02-23: qty 2

## 2020-02-23 MED ORDER — LACTATED RINGERS IV SOLN: INTRAVENOUS | Status: DC

## 2020-02-23 MED ORDER — SODIUM CHLORIDE 0.9 % IR SOLN
Status: DC | PRN
  Administered 2020-02-23: 3000 mL

## 2020-02-23 MED ORDER — LIDOCAINE 2% (20 MG/ML) 5 ML SYRINGE
INTRAMUSCULAR | Status: DC | PRN
  Administered 2020-02-23: 40 mg via INTRAVENOUS

## 2020-02-23 MED ORDER — FENTANYL CITRATE (PF) 100 MCG/2ML IJ SOLN
INTRAMUSCULAR | Status: AC
  Filled 2020-02-23: qty 2

## 2020-02-23 MED ORDER — KETOROLAC TROMETHAMINE 30 MG/ML IJ SOLN
INTRAMUSCULAR | Status: DC | PRN
  Administered 2020-02-23: 30 mg via INTRAVENOUS

## 2020-02-23 MED ORDER — KETOROLAC TROMETHAMINE 30 MG/ML IJ SOLN
INTRAMUSCULAR | Status: AC
  Filled 2020-02-23: qty 1

## 2020-02-23 MED ORDER — FENTANYL CITRATE (PF) 100 MCG/2ML IJ SOLN
INTRAMUSCULAR | Status: DC | PRN
  Administered 2020-02-23 (×2): 50 ug via INTRAVENOUS
  Administered 2020-02-23 (×4): 25 ug via INTRAVENOUS

## 2020-02-23 MED ORDER — PROPOFOL 10 MG/ML IV BOLUS
INTRAVENOUS | Status: AC
  Filled 2020-02-23: qty 20

## 2020-02-23 MED ORDER — ONDANSETRON HCL 4 MG/2ML IJ SOLN
INTRAMUSCULAR | Status: AC
  Filled 2020-02-23: qty 2

## 2020-02-23 MED ORDER — IOHEXOL 300 MG/ML  SOLN
INTRAMUSCULAR | Status: DC | PRN
  Administered 2020-02-23: 10 mL

## 2020-02-23 MED ORDER — DEXAMETHASONE SODIUM PHOSPHATE 10 MG/ML IJ SOLN
INTRAMUSCULAR | Status: AC
  Filled 2020-02-23: qty 1

## 2020-02-23 MED ORDER — PROPOFOL 10 MG/ML IV BOLUS
INTRAVENOUS | Status: DC | PRN
  Administered 2020-02-23: 100 mg via INTRAVENOUS
  Administered 2020-02-23: 50 mg via INTRAVENOUS
  Administered 2020-02-23: 60 mg via INTRAVENOUS
  Administered 2020-02-23: 30 mg via INTRAVENOUS

## 2020-02-23 MED ORDER — SULFAMETHOXAZOLE-TRIMETHOPRIM 800-160 MG PO TABS: 1.0000 | ORAL_TABLET | Freq: Two times a day (BID) | ORAL | 0 refills | Status: DC

## 2020-02-23 SURGICAL SUPPLY — 39 items
BAG DRAIN URO-CYSTO SKYTR STRL (DRAIN) ×3 IMPLANT
BAG DRN UROCATH (DRAIN) ×1
BASKET STONE 1.7 NGAGE (UROLOGICAL SUPPLIES) ×3 IMPLANT
BASKET ZERO TIP NITINOL 2.4FR (BASKET) IMPLANT
BSKT STON RTRVL ZERO TP 2.4FR (BASKET)
BULB IRRIG PATHFIND (MISCELLANEOUS) ×3 IMPLANT
CATH INTERMIT  6FR 70CM (CATHETERS) ×3 IMPLANT
CLOTH BEACON ORANGE TIMEOUT ST (SAFETY) ×3 IMPLANT
DRSG TEGADERM 2-3/8X2-3/4 SM (GAUZE/BANDAGES/DRESSINGS) ×3 IMPLANT
ELECT REM PT RETURN 9FT ADLT (ELECTROSURGICAL)
ELECTRODE REM PT RTRN 9FT ADLT (ELECTROSURGICAL) IMPLANT
EXTRACTOR STONE 1.7FRX115CM (UROLOGICAL SUPPLIES) ×6 IMPLANT
FIBER LASER FLEXIVA 365 (UROLOGICAL SUPPLIES) IMPLANT
GLOVE BIO SURGEON STRL SZ8 (GLOVE) ×3 IMPLANT
GLOVE BIOGEL M 6.5 STRL (GLOVE) ×3 IMPLANT
GLOVE BIOGEL PI IND STRL 7.0 (GLOVE) ×2 IMPLANT
GLOVE BIOGEL PI INDICATOR 7.0 (GLOVE) ×4
GOWN STRL REUS W/ TWL LRG LVL3 (GOWN DISPOSABLE) ×1 IMPLANT
GOWN STRL REUS W/ TWL XL LVL3 (GOWN DISPOSABLE) ×1 IMPLANT
GOWN STRL REUS W/TWL LRG LVL3 (GOWN DISPOSABLE) ×3
GOWN STRL REUS W/TWL XL LVL3 (GOWN DISPOSABLE) ×3
GUIDEWIRE ANG ZIPWIRE 038X150 (WIRE) IMPLANT
GUIDEWIRE STR DUAL SENSOR (WIRE) IMPLANT
GUIDEWIRE ZIPWRE .038 STRAIGHT (WIRE) ×3 IMPLANT
IV NS IRRIG 3000ML ARTHROMATIC (IV SOLUTION) ×3 IMPLANT
KIT TURNOVER CYSTO (KITS) ×3 IMPLANT
MANIFOLD NEPTUNE II (INSTRUMENTS) ×3 IMPLANT
NS IRRIG 500ML POUR BTL (IV SOLUTION) ×3 IMPLANT
PACK CYSTO (CUSTOM PROCEDURE TRAY) ×3 IMPLANT
SCOPE LITHOVU DISP 9.5FR 7.7FR (UROLOGICAL SUPPLIES) ×1 IMPLANT
SCOPE LITHOVUE DISPOSABLE (UROLOGICAL SUPPLIES) ×3
SHEATH URET ACCESS 12FR/35CM (UROLOGICAL SUPPLIES) ×3 IMPLANT
SHEATH URETERAL 12FRX35CM (MISCELLANEOUS) IMPLANT
STENT URET 6FRX24 CONTOUR (STENTS) ×3 IMPLANT
TRACTIP FLEXIVA PULS ID 200XHI (Laser) ×1 IMPLANT
TRACTIP FLEXIVA PULSE ID 200 (Laser) ×3
TUBE CONNECTING 12'X1/4 (SUCTIONS)
TUBE CONNECTING 12X1/4 (SUCTIONS) IMPLANT
TUBING UROLOGY SET (TUBING) ×3 IMPLANT

## 2020-02-23 NOTE — Op Note (Signed)
Preoperative diagnosis: Left lower pole renal calculi  Postoperative diagnosis: Same  Principal procedure: Cystoscopy, left retrograde ureteropyelogram, fluoroscopic interpretation, left ureteroscopy, holmium laser lithotripsy and extraction of multiple left lower pole calculi, placement of 6 French by 24 cm contour double-J stent with tether  Surgeon: Numa Heatwole  Anesthesia: General with LMA  Complications: None  Drains: 24 cm 6 French contour double-J stent with tether  Specimens: Stone fragments  Estimated blood loss: Less than 25 mL  Indications: 74 year old male status post shockwave lithotripsy of a large left renal pelvic stone earlier this year.  The patient did pass quite a few fragments, but on follow-up he has multiple small fragments in his lower pole calyces.  As he is a stone former, I worried that these could eventually grow and perform significant stone burden which would necessitate more aggressive management in the future.  I have counseled the patient that we should try to get rid of all the stone burden in his left kidney to prevent a more extensive procedure in the future.  I discussed ureteroscopy with laser, stent placement, as well as risks and complications and expected outcome.  He understands and desires to proceed.  Findings: Urethra was normal, prostate nonobstructive.  Urothelium of the bladder was normal.  Ureteral orifice ease were normal in configuration and location.  Upon study of the left ureter with retrograde pyelography using Omnipaque, the ureter was patent throughout, with normal caliber, without stricture or hydronephrosis.  Pyelocalyceal system was normal.  Filling defects in the lower pole calyx consistent with stone burden.  Description of procedure: The patient was properly identified and marked in the holding area.  He received preoperative IV antibiotics.  Was taken to the operating room where general anesthetic was administered with LMA.  He was  placed in the dorsolithotomy position.  Genitalia and perineum were prepped and draped, proper timeout performed.  21 French panendoscope was advanced into the bladder with the above-mentioned findings.  Retrograde ureteropyelogram was performed using a 6 [place] open-ended catheter and Omnipaque.  The ureter and pyelocalyceal system appeared normal.  I negotiated a sensor tip guidewire through the open-ended catheter into the upper pole calyceal system.  Cystoscope was removed.  I dilated the ureter first with the inner core of the 12/14 ureteral access catheter, medium length.  Following this, the entire access catheter was placed without difficulty.  I then placed a zip wire as a safety wire.  With the outer core the access sheath in place, a [employer] disposable ureteroscope was utilized to negotiate the entire pyelocalyceal system.  A few Randall's plaques were noted in calyces/papillae, but the only stone burden was in a lower pole calyx with multiple larger and smaller fragments noted.  I utilized a 200 m fiber to deliver holmium laser energy at a frequency of 50 Hz and a power of 0.5 J.  Multiple smaller fragments were then produced.  I then removed the laser fiber, and utilizing the engage basket, all the fragments produced were extracted.  Some tiny fragments were too small to be grasped.  I used pulsed hydraulic energy to help loosen some of the stones from the urothelium, this assisted in making them available to remove.  Once all stone burden was removed from the lower pole calyx, the pelvis and the other calyces were inspected carefully, no other stones were seen.  At this point the ureteroscope was removed.  The access catheter was also removed.  The zip wire was backloaded through the cystoscope  and I then passed a 24 cm x 6 French contour double-J stent with a tether left on.  Once adequately positioned, it was deployed with the guidewire being removed with excellent proximal and distal  curl seen using fluoroscopy and cystoscopy, respectively.  The bladder was drained, the scope removed.  The tether was brought through the urethra, tied in a knot outside the meatus, trimmed, and taped to the patient's penis.  This point the procedure was terminated.  The patient was awakened, taken to the PACU in stable condition.  He tolerated the procedure well.

## 2020-02-23 NOTE — Interval H&P Note (Signed)
History and Physical Interval Note:  02/23/2020 8:19 AM  Andrew Moran  has presented today for surgery, with the diagnosis of LEFT RENAL CALCULI.  The various methods of treatment have been discussed with the patient and family. After consideration of risks, benefits and other options for treatment, the patient has consented to  Procedure(s) with comments: CYSTOSCOPY WITH RETROGRADE PYELOGRAM, URETEROSCOPY AND STENT PLACEMENT (Left) - 1HR AND 45 MINS as a surgical intervention.  The patient's history has been reviewed, patient examined, no change in status, stable for surgery.  I have reviewed the patient's chart and labs.  Questions were answered to the patient's satisfaction.     [name]

## 2020-02-23 NOTE — Discharge Instructions (Signed)
1. You may see some blood in the urine and may have some burning with urination for 48-72 hours. You also may notice that you have to urinate more frequently or urgently after your procedure which is normal.  2. You should call should you develop an inability urinate, fever > 101, persistent nausea and vomiting that prevents you from eating or drinking to stay hydrated.  3. If you have a stent, you will likely urinate more frequently and urgently until the stent is removed and you may experience some discomfort/pain in the lower abdomen and flank especially when urinating. You may take pain medication prescribed to you if needed for pain. You may also intermittently have blood in the urine until the stent is removed.  It is okay to pull the thread to remove the stent next Wednesday morning   Post Anesthesia Home Care Instructions  Activity: Get plenty of rest for the remainder of the day. A responsible individual must stay with you for 24 hours following the procedure.  For the next 24 hours, DO NOT: -Drive a car -Paediatric nurse -Drink alcoholic beverages -Take any medication unless instructed by your physician -Make any legal decisions or sign important papers.  Meals: Start with liquid foods such as gelatin or soup. Progress to regular foods as tolerated. Avoid greasy, spicy, heavy foods. If nausea and/or vomiting occur, drink only clear liquids until the nausea and/or vomiting subsides. Call your physician if vomiting continues.  Special Instructions/Symptoms: Your throat may feel dry or sore from the anesthesia or the breathing tube placed in your throat during surgery. If this causes discomfort, gargle with warm salt water. The discomfort should disappear within 24 hours.  You may take your next dose of Tylenol after 1:30 PM today if needed.

## 2020-02-23 NOTE — Anesthesia Postprocedure Evaluation (Signed)
Anesthesia Post Note  Patient: Andrew Moran  Procedure(s) Performed: CYSTOSCOPY WITH RETROGRADE PYELOGRAM, URETEROSCOPY, LITHOTRIPSY, STONE EXTRACTION,  AND STENT PLACEMENT (Left Renal)     Patient location during evaluation: PACU Anesthesia Type: General Level of consciousness: awake and alert Pain management: pain level controlled Vital Signs Assessment: post-procedure vital signs reviewed and stable Respiratory status: spontaneous breathing, nonlabored ventilation, respiratory function stable and patient connected to nasal cannula oxygen Cardiovascular status: blood pressure returned to baseline and stable Postop Assessment: no apparent nausea or vomiting Anesthetic complications: no   No complications documented.  Last Vitals:  Vitals:   02/23/20 1130 02/23/20 1200  BP: 115/83 120/88  Pulse: 64 63  Resp: 13 16  Temp: 36.7 C 36.7 C  SpO2: 98% 99%    Last Pain:  Vitals:   02/23/20 1200  TempSrc:   PainSc: 0-No pain                 Meliton Samad L Katurah Karapetian

## 2020-02-23 NOTE — Transfer of Care (Signed)
Immediate Anesthesia Transfer of Care Note  Patient: Andrew Moran  Procedure(s) Performed: Procedure(s) (LRB): CYSTOSCOPY WITH RETROGRADE PYELOGRAM, URETEROSCOPY, LITHOTRIPSY, STONE EXTRACTION,  AND STENT PLACEMENT (Left)  Patient Location: PACU  Anesthesia Type: General  Level of Consciousness: awake, oriented, sedated and patient cooperative  Airway & Oxygen Therapy: Patient Spontanous Breathing and Patient connected to face mask oxygen  Post-op Assessment: Report given to PACU RN and Post -op Vital signs reviewed and stable  Post vital signs: Reviewed and stable  Complications: No apparent anesthesia complications  Last Vitals:  Vitals Value Taken Time  BP 142/76 02/23/20 1045  Temp    Pulse 79 02/23/20 1046  Resp 19 02/23/20 1046  SpO2 99 % 02/23/20 1046  Vitals shown include unvalidated device data.  Last Pain:  Vitals:   02/23/20 0715  TempSrc: Oral  PainSc: 0-No pain      Patients Stated Pain Goal: 7 (84/78/41 2820)  Complications: No complications documented.

## 2020-02-23 NOTE — Anesthesia Preprocedure Evaluation (Addendum)
Anesthesia Evaluation  Patient identified by MRN, date of birth, ID band Patient awake    Reviewed: Allergy & Precautions, NPO status , Patient's Chart, lab work & pertinent test results  Airway Mallampati: II  TM Distance: >3 FB Neck ROM: Full    Dental  (+) Teeth Intact, Dental Advisory Given   Pulmonary asthma ,    Pulmonary exam normal breath sounds clear to auscultation       Cardiovascular + DVT  Normal cardiovascular exam Rhythm:Regular Rate:Normal  HLD  TTE 2008 unremarkable   Neuro/Psych negative neurological ROS  negative psych ROS   GI/Hepatic Neg liver ROS, GERD  Controlled,  Endo/Other  negative endocrine ROS  Renal/GU negative Renal ROS  negative genitourinary   Musculoskeletal  (+) Arthritis ,   Abdominal   Peds  Hematology negative hematology ROS (+)   Anesthesia Other Findings Left renal stone  Reproductive/Obstetrics                            Anesthesia Physical Anesthesia Plan  ASA: II  Anesthesia Plan: General   Post-op Pain Management:    Induction: Intravenous  PONV Risk Score and Plan: 2 and Ondansetron and Dexamethasone  Airway Management Planned: LMA  Additional Equipment:   Intra-op Plan:   Post-operative Plan: Extubation in OR  Informed Consent: I have reviewed the patients History and Physical, chart, labs and discussed the procedure including the risks, benefits and alternatives for the proposed anesthesia with the patient or authorized representative who has indicated his/her understanding and acceptance.     Dental advisory given  Plan Discussed with: CRNA  Anesthesia Plan Comments:         Anesthesia Quick Evaluation

## 2020-02-23 NOTE — Anesthesia Procedure Notes (Signed)
Procedure Name: LMA Insertion Date/Time: 02/23/2020 8:54 AM Performed by: Suan Halter, CRNA Pre-anesthesia Checklist: Patient identified, Emergency Drugs available, Suction available and Patient being monitored Patient Re-evaluated:Patient Re-evaluated prior to induction Oxygen Delivery Method: Circle system utilized Preoxygenation: Pre-oxygenation with 100% oxygen Induction Type: IV induction Ventilation: Mask ventilation without difficulty LMA: LMA inserted LMA Size: 4.0 Number of attempts: 1 Airway Equipment and Method: Bite block Placement Confirmation: positive ETCO2 Tube secured with: Tape Dental Injury: Teeth and Oropharynx as per pre-operative assessment

## 2020-02-24 ENCOUNTER — Encounter (HOSPITAL_BASED_OUTPATIENT_CLINIC_OR_DEPARTMENT_OTHER): Payer: Self-pay | Admitting: Urology

## 2020-03-15 ENCOUNTER — Telehealth: Payer: Self-pay | Admitting: Internal Medicine

## 2020-03-15 ENCOUNTER — Ambulatory Visit: Payer: Self-pay

## 2020-03-15 NOTE — Telephone Encounter (Signed)
Spoke with patient. He is agreeable to the infusion. He stated that he had checked his fever a few more times since the first call and it is still above 100F. I advised him that I would send a message to the infusion group to see if they can get in contact with him today. He verbalized understanding.   Message has been sent.

## 2020-03-15 NOTE — Telephone Encounter (Signed)
Needs to contact the Monoclonal antibody service - NP's know how if needed   Not additional recs x as per last avs  Re prilosec/pepcid/diet

## 2020-03-15 NOTE — Telephone Encounter (Signed)
Pt. Started having COVID 19 symptoms Monday. Positive home test. Has cough, congestion, shortness of breath. Instructed to call his PCP. Given Infusion Hotline number as well.  Reason for Disposition . MILD difficulty breathing (e.g., minimal/no SOB at rest, SOB with walking, pulse <100)  Answer Assessment - Initial Assessment Questions 1. COVID-19 DIAGNOSIS: "Who made your COVID-19 diagnosis?" "Was it confirmed by a positive lab test?" If not diagnosed by a HCP, ask "Are there lots of cases (community spread) where you live?" Note: See public health department website, if unsure.     Home test 2. COVID-19 EXPOSURE: "Was there any known exposure to COVID before the symptoms began?" CDC Definition of close contact: within 6 feet (2 meters) for a total of 15 minutes or more over a 24-hour period.      No 3. ONSET: "When did the COVID-19 symptoms start?"      Monday 4. WORST SYMPTOM: "What is your worst symptom?" (e.g., cough, fever, shortness of breath, muscle aches)     Cough, shortness of breath 5. COUGH: "Do you have a cough?" If Yes, ask: "How bad is the cough?"       Yes 6. FEVER: "Do you have a fever?" If Yes, ask: "What is your temperature, how was it measured, and when did it start?"     98.4 7. RESPIRATORY STATUS: "Describe your breathing?" (e.g., shortness of breath, wheezing, unable to speak)      No 8. BETTER-SAME-WORSE: "Are you getting better, staying the same or getting worse compared to yesterday?"  If getting worse, ask, "In what way?"     Worse 9. HIGH RISK DISEASE: "Do you have any chronic medical problems?" (e.g., asthma, heart or lung disease, weak immune system, obesity, etc.)     Asthma 10. VACCINE: "Have you gotten the COVID-19 vaccine?" If Yes ask: "Which one, how many shots, when did you get it?"       Yes 11. PREGNANCY: "Is there any chance you are pregnant?" "When was your last menstrual period?"       n/a 12. OTHER SYMPTOMS: "Do you have any other symptoms?"   (e.g., chills, fatigue, headache, loss of smell or taste, muscle pain, sore throat; new loss of smell or taste especially support the diagnosis of COVID-19)       Headache, cough, congestion  Protocols used: CORONAVIRUS (COVID-19) DIAGNOSED OR SUSPECTED-A-AH

## 2020-03-15 NOTE — Telephone Encounter (Signed)
Spoke with the pt  He is c/o cough and congestion x 4 days  2 days ago started having HA, rhinits, sneezing and pain between his shoulders  He states he feels SOB also  No wheezing or sputum production   His temp while on the phone with me was 100.7  He states that he took at home antigen test last night and it was positive  He has had covid vax x 2, no booster   He has not tried omeprazole and pepcid yet since onset of cough   Last ov AVS:  Instructions  At the onset of any cough/ throat clearing hoarseness  Add back :  Try prilosec otc 20mg   Take 30-60 min before first meal of the day and Pepcid ac (famotidine) 20 mg one after supper until better  for at least a week     GERD (REFLUX)  is an extremely common cause of respiratory symptoms just like yours , many times with no obvious heartburn at all.     It can be treated with medication, but also with lifestyle changes including elevation of the head of your bed (ideally with 6 -8inch blocks under the headboard of your bed),  Smoking cessation, avoidance of late meals, excessive alcohol, and avoid fatty foods, chocolate, peppermint, colas, red wine, and acidic juices such as orange juice.  NO MINT OR MENTHOL PRODUCTS SO NO COUGH DROPS  USE SUGARLESS CANDY INSTEAD (Jolley ranchers or Stover's or Life Savers) or even ice chips will also do - the key is to swallow to prevent all throat clearing. NO OIL BASED VITAMINS - use powdered substitutes.  Avoid fish oil when coughing.     Please schedule a follow up visit in 6 months but call sooner if needed

## 2020-03-22 ENCOUNTER — Telehealth: Payer: Self-pay | Admitting: Internal Medicine

## 2020-03-22 NOTE — Telephone Encounter (Signed)
Spoke with the pt and notified of response per Dr Sherene Sires and he verbalized understanding and nothing further needed.

## 2020-03-22 NOTE — Telephone Encounter (Signed)
They get over 500 calls a day and only have a few doses of the antibody to give to the sickest patients so it's good he did not apparently "make the cut"  - should just monitor sats and go to er if dropping less than 90% and staying there

## 2020-03-22 NOTE — Telephone Encounter (Signed)
Called and spoke with pt and he stated that he never did hear back from anyone at the infusion clinic.  He stated that he called over and never got a call back.  He did say that [facility] has been calling him daily to check on him but he stated that he felt that he needed the infusion.  Will forward to MW to make him aware.

## 2020-03-29 DIAGNOSIS — Z20822 Contact with and (suspected) exposure to covid-19: Secondary | ICD-10-CM | POA: Diagnosis not present

## 2020-04-09 DIAGNOSIS — N200 Calculus of kidney: Secondary | ICD-10-CM | POA: Diagnosis not present

## 2020-04-11 DIAGNOSIS — H25013 Cortical age-related cataract, bilateral: Secondary | ICD-10-CM | POA: Diagnosis not present

## 2020-04-11 DIAGNOSIS — H2513 Age-related nuclear cataract, bilateral: Secondary | ICD-10-CM | POA: Diagnosis not present

## 2020-04-11 DIAGNOSIS — H35363 Drusen (degenerative) of macula, bilateral: Secondary | ICD-10-CM | POA: Diagnosis not present

## 2020-04-11 DIAGNOSIS — H524 Presbyopia: Secondary | ICD-10-CM | POA: Diagnosis not present

## 2020-04-17 DIAGNOSIS — R001 Bradycardia, unspecified: Secondary | ICD-10-CM | POA: Diagnosis not present

## 2020-04-17 DIAGNOSIS — R42 Dizziness and giddiness: Secondary | ICD-10-CM | POA: Diagnosis not present

## 2020-04-24 ENCOUNTER — Telehealth: Payer: Self-pay

## 2020-04-24 NOTE — Telephone Encounter (Signed)
Received a message from scheduler.Patient's PCP referred patient to see Dr.Acharya.Spoke to patient he will keep appointment already scheduled with Dr.Acharya 04/27/20 at 4:00 pm.

## 2020-04-27 ENCOUNTER — Other Ambulatory Visit: Payer: Self-pay

## 2020-04-27 ENCOUNTER — Encounter: Payer: Self-pay | Admitting: Internal Medicine

## 2020-04-27 ENCOUNTER — Ambulatory Visit: Payer: Medicare Other | Admitting: Internal Medicine

## 2020-04-27 VITALS — BP 131/64 | HR 64 | Ht 65.0 in | Wt 175.4 lb

## 2020-04-27 DIAGNOSIS — R42 Dizziness and giddiness: Secondary | ICD-10-CM

## 2020-04-27 DIAGNOSIS — R0600 Dyspnea, unspecified: Secondary | ICD-10-CM | POA: Diagnosis not present

## 2020-04-27 DIAGNOSIS — R072 Precordial pain: Secondary | ICD-10-CM

## 2020-04-27 NOTE — Patient Instructions (Signed)
Medication Instructions:  No Changes In Medications at this time.  *If you need a refill on your cardiac medications before your next appointment, please call your pharmacy*  Testing/Procedures: Your physician has requested that you have an echocardiogram. Echocardiography is a painless test that uses sound waves to create images of your heart. It provides your doctor with information about the size and shape of your heart and how well your heart's chambers and valves are working. You may receive an ultrasound enhancing agent through an IV if needed to better visualize your heart during the echo.This procedure takes approximately one hour. There are no restrictions for this procedure. This will take place at the [address], [address].   Your physician has requested that you have a lexiscan myoview. For further information please visit [email or web address]. Please follow instruction sheet, as given.  The test will take approximately 3 to 4 hours to complete; you may bring reading material.  If someone comes with you to your appointment, they will need to remain in the main lobby due to limited space in the testing area.    How to prepare for your Myocardial Perfusion Test:  Do not eat or drink 3 hours prior to your test, except you may have water.  Do not consume products containing caffeine (regular or decaffeinated) 12 hours prior to your test. (ex: coffee, chocolate, sodas, tea).  Do wear comfortable clothes (no dresses or overalls) and walking shoes, tennis shoes preferred (No heels or open toe shoes are allowed).  Do NOT wear cologne, perfume, aftershave, or lotions (deodorant is allowed).  If you use an inhaler, use it the AM of your test and bring it with you.   If you use a nebulizer, use it the AM of your test.   If these instructions are not followed, your test will have to be rescheduled.  [employer] Cardiac Event Monitor Instructions Your physician has requested you wear  your cardiac event monitor for 14 days, (1-30). [employer] may call or text to confirm a shipping address. The monitor will be sent to a land address via UPS. [employer] will not ship a monitor to a PO BOX. It typically takes 3-5 days to receive your monitor after it has been enrolled. [employer] will assist with USPS tracking if your package is delayed. The telephone number for [employer] is [phone]. Once you have received your monitor, please review the enclosed instructions. Instruction tutorials can also be viewed under help and settings on the enclosed cell phone. Your monitor has already been registered assigning a specific monitor serial # to you.  Applying the monitor Remove cell phone from case and turn it on. The cell phone works as Dealer and needs to be within [employer] of you at all times. The cell phone will need to be charged on a daily basis. We recommend you plug the cell phone into the enclosed charger at your bedside table every night.  Monitor batteries: You will receive two monitor batteries labelled #1 and #2. These are your recorders. Plug battery #2 onto the second connection on the enclosed charger. Keep one battery on the charger at all times. This will keep the monitor battery deactivated. It will also keep it fully charged for when you need to switch your monitor batteries. A small light will be blinking on the battery emblem when it is charging. The light on the battery emblem will remain on when the battery is fully charged.  Open package of a Monitor  strip. Insert battery #1 into black hood on strip and gently squeeze monitor battery onto connection as indicated in instruction booklet. Set aside while preparing skin.  Choose location for your strip, vertical or horizontal, as indicated in the instruction booklet. Shave to remove all hair from location. There cannot be any lotions, oils, powders, or colognes on skin where monitor is to be applied.  Wipe skin clean with enclosed Saline wipe. Dry skin completely.  Peel paper labeled #1 off the back of the Monitor strip exposing the adhesive. Place the monitor on the chest in the vertical or horizontal position shown in the instruction booklet. One arrow on the monitor strip must be pointing upward. Carefully remove paper labeled #2, attaching remainder of strip to your skin. Try not to create any folds or wrinkles in the strip as you apply it.  Firmly press and release the circle in the center of the monitor battery. You will hear a small beep. This is turning the monitor battery on. The heart emblem on the monitor battery will light up every 5 seconds if the monitor battery in turned on and connected to the patient securely. Do not push and hold the circle down as this turns the monitor battery off. The cell phone will locate the monitor battery. A screen will appear on the cell phone checking the connection of your monitor strip. This may read poor connection initially but change to good connection within the next minute. Once your monitor accepts the connection you will hear a series of 3 beeps followed by a climbing crescendo of beeps. A screen will appear on the cell phone showing the two monitor strip placement options. Touch the picture that demonstrates where you applied the monitor strip.  Your monitor strip and battery are waterproof. You are able to shower, bathe, or swim with the monitor on. They just ask you do not submerge deeper than 3 feet underwater. We recommend removing the monitor if you are swimming in a lake, river, or ocean.  Your monitor battery will need to be switched to a fully charged monitor battery approximately once a week. The cell phone will alert you of an action which needs to be made.  On the cell phone, tap for details to reveal connection status, monitor battery status, and cell phone battery status. The green dots indicates your monitor is in good  status. A red dot indicates there is something that needs your attention.  To record a symptom, click the circle on the monitor battery. In 30-60 seconds a list of symptoms will appear on the cell phone. Select your symptom and tap save. Your monitor will record a sustained or significant arrhythmia regardless of you clicking the button. Some patients do not feel the heart rhythm irregularities. [employer] will notify us of any serious or critical events.  Refer to instruction booklet for instructions on switching batteries, changing strips, the Do not disturb or Pause features, or any additional questions.  Call [employer] at [phone], to confirm your monitor is transmitting and record your baseline. They will answer any questions you may have regarding the monitor instructions at that time.  Returning the monitor to [place] all equipment back into blue box. Peel off strip of paper to expose adhesive and close box securely. There is a prepaid UPS shipping label on this box. Drop in a UPS drop box, or at a UPS facility like [employer]. You may also contact [employer] to arrange UPS to pick up monitor package at  your home.  Follow-Up: At [facility], you and your health needs are our priority.  As part of our continuing mission to provide you with exceptional heart care, we have created designated Provider Care Teams.  These Care Teams include your primary Cardiologist (physician) and Advanced Practice Providers (APPs -  Physician Assistants and Nurse Practitioners) who all work together to provide you with the care you need, when you need it.  Your next appointment:   AFTER TESTING IS COMPLETED   The format for your next appointment:   In Person  Provider:   Cherlynn Kaiser, MD

## 2020-04-27 NOTE — Progress Notes (Signed)
Cardiology Office Note:    Date:  04/27/2020   ID:  Andrew Moran, DOB [birth year], MRN [id number]  PCP:  London Pepper, MD  Cardiologist:  Elouise Munroe, MD  Electrophysiologist:  None   Referring MD: London Pepper, MD   Chief Complaint/Reason for Referral: Follow-up DVT, dyspnea, irregular heartbeat, dizziness  History of Present Illness:    Andrew Moran is a 75 y.o. male with a history of arthritis, asthma, diverticulitis, GERD, kidney stones, who presents for follow up of a LLE DVT. Provoked DVT noted on 06/27/19.    He and his wife had Covid around the holidays.  Since then they have been using a pulse oximeter to follow-up oxygen levels.  They have both returned to baseline.  He notes when he wears his pulse oximeter his heartbeats will record low.  If he was not checking them he would not know and is not symptomatic from these.  We discussed that he has evidence of heart rate variability on his ECG and this may be the source of inaccurate readings from pulse oximeter.  He does however have some dizziness which is not positional.  He will be sitting in a chair and noticed that everything is spinning, he will also have some unsteadiness with gait and it is unclear the etiology of this.  We discussed performing a cardiac monitor.  He has some dyspnea which she generally attributes to his asthma.  We have not performed cardiovascular testing before and it is unclear if this has a cardiac etiology such as diastolic dysfunction or abnormal pulmonary pressures.  I have offered echocardiogram today to further evaluate dyspnea.  While hiking over the summer, he began to have exertional chest tightness which improved with rest.  He had substernal chest discomfort provoked by exertion.  He has a family history of premature CAD his father had an MI at age 51 and a fatal MI at age 61.  He does have treated hyperlipidemia and we discussed dietary factors to modify this.  He is currently on  atorvastatin 20 mg daily.  The patient denies PND, orthopnea, or leg swelling. Denies cough, fever, chills. Denies nausea, vomiting. Denies syncope.  Past Medical History:  Diagnosis Date  . Arthritis   . Asthma    pulmology-- dr wert--- cough varient versus uacs  . GERD (gastroesophageal reflux disease)    per pt takes DGL supplement  . History of diverticulitis of colon 2002   managed medically , no surgical intervention  . History of DVT of lower extremity 06/27/2019   followed by dr g. Margaretann Loveless--  completed 3 months xarelto 07/ 2021  . History of kidney stones   . Renal calculi    left side    Past Surgical History:  Procedure Laterality Date  . ANKLE HARDWARE REMOVAL Right 2008  . CARPAL TUNNEL RELEASE Bilateral left 1983;  right 1984  . CYSTOSCOPY WITH RETROGRADE PYELOGRAM, URETEROSCOPY AND STENT PLACEMENT Left 02/23/2020   Procedure: CYSTOSCOPY WITH RETROGRADE PYELOGRAM, URETEROSCOPY, LITHOTRIPSY, STONE EXTRACTION,  AND STENT PLACEMENT;  Surgeon: Franchot Gallo, MD;  Location: [facility];  Service: Urology;  Laterality: Left;  . CYSTOSCOPY/RETROGRADE/URETEROSCOPY/STONE EXTRACTION WITH BASKET Bilateral 10/10/2013   Procedure: CYSTOSCOPY/URETEROSCOPY/STONE EXTRACTION WITH HOLMIUM LASER, bilateral retrograde, bilateral ureteral stents;  Surgeon: Jorja Loa, MD;  Location: [facility];  Service: Urology;  Laterality: Bilateral;  . EXPLORATORY LAPAROTOMY  1970   EXPLORATORY STOMACH SURGERY FOR TEAR IN STOMACH WALL  . EXTRACORPOREAL SHOCK WAVE LITHOTRIPSY Left 10/06/2019  Procedure: EXTRACORPOREAL SHOCK WAVE LITHOTRIPSY (ESWL);  Surgeon: Robley Fries, MD;  Location: [facility];  Service: Urology;  Laterality: Left;  . EXTRACORPOREAL SHOCK WAVE LITHOTRIPSY  multiple since 2002,  last one 02-28-2013  . HOLMIUM LASER APPLICATION Left 0/62/3762   Procedure: HOLMIUM LASER APPLICATION;  Surgeon: Jorja Loa, MD;  Location: [facility];   Service: Urology;  Laterality: Left;  . KNEE ARTHROSCOPY  02/05/2011   Procedure: ARTHROSCOPY KNEE;  Surgeon: Laurice Record [place];  Location: [facility];  Service: Orthopedics;  Laterality: Right;  Right knee arthroscopy partial lateral and medial meniscectomy with condyle debridement  . LUMBAR SPINE SURGERY  x2  1995  . ORIF ANKLE FRACTURE Right 1988  . ROTATOR CUFF REPAIR Bilateral right 02/16/2018;  left 1997  . TONSILLECTOMY AND ADENOIDECTOMY  1951  . TOTAL KNEE ARTHROPLASTY Left 04/08/2019   Procedure: TOTAL KNEE ARTHROPLASTY;  Surgeon: Sydnee Cabal, MD;  Location: [facility];  Service: Orthopedics;  Laterality: Left;  with adductor canal    Current Medications: Current Meds  Medication Sig  . albuterol (PROVENTIL HFA;VENTOLIN HFA) 108 (90 BASE) MCG/ACT inhaler Inhale 2 puffs into the lungs every 4 (four) hours as needed for wheezing or shortness of breath. Wheezing   . APPLE CIDER VINEGAR PO Take 5 mLs by mouth daily.  Marland Kitchen atorvastatin (LIPITOR) 20 MG tablet Take 20 mg by mouth at bedtime.  . budesonide-formoterol (SYMBICORT) 80-4.5 MCG/ACT inhaler Inhale 2 puffs into the lungs 2 (two) times daily. (Patient taking differently: Inhale 2 puffs into the lungs 2 (two) times daily.)  . Multiple Vitamins-Minerals (MULTIVITAMINS THER. W/MINERALS) TABS Take 1 tablet by mouth daily.  Marland Kitchen OVER THE COUNTER MEDICATION Take 2 tablets by mouth at bedtime. Deglycyrrhizinated-DGL  . [DISCONTINUED] methocarbamol (ROBAXIN) 500 MG tablet Take 1 tablet (500 mg total) by mouth 4 (four) times daily.     Allergies:   Zithromax [azithromycin] and Betadine [povidone iodine]   Social History   Tobacco Use  . Smoking status: Never Smoker  . Smokeless tobacco: Never Used  Vaping Use  . Vaping Use: Never used  Substance Use Topics  . Alcohol use: Yes    Comment: OCCASIONAL  . Drug use: No     Family History: The patient's family history includes Heart attack in his father.  ROS:   Please see the history of  present illness.    All other systems reviewed and are negative.  EKGs/Labs/Other Studies Reviewed:    The following studies were reviewed today:  EKG: Normal sinus rhythm with evidence of sinus arrhythmia  CT angio chest PE from 07/13/2013 reviewed.  Mid LAD coronary artery calcifications noted.  Recent Labs: No results found for requested labs within last 8760 hours.  Recent Lipid Panel No results found for: CHOL, TRIG, HDL, CHOLHDL, VLDL, LDLCALC, LDLDIRECT  Physical Exam:    VS:  BP 131/64   Pulse 64   Ht 5\' 5"  (1.651 m)   Wt 175 lb 6.4 oz (79.6 kg)   SpO2 100%   BMI 29.19 kg/m     Wt Readings from Last 5 Encounters:  04/27/20 175 lb 6.4 oz (79.6 kg)  02/23/20 171 lb 4.8 oz (77.7 kg)  02/20/20 172 lb 6.4 oz (78.2 kg)  10/06/19 172 lb (78 kg)  08/25/19 173 lb (78.5 kg)    Constitutional: No acute distress Eyes: sclera non-icteric, normal conjunctiva and lids ENMT: normal dentition, moist mucous membranes Cardiovascular: regular rhythm, normal rate, no murmurs. S1 and S2 normal. Radial pulses  normal bilaterally. No jugular venous distention.  Respiratory: clear to auscultation bilaterally GI : normal bowel sounds, soft and nontender. No distention.   MSK: extremities warm, well perfused. No edema.  NEURO: grossly nonfocal exam, moves all extremities. PSYCH: alert and oriented x 3, normal mood and affect.   ASSESSMENT:    1. Dyspnea, unspecified type   2. Precordial pain   3. Dizziness    PLAN:    Dyspnea, unspecified type - Plan: EKG 12-Lead, ECHOCARDIOGRAM COMPLETE -We will perform an echocardiogram since this has not been done previously to complete work-up for dyspnea.  He suspects it may be partially related to asthma which she is seeing pulmonary medicine for.  Would be particularly interested in diastolic function and RVSP on echocardiogram.  Precordial pain - Plan: EKG 12-Lead, ECHOCARDIOGRAM COMPLETE, MYOCARDIAL PERFUSION IMAGING -He had exertional  chest tightness over the summer and did not have his inhaler handy to trial if this is related to asthma.  He does have a family history of coronary artery disease and we discussed indications for ischemic testing today.  I have offered nuclear stress testing and they would like to proceed with this.  He has no active wheezing on exam and is likely safe for regadenoson administration.  Dizziness - Plan: EKG 12-Lead, Cardiac event monitor He is endorsing dizziness that is not positional and has noted some irregular pulse and low pulse on his pulse oximeter at home.  I have offered cardiac monitor to evaluate this further.  In the setting of recent COVID-19 infection we would be screening for atrial fibrillation and any rhythm disturbance.  Total time of encounter: 30 minutes total time of encounter, including 25 minutes spent in face-to-face patient care on the date of this encounter. This time includes coordination of care and counseling regarding above mentioned problem list. Remainder of non-face-to-face time involved reviewing chart documents/testing relevant to the patient encounter and documentation in the medical record. I have independently reviewed documentation from referring provider.   Cherlynn Kaiser, MD [place]  [facility]    Medication Adjustments/Labs and Tests Ordered: Current medicines are reviewed at length with the patient today.  Concerns regarding medicines are outlined above.   Orders Placed This Encounter  Procedures  . MYOCARDIAL PERFUSION IMAGING  . Cardiac event monitor  . EKG 12-Lead  . ECHOCARDIOGRAM COMPLETE    Shared Decision Making/Informed Consent:   Shared Decision Making/Informed Consent The risks [chest pain, shortness of breath, cardiac arrhythmias, dizziness, blood pressure fluctuations, myocardial infarction, stroke/transient ischemic attack, nausea, vomiting, allergic reaction, radiation exposure, metallic taste sensation and life-threatening  complications (estimated to be 1 in 10,000)], benefits (risk stratification, diagnosing coronary artery disease, treatment guidance) and alternatives of a nuclear stress test were discussed in detail with Andrew Moran and he agrees to proceed.   No orders of the defined types were placed in this encounter.   Patient Instructions  Medication Instructions:  No Changes In Medications at this time.  *If you need a refill on your cardiac medications before your next appointment, please call your pharmacy*  Testing/Procedures: Your physician has requested that you have an echocardiogram. Echocardiography is a painless test that uses sound waves to create images of your heart. It provides your doctor with information about the size and shape of your heart and how well your heart's chambers and valves are working. You may receive an ultrasound enhancing agent through an IV if needed to better visualize your heart during the echo.This procedure takes approximately one  hour. There are no restrictions for this procedure. This will take place at the [address], [address].   Your physician has requested that you have a lexiscan myoview. For further information please visit [email or web address]. Please follow instruction sheet, as given.  The test will take approximately 3 to 4 hours to complete; you may bring reading material.  If someone comes with you to your appointment, they will need to remain in the main lobby due to limited space in the testing area.    How to prepare for your Myocardial Perfusion Test:  Do not eat or drink 3 hours prior to your test, except you may have water.  Do not consume products containing caffeine (regular or decaffeinated) 12 hours prior to your test. (ex: coffee, chocolate, sodas, tea).  Do wear comfortable clothes (no dresses or overalls) and walking shoes, tennis shoes preferred (No heels or open toe shoes are allowed).  Do NOT wear cologne, perfume, aftershave,  or lotions (deodorant is allowed).  If you use an inhaler, use it the AM of your test and bring it with you.   If you use a nebulizer, use it the AM of your test.   If these instructions are not followed, your test will have to be rescheduled.  [employer] Cardiac Event Monitor Instructions Your physician has requested you wear your cardiac event monitor for 14 days, (1-30). [employer] may call or text to confirm a shipping address. The monitor will be sent to a land address via UPS. [employer] will not ship a monitor to a PO BOX. It typically takes 3-5 days to receive your monitor after it has been enrolled. [employer] will assist with USPS tracking if your package is delayed. The telephone number for [employer] is [phone]. Once you have received your monitor, please review the enclosed instructions. Instruction tutorials can also be viewed under help and settings on the enclosed cell phone. Your monitor has already been registered assigning a specific monitor serial # to you.  Applying the monitor Remove cell phone from case and turn it on. The cell phone works as Dealer and needs to be within [employer] of you at all times. The cell phone will need to be charged on a daily basis. We recommend you plug the cell phone into the enclosed charger at your bedside table every night.  Monitor batteries: You will receive two monitor batteries labelled #1 and #2. These are your recorders. Plug battery #2 onto the second connection on the enclosed charger. Keep one battery on the charger at all times. This will keep the monitor battery deactivated. It will also keep it fully charged for when you need to switch your monitor batteries. A small light will be blinking on the battery emblem when it is charging. The light on the battery emblem will remain on when the battery is fully charged.  Open package of a Monitor strip. Insert battery #1 into black hood on strip and gently squeeze  monitor battery onto connection as indicated in instruction booklet. Set aside while preparing skin.  Choose location for your strip, vertical or horizontal, as indicated in the instruction booklet. Shave to remove all hair from location. There cannot be any lotions, oils, powders, or colognes on skin where monitor is to be applied. Wipe skin clean with enclosed Saline wipe. Dry skin completely.  Peel paper labeled #1 off the back of the Monitor strip exposing the adhesive. Place the monitor on the chest in the vertical or horizontal  position shown in the instruction booklet. One arrow on the monitor strip must be pointing upward. Carefully remove paper labeled #2, attaching remainder of strip to your skin. Try not to create any folds or wrinkles in the strip as you apply it.  Firmly press and release the circle in the center of the monitor battery. You will hear a small beep. This is turning the monitor battery on. The heart emblem on the monitor battery will light up every 5 seconds if the monitor battery in turned on and connected to the patient securely. Do not push and hold the circle down as this turns the monitor battery off. The cell phone will locate the monitor battery. A screen will appear on the cell phone checking the connection of your monitor strip. This may read poor connection initially but change to good connection within the next minute. Once your monitor accepts the connection you will hear a series of 3 beeps followed by a climbing crescendo of beeps. A screen will appear on the cell phone showing the two monitor strip placement options. Touch the picture that demonstrates where you applied the monitor strip.  Your monitor strip and battery are waterproof. You are able to shower, bathe, or swim with the monitor on. They just ask you do not submerge deeper than 3 feet underwater. We recommend removing the monitor if you are swimming in a lake, river, or ocean.  Your  monitor battery will need to be switched to a fully charged monitor battery approximately once a week. The cell phone will alert you of an action which needs to be made.  On the cell phone, tap for details to reveal connection status, monitor battery status, and cell phone battery status. The green dots indicates your monitor is in good status. A red dot indicates there is something that needs your attention.  To record a symptom, click the circle on the monitor battery. In 30-60 seconds a list of symptoms will appear on the cell phone. Select your symptom and tap save. Your monitor will record a sustained or significant arrhythmia regardless of you clicking the button. Some patients do not feel the heart rhythm irregularities. [employer] will notify us of any serious or critical events.  Refer to instruction booklet for instructions on switching batteries, changing strips, the Do not disturb or Pause features, or any additional questions.  Call [employer] at [phone], to confirm your monitor is transmitting and record your baseline. They will answer any questions you may have regarding the monitor instructions at that time.  Returning the monitor to [place] all equipment back into blue box. Peel off strip of paper to expose adhesive and close box securely. There is a prepaid UPS shipping label on this box. Drop in a UPS drop box, or at a UPS facility like [employer]. You may also contact [employer] to arrange UPS to pick up monitor package at your home.  Follow-Up: At [facility], you and your health needs are our priority.  As part of our continuing mission to provide you with exceptional heart care, we have created designated Provider Care Teams.  These Care Teams include your primary Cardiologist (physician) and Advanced Practice Providers (APPs -  Physician Assistants and Nurse Practitioners) who all work together to provide you with the care you need, when you need  it.  Your next appointment:   AFTER TESTING IS COMPLETED   The format for your next appointment:   In Person  Provider:   Cherlynn Kaiser,  MD

## 2020-04-30 ENCOUNTER — Encounter: Payer: Self-pay | Admitting: Internal Medicine

## 2020-04-30 ENCOUNTER — Encounter: Payer: Self-pay | Admitting: *Deleted

## 2020-04-30 ENCOUNTER — Telehealth: Payer: Self-pay | Admitting: Internal Medicine

## 2020-04-30 NOTE — Progress Notes (Signed)
Patient ID: Andrew Moran, male   DOB: Nov 24, 1945, 75 y.o.   MRN: [id number] Patient enrolled for [employer] to ship a 14 day cardiac event monitor to his home.

## 2020-04-30 NOTE — Telephone Encounter (Signed)
Spoke with patient regarding echo. lexiscan myoview and follow up appointment.  Echo and myoview scheduled 05/29/20 at [facility] ---myoview scheduled at 10:45 am---echo at 2:00pm----follow up appointment with Dr. Margaretann Loveless scheduled Wednesday  05/30/20 at 4:00pm---will mail information to patient and he voiced his understanding.

## 2020-05-05 ENCOUNTER — Ambulatory Visit (INDEPENDENT_AMBULATORY_CARE_PROVIDER_SITE_OTHER): Payer: Medicare Other

## 2020-05-05 DIAGNOSIS — R42 Dizziness and giddiness: Secondary | ICD-10-CM

## 2020-05-05 DIAGNOSIS — R0600 Dyspnea, unspecified: Secondary | ICD-10-CM | POA: Diagnosis not present

## 2020-05-05 DIAGNOSIS — R072 Precordial pain: Secondary | ICD-10-CM

## 2020-05-16 NOTE — Addendum Note (Signed)
Addended by: Rexanne Mano B on: 05/16/2020 01:43 PM   Modules accepted: Orders

## 2020-05-16 NOTE — Addendum Note (Signed)
Addended by: Cherlynn Kaiser A on: 05/16/2020 01:48 PM   Modules accepted: Orders

## 2020-05-28 ENCOUNTER — Telehealth (HOSPITAL_COMMUNITY): Payer: Self-pay

## 2020-05-28 NOTE — Telephone Encounter (Signed)
Spoke with the patient's wife. She stated that she understood and would have him here for his test. Asked to call back with any questions. S.Williams EMTP

## 2020-05-29 ENCOUNTER — Other Ambulatory Visit: Payer: Self-pay

## 2020-05-29 ENCOUNTER — Ambulatory Visit (HOSPITAL_BASED_OUTPATIENT_CLINIC_OR_DEPARTMENT_OTHER): Payer: Medicare Other

## 2020-05-29 ENCOUNTER — Ambulatory Visit (HOSPITAL_COMMUNITY): Payer: Medicare Other | Attending: Cardiovascular Disease

## 2020-05-29 DIAGNOSIS — R072 Precordial pain: Secondary | ICD-10-CM

## 2020-05-29 DIAGNOSIS — R0600 Dyspnea, unspecified: Secondary | ICD-10-CM | POA: Diagnosis not present

## 2020-05-29 LAB — MYOCARDIAL PERFUSION IMAGING
LV dias vol: 69 mL (ref 62–150)
LV sys vol: 29 mL
Peak HR: 91 {beats}/min
Rest HR: 63 {beats}/min
SDS: 4
SRS: 0
SSS: 4
TID: 1.06

## 2020-05-29 LAB — ECHOCARDIOGRAM COMPLETE
Area-P 1/2: 2.8 cm2
Height: 65 in
S' Lateral: 2.2 cm
Weight: 2800 oz

## 2020-05-29 MED ORDER — TECHNETIUM TC 99M TETROFOSMIN IV KIT
11.0000 | PACK | Freq: Once | INTRAVENOUS | Status: AC | PRN
  Administered 2020-05-29: 11 via INTRAVENOUS
  Filled 2020-05-29: qty 11

## 2020-05-29 MED ORDER — TECHNETIUM TC 99M TETROFOSMIN IV KIT
30.9000 | PACK | Freq: Once | INTRAVENOUS | Status: AC | PRN
  Administered 2020-05-29: 30.9 via INTRAVENOUS
  Filled 2020-05-29: qty 31

## 2020-05-29 MED ORDER — REGADENOSON 0.4 MG/5ML IV SOLN
0.4000 mg | Freq: Once | INTRAVENOUS | Status: AC
  Administered 2020-05-29: 0.4 mg via INTRAVENOUS

## 2020-05-30 ENCOUNTER — Encounter: Payer: Self-pay | Admitting: Internal Medicine

## 2020-05-30 ENCOUNTER — Ambulatory Visit: Payer: Medicare Other | Admitting: Internal Medicine

## 2020-05-30 ENCOUNTER — Ambulatory Visit (INDEPENDENT_AMBULATORY_CARE_PROVIDER_SITE_OTHER): Payer: Medicare Other | Admitting: Internal Medicine

## 2020-05-30 VITALS — BP 130/78 | HR 67 | Ht 65.0 in | Wt 176.4 lb

## 2020-05-30 DIAGNOSIS — E785 Hyperlipidemia, unspecified: Secondary | ICD-10-CM

## 2020-05-30 DIAGNOSIS — I82452 Acute embolism and thrombosis of left peroneal vein: Secondary | ICD-10-CM

## 2020-05-30 DIAGNOSIS — R0600 Dyspnea, unspecified: Secondary | ICD-10-CM

## 2020-05-30 DIAGNOSIS — R072 Precordial pain: Secondary | ICD-10-CM | POA: Diagnosis not present

## 2020-05-30 DIAGNOSIS — R42 Dizziness and giddiness: Secondary | ICD-10-CM

## 2020-05-30 NOTE — Progress Notes (Signed)
Cardiology Office Note:    Date:  05/30/2020   ID:  Andrew Moran, DOB [birth year], MRN [id number]  PCP:  London Pepper, MD  Cardiologist:  Elouise Munroe, MD  Electrophysiologist:  None   Referring MD: Elouise Munroe, MD   Chief Complaint/Reason for Referral: Follow up.   History of Present Illness:    Andrew Moran is a 75 y.o. male with a history of arthritis, asthma, diverticulitis, GERD, kidney stones, who presents for follow up of a LLE DVT. Provoked DVT noted on 06/27/19.    Wife [name] joins him today. She brings her family pound cake recipe to share with me.   Due to symptoms of dizziness, irregular heartbeats and dyspnea, we performed cardiac testing.   Cardiac monitor shows mild sinus bradycardia and infrequent ectopy. He notes he has lightheadedness with bradycardia. Discussed precautions with standing to avoid falls.  Nuclear study was normal. Echo showed mildly elevated RVSP. He snores but defers on sleep evaluation at this time.  We had discussed dietary modifications at last visit to address conservative measures to control LDL with diet.    Past Medical History:  Diagnosis Date  . Arthritis   . Asthma    pulmology-- dr wert--- cough varient versus uacs  . GERD (gastroesophageal reflux disease)    per pt takes DGL supplement  . History of diverticulitis of colon 2002   managed medically , no surgical intervention  . History of DVT of lower extremity 06/27/2019   followed by dr g. Margaretann Loveless--  completed 3 months xarelto 07/ 2021  . History of kidney stones   . Renal calculi    left side    Past Surgical History:  Procedure Laterality Date  . ANKLE HARDWARE REMOVAL Right 2008  . CARPAL TUNNEL RELEASE Bilateral left 1983;  right 1984  . CYSTOSCOPY WITH RETROGRADE PYELOGRAM, URETEROSCOPY AND STENT PLACEMENT Left 02/23/2020   Procedure: CYSTOSCOPY WITH RETROGRADE PYELOGRAM, URETEROSCOPY, LITHOTRIPSY, STONE EXTRACTION,  AND STENT PLACEMENT;   Surgeon: Franchot Gallo, MD;  Location: [facility];  Service: Urology;  Laterality: Left;  . CYSTOSCOPY/RETROGRADE/URETEROSCOPY/STONE EXTRACTION WITH BASKET Bilateral 10/10/2013   Procedure: CYSTOSCOPY/URETEROSCOPY/STONE EXTRACTION WITH HOLMIUM LASER, bilateral retrograde, bilateral ureteral stents;  Surgeon: Jorja Loa, MD;  Location: [facility];  Service: Urology;  Laterality: Bilateral;  . EXPLORATORY LAPAROTOMY  1970   EXPLORATORY STOMACH SURGERY FOR TEAR IN STOMACH WALL  . EXTRACORPOREAL SHOCK WAVE LITHOTRIPSY Left 10/06/2019   Procedure: EXTRACORPOREAL SHOCK WAVE LITHOTRIPSY (ESWL);  Surgeon: Robley Fries, MD;  Location: [facility];  Service: Urology;  Laterality: Left;  . EXTRACORPOREAL SHOCK WAVE LITHOTRIPSY  multiple since 2002,  last one 02-28-2013  . HOLMIUM LASER APPLICATION Left 6/54/6503   Procedure: HOLMIUM LASER APPLICATION;  Surgeon: Jorja Loa, MD;  Location: [facility];  Service: Urology;  Laterality: Left;  . KNEE ARTHROSCOPY  02/05/2011   Procedure: ARTHROSCOPY KNEE;  Surgeon: Laurice Record [place];  Location: [facility];  Service: Orthopedics;  Laterality: Right;  Right knee arthroscopy partial lateral and medial meniscectomy with condyle debridement  . LUMBAR SPINE SURGERY  x2  1995  . ORIF ANKLE FRACTURE Right 1988  . ROTATOR CUFF REPAIR Bilateral right 02/16/2018;  left 1997  . TONSILLECTOMY AND ADENOIDECTOMY  1951  . TOTAL KNEE ARTHROPLASTY Left 04/08/2019   Procedure: TOTAL KNEE ARTHROPLASTY;  Surgeon: Sydnee Cabal, MD;  Location: [facility];  Service: Orthopedics;  Laterality: Left;  with adductor canal    Current Medications: Current Meds  Medication Sig  . albuterol (PROVENTIL HFA;VENTOLIN HFA) 108 (90 BASE) MCG/ACT inhaler Inhale 2 puffs into the lungs every 4 (four) hours as needed for wheezing or shortness of breath. Wheezing   . APPLE CIDER VINEGAR PO Take 5 mLs by mouth daily.  Marland Kitchen atorvastatin (LIPITOR) 20 MG tablet  Take 20 mg by mouth at bedtime.  . budesonide-formoterol (SYMBICORT) 80-4.5 MCG/ACT inhaler Inhale 2 puffs into the lungs 2 (two) times daily.  . Multiple Vitamins-Minerals (MULTIVITAMINS THER. W/MINERALS) TABS Take 1 tablet by mouth daily.  Marland Kitchen OVER THE COUNTER MEDICATION Take 2 tablets by mouth at bedtime. Deglycyrrhizinated-DGL     Allergies:   Zithromax [azithromycin] and Betadine [povidone iodine]   Social History   Tobacco Use  . Smoking status: Never Smoker  . Smokeless tobacco: Never Used  Vaping Use  . Vaping Use: Never used  Substance Use Topics  . Alcohol use: Yes    Comment: OCCASIONAL  . Drug use: No     Family History: The patient's family history includes Heart attack in his father.  ROS:   Please see the history of present illness.    All other systems reviewed and are negative.  EKGs/Labs/Other Studies Reviewed:    The following studies were reviewed today:  EKG:  n/a  I have independently reviewed the images from Nuc stress.  Recent Labs: No results found for requested labs within last 8760 hours.  Recent Lipid Panel No results found for: CHOL, TRIG, HDL, CHOLHDL, VLDL, LDLCALC, LDLDIRECT  Physical Exam:    VS:  BP 130/78 (BP Location: Left Arm, Patient Position: Sitting)   Pulse 67   Ht 5\' 5"  (1.651 m)   Wt 176 lb 6.4 oz (80 kg)   SpO2 99%   BMI 29.35 kg/m     Wt Readings from Last 5 Encounters:  05/30/20 176 lb 6.4 oz (80 kg)  05/29/20 175 lb (79.4 kg)  04/27/20 175 lb 6.4 oz (79.6 kg)  02/23/20 171 lb 4.8 oz (77.7 kg)  02/20/20 172 lb 6.4 oz (78.2 kg)    Constitutional: No acute distress Eyes: sclera non-icteric, normal conjunctiva and lids ENMT: normal dentition, moist mucous membranes Cardiovascular: regular rhythm, normal rate, no murmurs. S1 and S2 normal. Radial pulses normal bilaterally. No jugular venous distention.  Respiratory: clear to auscultation bilaterally GI : normal bowel sounds, soft and nontender. No distention.    MSK: extremities warm, well perfused. No edema.  NEURO: grossly nonfocal exam, moves all extremities. PSYCH: alert and oriented x 3, normal mood and affect.   ASSESSMENT:    1. Precordial pain   2. Dyspnea, unspecified type   3. Dizziness   4. Acute deep vein thrombosis (DVT) of left peroneal vein (HCC)   5. Hyperlipidemia LDL goal <70    PLAN:    Precordial pain - normal nuclear stress test. Observe symptoms, ensure adequate BP control  Dyspnea, unspecified type - mildly elevated RVSP. Recommend sleep study. He will consider. Would repeat echo in 1 year for reevaluation of pulmonary pressure.  Dizziness - no worrisome findings on monitor. Some symptoms with bradycardia. Observe. No indications for pacing at this point.   Acute deep vein thrombosis (DVT) of left peroneal vein (HCC) - provoked, completed therapy. No recurrent symptoms. Discussed compression socks.   Hyperlipidemia LDL goal <70 - recommend decrease in dietary fats. He enjoys pound cake and ice cream, we talked about reducing this from daily to once weekly. Would repeat lipids in 6 mo. No strong indication for  change in therapy at this point, but if he wanted to be more aggressive in lipid control would consider coronary calcium scan, and/or increase atorvastatin to 40 mg daily.   Total time of encounter: 30 minutes total time of encounter, including 20 minutes spent in face-to-face patient care on the date of this encounter. This time includes coordination of care and counseling regarding above mentioned problem list. Remainder of non-face-to-face time involved reviewing chart documents/testing relevant to the patient encounter and documentation in the medical record. I have independently reviewed documentation from referring provider.   Cherlynn Kaiser, MD, [facility]    Medication Adjustments/Labs and Tests Ordered: Current medicines are reviewed at length with the patient today.   Concerns regarding medicines are outlined above.   Patient Instructions  Medication Instructions:  No Changes In Medications at this time.  *If you need a refill on your cardiac medications before your next appointment, please call your pharmacy*  Follow-Up: At [facility], you and your health needs are our priority.  As part of our continuing mission to provide you with exceptional heart care, we have created designated Provider Care Teams.  These Care Teams include your primary Cardiologist (physician) and Advanced Practice Providers (APPs -  Physician Assistants and Nurse Practitioners) who all work together to provide you with the care you need, when you need it.  Your next appointment:   6 month(s)  The format for your next appointment:   In Person  Provider:   Cherlynn Kaiser, MD

## 2020-05-30 NOTE — Patient Instructions (Signed)

## 2020-06-12 DIAGNOSIS — Z20822 Contact with and (suspected) exposure to covid-19: Secondary | ICD-10-CM | POA: Diagnosis not present

## 2020-06-12 DIAGNOSIS — Z03818 Encounter for observation for suspected exposure to other biological agents ruled out: Secondary | ICD-10-CM | POA: Diagnosis not present

## 2020-07-19 DIAGNOSIS — D225 Melanocytic nevi of trunk: Secondary | ICD-10-CM | POA: Diagnosis not present

## 2020-07-19 DIAGNOSIS — L821 Other seborrheic keratosis: Secondary | ICD-10-CM | POA: Diagnosis not present

## 2020-07-19 DIAGNOSIS — L570 Actinic keratosis: Secondary | ICD-10-CM | POA: Diagnosis not present

## 2020-07-19 DIAGNOSIS — D2272 Melanocytic nevi of left lower limb, including hip: Secondary | ICD-10-CM | POA: Diagnosis not present

## 2020-08-10 DIAGNOSIS — Z20822 Contact with and (suspected) exposure to covid-19: Secondary | ICD-10-CM | POA: Diagnosis not present

## 2020-08-10 DIAGNOSIS — Z03818 Encounter for observation for suspected exposure to other biological agents ruled out: Secondary | ICD-10-CM | POA: Diagnosis not present

## 2020-08-20 ENCOUNTER — Encounter: Payer: Self-pay | Admitting: Internal Medicine

## 2020-08-20 ENCOUNTER — Other Ambulatory Visit: Payer: Self-pay

## 2020-08-20 ENCOUNTER — Ambulatory Visit: Payer: Medicare Other | Admitting: Internal Medicine

## 2020-08-20 DIAGNOSIS — J45991 Cough variant asthma: Secondary | ICD-10-CM

## 2020-08-20 NOTE — Progress Notes (Signed)
Subjective:   Patient ID: Andrew Moran, male    DOB: 1946/03/02,   MRN: [id number]     Brief patient profile:  89 yowm never smoker dx as asthma as child growing up in [place] mostly in winter months very limited activity due to sob and summer better s limits improved in his 23s then recurred in his 68's on prn saba but in 2000's required daily treatment with advair but only helped a little and still needed freq saba and saw Dr Bernita Buffy with no resp to shots so self referred to pulmonary clinic 06/26/2016 for refractory cough /sob with nl spirometry at initial ov.     History of Present Illness  06/26/2016 1st [facility] office visit/ Andrew Moran   Chief Complaint  Patient presents with  . Advice Only    self referral for emphysema.  worsening sob, nonprod cough Xseveral months.   ? Chronic asthma seen here  By me and By Dr Joya Gaskins around 2006 but does not remember seeing [place] and whether this helped but now comes in with worsening dry cough / throat irritation and doe indolent onset gradually worse x 3 m esp p supper  But does ok sleeping at 45 degrees  s nasal symptoms at all some better p saba  rec Stop advair  Plan A = Automatic = symbicort 80 Take 2 puffs first thing in am and then another 2 puffs about 12 hours later.  Work on inhaler technique: Plan B = Backup Only use your albuterol as a rescue medication GERD diet   Add : Pantoprazole (protonix) 40 mg   Take  30-60 min before first meal of the day and Pepcid (famotidine)  20 mg one @  Bedtime     07/30/2016  f/u ov/Andrew Moran re:  Cough variant asthma vs uacs/ much better on gerd rx plus back on advair 250 p ran out of symbicort 80 Chief Complaint  Patient presents with  . Follow-up    Pt states breathing has improved some and he is coughing less. Voice still sounds worse.   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                 And continue the reflux medications  Plan B = Backup Only use your albuterol (Proair)  as a rescue medication        01/28/2017  f/u ov/Andrew Moran re:  Cough varant asthma on symb 80 2bid  Chief Complaint  Patient presents with  . Follow-up    Increased cough since the weather turned cold "tickle in throat". Cough is non prod. He uses his albuterol inhaler 2-3 x per wk on average.   was doing great on symb 80 2bid and gerd rx and no problems with exertion or sleeping or need for saba Then with onset cold weather gradually worse cough/ sob and started needing rescue by 8 h p symbicort each afternoon  Does fine overnight  rec Try symbicort 160 Take 2 puffs first thing in am and then another 2 puffs about 12 hours later.  If the symbicort 160 helps you with your winter  cough and reduces your need for albuterol in the afternoons then continue the 160 but call for prescription - if not, then just continue the 80 strength       11/09/2017  f/u ov/Andrew Moran re:  Cough variant with ? Component uacs/ doing better  on symb 160 than 80 Chief Complaint  Patient presents with  . Follow-up    Pt states he has been doing good since last visit. Pt states he did have some mild complaints of SOB x1week ago and had so use his rescue inhaler a couple of times and wonders if he ate something that didn't agree with him.  Dyspnea:  Not limited by breathing from desired activities   Cough: minimal Sleeping: ok / slt elevation hob SABA use: rarely  Not using ppi prn now s many flares  rec For itching/ sneezing/ runny nose > zyrtec or clariton/allegra only as needed  At the onset of any cough/ throat clearing immediately start back on protonix 40 mg Take 30-60 min before first meal of the day     05/10/2018  f/u  ov/Andrew Moran re: cough variant asthma on symb 160 x 2 hs /  1 or 2 in am "prn" Chief Complaint  Patient presents with  . Follow-up    Breathing is overall doing well. He has not had to use his albuterol inhaler.   Dyspnea:  Not limited by breathing from desired activities   Cough: no, just sometimes with talking  Sleeping: in a recliner due to shoulder  SABA use: not using saba  rec Plan A = Automatic = symbiocrt 160 up to 2 pffs every 12 hours  Plan B = Backup Only use your albuterol inhaler as a rescue medication    11/08/2018  f/u ov/Andrew Moran re:  Cough variant asthma ? Cough  Better / but breathing is not better  Chief Complaint  Patient presents with  . Follow-up    He has occ throat clearing- more in the mornings.   Dyspnea:  Walks the neighborhood x 20-25 min then around 7 pm starts having tightness nightly since last visit takes symb 160 x 2 pffs at hs only  Cough: in am's clearing the throat but does not wake up prematurely or produce much mucus at all now  Sleeping: in recliner x 30 degrees due to shoulders  SABA use: rarely  02: none rec Plan A = Automatic = symbicort 80 Take 2 puffs first thing in am and then another 2 puffs about 12 hours later - ok to take the pm dose 15 min before any late day (your 7pm) exercise Work on inhaler technique:  Plan B = Backup Only use your albuterol inhaler as a rescue medication    02/20/2020  f/u ov/Andrew Moran re: cough variant asthma / on symb 80 2bid / no longer on gerd rx Chief Complaint  Patient presents with  . Follow-up    non productive cough in mornings  Dyspnea:  Not limited by breathing from desired activities   Cough: slt raspy in am s much sputum production Sleeping: 30 degrees with reclining bed  SABA use: very rarely using  02: none  rec At the onset of any cough/ throat clearing hoarseness  Add back :  Try prilosec otc 20mg   Take 30-60 min before first meal of the day and Pepcid ac (famotidine) 20 mg one after supper until  better  for at least a week   GERD diet     08/20/2020  f/u ov/Andrew Moran  re: cough variant asthma Chief Complaint  Patient presents with  . Follow-up   Dyspnea:  Walking neighborhood and does fine x [place]  Cough: gone/ voice texture still an issue  Sleeping: 30 degree recliner  SABA use: non 02: none  Covid status:    vax x 3  And likey infected by omicron   No obvious day to day or daytime variability or assoc excess/ purulent sputum or mucus plugs or hemoptysis or cp or chest tightness, subjective wheeze or overt sinus or hb symptoms.   Sleeping as above  without nocturnal  or early am exacerbation  of respiratory  c/o's or need for noct saba. Also denies any obvious fluctuation of symptoms with weather or environmental changes or other aggravating or alleviating factors except as outlined above   No unusual exposure hx or h/o childhood pna  or knowledge of premature birth.  Current Allergies, Complete Past Medical History, Past Surgical History, Family History, and Social History were reviewed in [employer] record.  ROS  The following are not active complaints unless bolded Hoarseness, sore throat, dysphagia, dental problems, itching, sneezing,  nasal congestion or discharge of excess mucus or purulent secretions, ear ache,   fever, chills, sweats, unintended wt loss or wt gain, classically pleuritic or exertional cp,  orthopnea pnd or arm/hand swelling  or leg swelling, presyncope, palpitations, abdominal pain, anorexia, nausea, vomiting, diarrhea  or change in bowel habits or change in bladder habits, change in stools or change in urine, dysuria, hematuria,  rash, arthralgias, visual complaints, headache, numbness, weakness or ataxia or problems with walking or coordination,  change in mood or  memory.        Current Meds  Medication Sig  . albuterol (PROVENTIL HFA;VENTOLIN HFA) 108 (90 BASE) MCG/ACT inhaler Inhale 2 puffs into the lungs every 4 (four)  hours as needed for wheezing or shortness of breath. Wheezing   . APPLE CIDER VINEGAR PO Take 5 mLs by mouth daily.  Marland Kitchen atorvastatin (LIPITOR) 20 MG tablet Take 20 mg by mouth at bedtime.  . budesonide-formoterol (SYMBICORT) 80-4.5 MCG/ACT inhaler Inhale 2 puffs into the lungs 2 (two) times daily.  . Multiple Vitamins-Minerals (MULTIVITAMINS THER. W/MINERALS) TABS Take 1 tablet by mouth daily.  Marland Kitchen OVER THE COUNTER MEDICATION Take 2 tablets by mouth at bedtime. Deglycyrrhizinated-DGL               Objective:   Physical Exam      08/20/2020          183  02/20/2020        172   03/21/2019          184  05/10/2018        178  11/09/2017        183  05/11/2017        184  01/28/2017      180  10/29/2016        174   06/26/16 182 lb (82.6 kg)  10/10/13 181 lb (82.1 kg)  09/30/13 181 lb (82.1 kg)     Vital signs reviewed  08/20/2020  - Note at rest 02 sats  97 % on 97%  General appearance: amb slt hoarse wm nad   HEENT : pt wearing mask not removed for exam due to covid -19 concerns.    NECK :  without JVD/Nodes/TM/ nl carotid upstrokes bilaterally   LUNGS: no acc muscle use,  Nl contour chest which is clear to A and  P bilaterally without cough on insp or exp maneuvers   CV:  RRR  no s3 or murmur or increase in P2, and no edema   ABD:  soft and nontender with nl inspiratory excursion in the supine position. No bruits or organomegaly appreciated, bowel sounds nl  MS:  Nl gait/ ext warm without deformities, calf tenderness, cyanosis or clubbing No obvious joint restrictions   SKIN: warm and dry without lesions    NEURO:  alert, approp, nl sensorium with  no motor or cerebellar deficits apparent.       Assessment & Plan:

## 2020-08-20 NOTE — Assessment & Plan Note (Signed)
Onset in Childhood / recurred in his 76s Spirometry 06/26/2016  FEV1 2.35  (90%)  Ratio 70 with min curvature p am advair - FENO 06/26/2016  =   18 p am advair  - 06/26/2016   so try symb 80 2bid and stop advair - 10/29/2016    reduce symb back to 80 2bid and continue gerd x 3 months - 01/28/2017 flared on symb 80 with lots of upper airway symptoms  - FENO 01/28/2017  =   20 on symb 80 with flare of symptoms  so rec trial of 160 2bid   - Allergy profile 01/28/2017 >  Eos 0.4/  IgE  49  RAST pos dust /Cat/tree/ ragweed - FENO 05/10/2018  =   22 on symb 160 2 hs and prn daytime dosing  - maint on symb 80 2bid and no gerd rx as of 02/20/2020 > rec add back gerd if flare  All goals of chronic asthma control met including optimal function and elimination of symptoms with minimal need for rescue therapy on symbicort 80 2bid   Contingencies discussed in full including contacting this office immediately if not controlling the symptoms using the rule of two's.     >>> f/u yearly, sooner prn          Each maintenance medication was reviewed in detail including emphasizing most importantly the difference between maintenance and prns and under what circumstances the prns are to be triggered using an action plan format where appropriate.  Total time for H and P, chart review, counseling, reviewing hfa device(s) and generating customized AVS unique to this office visit / same day charting = 18mn

## 2020-08-20 NOTE — Patient Instructions (Signed)
No change in medications   Follow up in once year

## 2020-10-10 DIAGNOSIS — N200 Calculus of kidney: Secondary | ICD-10-CM | POA: Diagnosis not present

## 2020-12-12 DIAGNOSIS — Z23 Encounter for immunization: Secondary | ICD-10-CM | POA: Diagnosis not present

## 2021-02-28 ENCOUNTER — Other Ambulatory Visit: Payer: Self-pay | Admitting: Internal Medicine

## 2021-03-25 DIAGNOSIS — Z833 Family history of diabetes mellitus: Secondary | ICD-10-CM | POA: Diagnosis not present

## 2021-03-25 DIAGNOSIS — Z0000 Encounter for general adult medical examination without abnormal findings: Secondary | ICD-10-CM | POA: Diagnosis not present

## 2021-03-25 DIAGNOSIS — J45909 Unspecified asthma, uncomplicated: Secondary | ICD-10-CM | POA: Diagnosis not present

## 2021-03-25 DIAGNOSIS — Z87442 Personal history of urinary calculi: Secondary | ICD-10-CM | POA: Diagnosis not present

## 2021-03-25 DIAGNOSIS — E785 Hyperlipidemia, unspecified: Secondary | ICD-10-CM | POA: Diagnosis not present

## 2021-03-25 DIAGNOSIS — M109 Gout, unspecified: Secondary | ICD-10-CM | POA: Diagnosis not present

## 2021-04-22 ENCOUNTER — Ambulatory Visit (INDEPENDENT_AMBULATORY_CARE_PROVIDER_SITE_OTHER): Payer: Medicare Other

## 2021-04-22 ENCOUNTER — Other Ambulatory Visit: Payer: Self-pay

## 2021-04-22 ENCOUNTER — Ambulatory Visit: Payer: Medicare Other | Admitting: Adult Health

## 2021-04-22 ENCOUNTER — Encounter: Payer: Self-pay | Admitting: Adult Health

## 2021-04-22 VITALS — BP 118/80 | HR 94 | Temp 98.6°F | Ht 65.0 in | Wt 172.0 lb

## 2021-04-22 DIAGNOSIS — J4531 Mild persistent asthma with (acute) exacerbation: Secondary | ICD-10-CM

## 2021-04-22 DIAGNOSIS — J45991 Cough variant asthma: Secondary | ICD-10-CM

## 2021-04-22 DIAGNOSIS — J45901 Unspecified asthma with (acute) exacerbation: Secondary | ICD-10-CM | POA: Insufficient documentation

## 2021-04-22 DIAGNOSIS — R059 Cough, unspecified: Secondary | ICD-10-CM | POA: Diagnosis not present

## 2021-04-22 DIAGNOSIS — J45909 Unspecified asthma, uncomplicated: Secondary | ICD-10-CM | POA: Diagnosis not present

## 2021-04-22 MED ORDER — BENZONATATE 200 MG PO CAPS: 200.0000 mg | ORAL_CAPSULE | Freq: Three times a day (TID) | ORAL | 1 refills | Status: DC | PRN

## 2021-04-22 MED ORDER — PREDNISONE 20 MG PO TABS: 20.0000 mg | ORAL_TABLET | Freq: Every day | ORAL | 0 refills | Status: DC

## 2021-04-22 NOTE — Assessment & Plan Note (Signed)
Asthma flare -check chest xray today  Short steroid burst  Trigger prevention   Plan  Patient Instructions  Prednisone 20mg  daily for 5 days , take with food.  Begin Claritin 10mg  daily for 1 week then As needed   Chest xray today .  Delsym 2 tsp Twice daily  As needed  cough Tessalon Three times a day  As needed  cough Continue on Symbicort 2 puffs Twice daily   Albuterol inhaler As needed   Follow up with Dr. Melvyn Novas  in 4-6 weeks and. As needed   Please contact office for sooner follow up if symptoms do not improve or worsen or seek emergency care

## 2021-04-22 NOTE — Patient Instructions (Addendum)
Prednisone 20mg  daily for 5 days , take with food.  Begin Claritin 10mg  daily for 1 week then As needed   Chest xray today .  Delsym 2 tsp Twice daily  As needed  cough Tessalon Three times a day  As needed  cough Continue on Symbicort 2 puffs Twice daily   Albuterol inhaler As needed   Follow up with Dr. Melvyn Novas  in 4-6 weeks and. As needed   Please contact office for sooner follow up if symptoms do not improve or worsen or seek emergency care

## 2021-04-22 NOTE — Progress Notes (Signed)
@Patient  ID: Andrew Moran, male    DOB: Dec 12, 1945, 76 y.o.   MRN: [id number]  Chief Complaint  Patient presents with   Acute Visit    Referring provider: London Pepper, MD  HPI: 76 year old male never smoker  followed for cough variant asthma  TEST/EVENTS :   04/22/2021 Acute OV : Asthma  Patient presents for an acute office visit.  Patient complains over the last week he has had sore throat , minimally productive cough and increased shortness of breath, sinus drainage, tightness  and wheezing.  Patient took a home COVID test that was negative.  Has had increased albuterol use.  Remains on Symbicort 2 twice daily.  Cough is making his muscles sore . No using any otc cold meds.  No body aches or fever. No recent travel or antibiotic use. Appetite is good . No n//v/d.  No chest pain , orthopnea , increased leg swelling , or palpitations.  No known sick contacts.  Asthma was doing very well until last week.     Allergies  Allergen Reactions   Zithromax [Azithromycin] Shortness Of Breath   Betadine [Povidone Iodine] Itching    Iodine drape, if left on too long    Immunization History  Administered Date(s) Administered   Fluad Quad(high Dose 65+) 01/14/2021   Influenza Whole 12/15/2016   Influenza, High Dose Seasonal PF 01/27/2016, 01/16/2019   Influenza-Unspecified 01/15/2018   PFIZER(Purple Top)SARS-COV-2 Vaccination 05/09/2019, 05/30/2019   Pneumococcal Conjugate-13 12/14/2014, 06/27/2015   Pneumococcal Polysaccharide-23 03/26/2012, 06/27/2014   Tdap 11/20/2011    Past Medical History:  Diagnosis Date   Arthritis    Asthma    pulmology-- dr wert--- cough varient versus uacs   GERD (gastroesophageal reflux disease)    per pt takes DGL supplement   History of diverticulitis of colon 2002   managed medically , no surgical intervention   History of DVT of lower extremity 06/27/2019   followed by dr g. Margaretann Loveless--  completed 3 months xarelto 07/ 2021   History of  kidney stones    Renal calculi    left side    Tobacco History: Social History   Tobacco Use  Smoking Status Never  Smokeless Tobacco Never   Counseling given: Not Answered   Outpatient Medications Prior to Visit  Medication Sig Dispense Refill   albuterol (PROVENTIL HFA;VENTOLIN HFA) 108 (90 BASE) MCG/ACT inhaler Inhale 2 puffs into the lungs every 4 (four) hours as needed for wheezing or shortness of breath. Wheezing      APPLE CIDER VINEGAR PO Take 5 mLs by mouth daily.     atorvastatin (LIPITOR) 20 MG tablet Take 20 mg by mouth at bedtime.     Multiple Vitamins-Minerals (MULTIVITAMINS THER. W/MINERALS) TABS Take 1 tablet by mouth daily.     OVER THE COUNTER MEDICATION Take 2 tablets by mouth at bedtime. Deglycyrrhizinated-DGL     SYMBICORT 80-4.5 MCG/ACT inhaler INHALE TWO PUFFS BY MOUTH TWICE A DAY 10.2 g 3   No facility-administered medications prior to visit.     Review of Systems:   Constitutional:   No  weight loss, night sweats,  Fevers, chills,+ fatigue, or  lassitude.  HEENT:   No headaches,  Difficulty swallowing,  Tooth/dental problems, or  Sore throat,                No sneezing, itching, ear ache,  +nasal congestion, post nasal drip,   CV:  No chest pain,  Orthopnea, PND, swelling in lower extremities, anasarca, dizziness,  palpitations, syncope.   GI  No heartburn, indigestion, abdominal pain, nausea, vomiting, diarrhea, change in bowel habits, loss of appetite, bloody stools.   Resp: .  No chest wall deformity  Skin: no rash or lesions.  GU: no dysuria, change in color of urine, no urgency or frequency.  No flank pain, no hematuria   MS:  No joint pain or swelling.  No decreased range of motion.  No back pain.    Physical Exam  BP 118/80 (BP Location: Left Arm, Patient Position: Sitting, Cuff Size: Normal)    Pulse 94    Temp 98.6 F (37 C) (Oral)    Ht 5\' 5"  (1.651 m)    Wt 172 lb (78 kg)    SpO2 97%    BMI 28.62 kg/m   GEN: A/Ox3; pleasant ,  NAD, well nourished    HEENT:  [place]/AT,   NOSE-clear, THROAT-clear, no lesions, no postnasal drip or exudate noted.   NECK:  Supple w/ fair ROM; no JVD; normal carotid impulses w/o bruits; no thyromegaly or nodules palpated; no lymphadenopathy.    RESP  Clear  P & A; w/o, wheezes/ rales/ or rhonchi. no accessory muscle use, no dullness to percussion  CARD:  RRR, no m/r/g, no peripheral edema, pulses intact, no cyanosis or clubbing.  GI:   Soft & nt; nml bowel sounds; no organomegaly or masses detected.   Musco: Warm bil, no deformities or joint swelling noted.   Neuro: alert, no focal deficits noted.    Skin: Warm, no lesions or rashes    Lab Results:  CBC   BMET   BNP No results found for: BNP  ProBNP No results found for: PROBNP  Imaging: No results found.    No flowsheet data found.  Lab Results  Component Value Date   NITRICOXIDE 22 05/10/2018        Assessment & Plan:   Asthma exacerbation Asthma flare -check chest xray today  Short steroid burst  Trigger prevention   Plan  Patient Instructions  Prednisone 20mg  daily for 5 days , take with food.  Begin Claritin 10mg  daily for 1 week then As needed   Chest xray today .  Delsym 2 tsp Twice daily  As needed  cough Tessalon Three times a day  As needed  cough Continue on Symbicort 2 puffs Twice daily   Albuterol inhaler As needed   Follow up with Dr. Melvyn Novas  in 4-6 weeks and. As needed   Please contact office for sooner follow up if symptoms do not improve or worsen or seek emergency care         Rexene Edison, NP 04/22/2021

## 2021-04-30 ENCOUNTER — Telehealth: Payer: Self-pay | Admitting: Adult Health

## 2021-04-30 NOTE — Progress Notes (Signed)
ATC patient x1, LVM to return call on home #.

## 2021-05-01 NOTE — Telephone Encounter (Signed)
Patient is returning phone call. Patient phone number is [phone].

## 2021-05-03 NOTE — Telephone Encounter (Signed)
Called and spoke with patient. He was calling for his CXR results. He verbalized understanding. Reminded him of his OV with MW next month.   Nothing further needed at time of call.

## 2021-05-13 DIAGNOSIS — N200 Calculus of kidney: Secondary | ICD-10-CM | POA: Diagnosis not present

## 2021-05-21 ENCOUNTER — Other Ambulatory Visit: Payer: Self-pay

## 2021-05-21 ENCOUNTER — Ambulatory Visit: Payer: Medicare Other | Admitting: Internal Medicine

## 2021-05-21 ENCOUNTER — Encounter: Payer: Self-pay | Admitting: Internal Medicine

## 2021-05-21 ENCOUNTER — Ambulatory Visit: Payer: Medicare Other

## 2021-05-21 DIAGNOSIS — J45991 Cough variant asthma: Secondary | ICD-10-CM

## 2021-05-21 MED ORDER — PREDNISONE 10 MG PO TABS: ORAL_TABLET | ORAL | 0 refills | Status: DC

## 2021-05-21 NOTE — Patient Instructions (Addendum)
At the onset of any cough/ throat clearing hoarseness  Add back :  Try prilosec otc '20mg'$   Take 30-60 min before first meal of the day and Pepcid ac (famotidine) 20 mg one after supper until better  for at least a week. ? ?If not improving > Prednisone 10 mg take  4 each am x 2 days,   2 each am x 2 days,  1 each am x 2 days and stop  ? ?  ? ?Please schedule a follow up office visit in 6 weeks, call sooner if needed   ?

## 2021-05-21 NOTE — Progress Notes (Signed)
? ?Subjective:  ? ?Patient ID: Andrew Moran, male    DOB: Jul 29, 1945,   MRN: [id number] ? ?  ? ?Brief patient profile:  ?26 yowm never smoker dx as asthma as child growing up in [place] mostly in winter months very limited activity due to sob and summer better s limits improved in his 69s then recurred in his 39's on prn saba but in 2000's required daily treatment with advair but only helped a little and still needed freq saba and saw Dr Bernita Buffy with no resp to shots so self referred to pulmonary clinic 06/26/2016 for refractory cough /sob with nl spirometry at initial ov.  ? ? ? ?History of Present Illness  ?06/26/2016 1st [facility] office visit/ Patrecia Veiga   ?Chief Complaint  ?Patient presents with  ? Advice Only  ?  self referral for emphysema.  worsening sob, nonprod cough Xseveral months.   ?? Chronic asthma seen here  By me and By Dr Joya Gaskins around 2006 but does not remember seeing [place] and whether this helped but now comes in with worsening dry cough / throat irritation and doe indolent onset gradually worse x 3 m esp p supper  But does ok sleeping at 45 degrees  s nasal symptoms at all some better p saba  ?rec ?Stop advair  ?Plan A = Automatic = symbicort 80 Take 2 puffs first thing in am and then another 2 puffs about 12 hours later.  ?Work on inhaler technique: ?Plan B = Backup ?Only use your albuterol as a rescue medication ?GERD diet  ? Add : Pantoprazole (protonix) 40 mg   Take  30-60 min before first meal of the day and Pepcid (famotidine)  20 mg one @  Bedtime ? ?02/20/2020  f/u ov/Demaya Hardge re: cough variant asthma / on symb 80 2bid / no longer on gerd rx ?Chief Complaint  ?Patient presents with  ? Follow-up  ?  non productive cough in mornings  ?Dyspnea:  Not limited by breathing from desired activities   ?Cough: slt raspy in am s much sputum production ?Sleeping: 30 degrees with reclining bed  ?SABA use: very rarely using  ?02: none  ?rec ?At the onset of any cough/ throat clearing hoarseness  Add  back :  Try prilosec otc '20mg'$   Take 30-60 min before first meal of the day and Pepcid ac (famotidine) 20 mg one after supper until better  for at least a week   ?GERD diet  ?  ? ?  ?05/21/2021  f/u ov/Alyson Ki re: cough variant asthma maint on symbicort 80   ?No chief complaint on file. ?Dyspnea:  Not limited by breathing from desired activities   ?Cough: hoarseness/ worse in pm mucoid sputum mmin ?Sleeping 30 degrees/ some pnds ?SABA use: up to 2-3 days  ?02: none  ?Covid status:   vax x 3  ? ? ?No obvious day to day or daytime variability or assoc excess/ purulent sputum or mucus plugs or hemoptysis or cp or chest tightness, subjective wheeze or overt sinus or hb symptoms.  ? ?  Also denies any obvious fluctuation of symptoms with weather or environmental changes or other aggravating or alleviating factors except as outlined above  ? ?No unusual exposure hx or h/o childhood pna/ asthma or knowledge of premature birth. ? ?Current Allergies, Complete Past Medical History, Past Surgical History, Family History, and Social History were reviewed in [employer] record. ? ?ROS  The following are not active complaints unless bolded ?Hoarseness,  sore throat, dysphagia, dental problems, itching, sneezing,  nasal congestion or discharge of excess mucus or purulent secretions, ear ache,   fever, chills, sweats, unintended wt loss or wt gain, classically pleuritic or exertional cp,  orthopnea pnd or arm/hand swelling  or leg swelling, presyncope, palpitations, abdominal pain, anorexia, nausea, vomiting, diarrhea  or change in bowel habits or change in bladder habits, change in stools or change in urine, dysuria, hematuria,  rash, arthralgias, visual complaints, headache, numbness, weakness or ataxia or problems with walking or coordination,  change in mood or  memory. ?      ? ?Current Meds  ?Medication Sig  ? albuterol (PROVENTIL HFA;VENTOLIN HFA) 108 (90 BASE) MCG/ACT inhaler Inhale 2 puffs into the lungs  every 4 (four) hours as needed for wheezing or shortness of breath. Wheezing ?  ? albuterol (VENTOLIN HFA) 108 (90 Base) MCG/ACT inhaler 2 puffs  ? APPLE CIDER VINEGAR PO Take 5 mLs by mouth daily.  ? atorvastatin (LIPITOR) 20 MG tablet Take 20 mg by mouth at bedtime.  ? methocarbamol (ROBAXIN) 500 MG tablet Take 1 tablet by mouth 4 (four) times daily.  ? Multiple Vitamins-Minerals (MULTIVITAMINS THER. W/MINERALS) TABS Take 1 tablet by mouth daily.  ? OVER THE COUNTER MEDICATION Take 2 tablets by mouth at bedtime. Deglycyrrhizinated-DGL  ? predniSONE (DELTASONE) 10 MG tablet Take  4 each am x 2 days,   2 each am x 2 days,  1 each am x 2 days and stop  ? SYMBICORT 80-4.5 MCG/ACT inhaler INHALE TWO PUFFS BY MOUTH TWICE A DAY  ?    ? ?  ? ?  ? ?   ?Objective:  ? Physical Exam ? ? wts ? ?05/21/2021          177 ?08/20/2020          183  ?02/20/2020        172   ?03/21/2019          184  ?05/10/2018        178  ?11/09/2017        183  ?05/11/2017        184  ?01/28/2017      180  ?10/29/2016        174   ?06/26/16 182 lb (82.6 kg)  ?10/10/13 181 lb (82.1 kg)  ?09/30/13 181 lb (82.1 kg)  ?  ?Vital signs reviewed  05/21/2021  - Note at rest 02 sats  97% on RA  ? ?General appearance:   amb wim with hoarseness/ mostly pseudowheeze    ?  ? ? HEENT : pt wearing mask not removed for exam due to covid -19 concerns.  ? ? ?NECK :  without JVD/Nodes/TM/ nl carotid upstrokes bilaterally ? ? ?LUNGS: no acc muscle use,  Nl contour chest which is clear to A and P bilaterally without cough on insp or exp maneuvers ? ? ?CV:  RRR  no s3 or murmur or increase in P2, and no edema  ? ?ABD:  soft and nontender with nl inspiratory excursion in the supine position. No bruits or organomegaly appreciated, bowel sounds nl ? ?MS:  Nl gait/ ext warm without deformities, calf tenderness, cyanosis or clubbing ?No obvious joint restrictions  ? ?SKIN: warm and dry without lesions   ? ?NEURO:  alert, approp, nl sensorium with  no motor or cerebellar deficits  apparent.  ?  ?  ?   ?Assessment & Plan:  ? ?

## 2021-05-22 ENCOUNTER — Encounter: Payer: Self-pay | Admitting: Internal Medicine

## 2021-05-22 NOTE — Assessment & Plan Note (Signed)
Onset in Childhood / recurred in his 92s ?Spirometry 06/26/2016  FEV1 2.35  (90%)  Ratio 70 with min curvature p am advair ?- FENO 06/26/2016  =   18 p am advair  ?- 06/26/2016   so try symb 80 2bid and stop advair ?- 10/29/2016    reduce symb back to 80 2bid and continue gerd x 3 months ?- 01/28/2017 flared on symb 80 with lots of upper airway symptoms  ?- FENO 01/28/2017  =   20 on symb 80 with flare of symptoms  so rec trial of 160 2bid   ?- Allergy profile 01/28/2017 >  Eos 0.4/  IgE  49  RAST pos dust /Cat/tree/ ragweed ?- FENO 05/10/2018  =   22 on symb 160 2 hs and prn daytime dosing  ?- maint on symb 80 2bid and no gerd rx as of 02/20/2020 > rec add back gerd if flare ? ?His symptoms have flared now since late Jan 2023 and failed to follow action plan previously given in writing which include the vital contingency to rx max for gerd until symptoms resolve for a full week prior to stopping > reviewed  ? ?If not better with gerd rx can try 6 d pred but no need to change maint rx at this point as this is mostly an upper airway issue at present with ent f/u prn truly refractory "wheeze"  ? ?    ?  ? ?Each maintenance medication was reviewed in detail including emphasizing most importantly the difference between maintenance and prns and under what circumstances the prns are to be triggered using an action plan format where appropriate. ? ?Total time for H and P, chart review, counseling, reviewing hfa device(s) and generating customized AVS unique to this office visit / same day charting = 20 min  ?     ?

## 2021-05-29 ENCOUNTER — Telehealth: Payer: Self-pay | Admitting: Internal Medicine

## 2021-05-29 DIAGNOSIS — J4531 Mild persistent asthma with (acute) exacerbation: Secondary | ICD-10-CM

## 2021-05-29 MED ORDER — ALBUTEROL SULFATE HFA 108 (90 BASE) MCG/ACT IN AERS: 2.0000 | INHALATION_SPRAY | RESPIRATORY_TRACT | 5 refills | Status: DC | PRN

## 2021-05-29 NOTE — Telephone Encounter (Signed)
Called patient and informed him that I refilled his albuterol inhaler and sent to the pharmacy that he asked for. Patient verbalized understanding. Nothing further needed  ?

## 2021-06-10 ENCOUNTER — Telehealth: Payer: Self-pay | Admitting: Internal Medicine

## 2021-06-10 NOTE — Telephone Encounter (Signed)
Patient is having a constant cough day and night. Patient states the sinus infection has gone away, but the cough has been there for about 7 weeks now. Patient is taking over the counter allergy tablets, Symbicort, albuterol, over the counter cough meds, cough drops, and using vaporub, nothing is giving relief. Patient denied an appointment because he is having the same problems as when he saw Dr. Melvyn Novas on 05/21/2021.  ? ?Uses [facility] on [address]. ? ?Please advise.  ?

## 2021-06-11 MED ORDER — PREDNISONE 10 MG PO TABS: ORAL_TABLET | ORAL | 0 refills | Status: DC

## 2021-06-11 NOTE — Telephone Encounter (Signed)
Lm for patient.  

## 2021-06-11 NOTE — Telephone Encounter (Signed)
My notes say ok to try Prednisone 10 mg take  4 each am x 2 days,   2 each am x 2 days,  1 each am x 2 days and stop again if willing but If couldn't fix this in person I'm not going to be able to fix it over the phone so will need ov with all meds in hand to regroup  - other option is see ENT  ?

## 2021-06-11 NOTE — Telephone Encounter (Signed)
Spoke to patient.  ?C/o constant dry cough day and night. Patient states the sinus infection has gone away, but the cough has been there for about 7 weeks now. Patient is taking over the counter allergy tablets, Symbicort, albuterol, over the counter cough meds, cough drops, and using vaporub, nothing is giving relief. Patient denied an appointment because he is having the same problems as when he saw Dr. Melvyn Novas on 05/21/2021.  ? ?Dr. Melvyn Novas, please advise.  ?

## 2021-06-11 NOTE — Telephone Encounter (Signed)
Patient is aware of below message and voiced his understanding.  ?Prednisone sent to preferred pharmacy. ?He will call back for OV if not improving.  ?Nothing further needed.  ? ?

## 2021-07-04 DIAGNOSIS — H52223 Regular astigmatism, bilateral: Secondary | ICD-10-CM | POA: Diagnosis not present

## 2021-07-04 DIAGNOSIS — H2513 Age-related nuclear cataract, bilateral: Secondary | ICD-10-CM | POA: Diagnosis not present

## 2021-07-04 DIAGNOSIS — H40023 Open angle with borderline findings, high risk, bilateral: Secondary | ICD-10-CM | POA: Diagnosis not present

## 2021-07-04 DIAGNOSIS — H524 Presbyopia: Secondary | ICD-10-CM | POA: Diagnosis not present

## 2021-07-04 DIAGNOSIS — H5203 Hypermetropia, bilateral: Secondary | ICD-10-CM | POA: Diagnosis not present

## 2021-07-22 DIAGNOSIS — L570 Actinic keratosis: Secondary | ICD-10-CM | POA: Diagnosis not present

## 2021-07-22 DIAGNOSIS — L820 Inflamed seborrheic keratosis: Secondary | ICD-10-CM | POA: Diagnosis not present

## 2021-07-22 DIAGNOSIS — L821 Other seborrheic keratosis: Secondary | ICD-10-CM | POA: Diagnosis not present

## 2021-07-22 DIAGNOSIS — L814 Other melanin hyperpigmentation: Secondary | ICD-10-CM | POA: Diagnosis not present

## 2021-07-22 DIAGNOSIS — D225 Melanocytic nevi of trunk: Secondary | ICD-10-CM | POA: Diagnosis not present

## 2021-08-09 DIAGNOSIS — H53433 Sector or arcuate defects, bilateral: Secondary | ICD-10-CM | POA: Diagnosis not present

## 2021-08-09 DIAGNOSIS — H40023 Open angle with borderline findings, high risk, bilateral: Secondary | ICD-10-CM | POA: Diagnosis not present

## 2021-12-09 DIAGNOSIS — J019 Acute sinusitis, unspecified: Secondary | ICD-10-CM | POA: Diagnosis not present

## 2021-12-09 DIAGNOSIS — M79642 Pain in left hand: Secondary | ICD-10-CM | POA: Diagnosis not present

## 2021-12-09 DIAGNOSIS — M7989 Other specified soft tissue disorders: Secondary | ICD-10-CM | POA: Diagnosis not present

## 2022-02-15 ENCOUNTER — Other Ambulatory Visit: Payer: Self-pay | Admitting: Internal Medicine

## 2022-02-20 DIAGNOSIS — H40023 Open angle with borderline findings, high risk, bilateral: Secondary | ICD-10-CM | POA: Diagnosis not present

## 2022-02-20 DIAGNOSIS — H2513 Age-related nuclear cataract, bilateral: Secondary | ICD-10-CM | POA: Diagnosis not present

## 2022-02-20 DIAGNOSIS — H53433 Sector or arcuate defects, bilateral: Secondary | ICD-10-CM | POA: Diagnosis not present

## 2022-04-02 DIAGNOSIS — R7303 Prediabetes: Secondary | ICD-10-CM | POA: Diagnosis not present

## 2022-04-02 DIAGNOSIS — M109 Gout, unspecified: Secondary | ICD-10-CM | POA: Diagnosis not present

## 2022-04-02 DIAGNOSIS — E785 Hyperlipidemia, unspecified: Secondary | ICD-10-CM | POA: Diagnosis not present

## 2022-04-02 DIAGNOSIS — Z0000 Encounter for general adult medical examination without abnormal findings: Secondary | ICD-10-CM | POA: Diagnosis not present

## 2022-04-02 DIAGNOSIS — J45909 Unspecified asthma, uncomplicated: Secondary | ICD-10-CM | POA: Diagnosis not present

## 2022-04-02 DIAGNOSIS — I8390 Asymptomatic varicose veins of unspecified lower extremity: Secondary | ICD-10-CM | POA: Diagnosis not present

## 2022-04-02 DIAGNOSIS — Z1211 Encounter for screening for malignant neoplasm of colon: Secondary | ICD-10-CM | POA: Diagnosis not present

## 2022-04-02 DIAGNOSIS — Z87442 Personal history of urinary calculi: Secondary | ICD-10-CM | POA: Diagnosis not present

## 2022-04-21 ENCOUNTER — Other Ambulatory Visit: Payer: Self-pay | Admitting: *Deleted

## 2022-04-21 DIAGNOSIS — M79605 Pain in left leg: Secondary | ICD-10-CM

## 2022-05-01 NOTE — Progress Notes (Signed)
Office Note    CC: left lower extremity intermittent heaviness with known varicose veins Requesting Provider:  London Pepper, MD  HPI: Andrew Moran is a 77 y.o. (August 27, 1945) male who presents at the request of London Pepper, MD for evaluation of left lower extremity intermittent heaviness with known varicose veins.  Andrew Moran today, Andrew Moran is doing well.  Originally from [place], he moved to [place] years ago for work.  Now retired, he lives independently with his wife, and remains active.  Andrew Moran had a knee replacement in [id number] complicated by postoperative deep venous thrombosis of the left lower extremity.  He was anticoagulated for 3 months with significant improvement in left lower extremity clot burden.  He has worn compression stockings in the past, but has not worn them for quite some time.  He presents today with intermittent heaviness in the left lower extremity which is not lifestyle limiting.  He has also noticed a superficial varicosity popliteal fossa which he had questions about.  Denies symptoms of claudication, ischemic rest pain, tissue loss.  Time denies burning, itching, no bleeding or ulceration. Time stated he has had vein procedures bilaterally, but could not remember exactly what they were.   Past Medical History:  Diagnosis Date   Arthritis    Asthma    pulmology-- dr wert--- cough varient versus uacs   GERD (gastroesophageal reflux disease)    per pt takes DGL supplement   History of diverticulitis of colon 2002   managed medically , no surgical intervention   History of DVT of lower extremity 06/27/2019   followed by dr g. Margaretann Loveless--  completed 3 months xarelto 07/ 2021   History of kidney stones    Renal calculi    left side    Past Surgical History:  Procedure Laterality Date   ANKLE HARDWARE REMOVAL Right 2008   CARPAL TUNNEL RELEASE Bilateral left 1983;  right [place], URETEROSCOPY AND STENT PLACEMENT Left 02/23/2020    Procedure: CYSTOSCOPY WITH RETROGRADE PYELOGRAM, URETEROSCOPY, LITHOTRIPSY, STONE EXTRACTION,  AND STENT PLACEMENT;  Surgeon: Franchot Gallo, MD;  Location: [facility];  Service: Urology;  Laterality: Left;   CYSTOSCOPY/RETROGRADE/URETEROSCOPY/STONE EXTRACTION WITH BASKET Bilateral 10/10/2013   Procedure: CYSTOSCOPY/URETEROSCOPY/STONE EXTRACTION WITH HOLMIUM LASER, bilateral retrograde, bilateral ureteral stents;  Surgeon: Jorja Loa, MD;  Location: [facility];  Service: Urology;  Laterality: Bilateral;   EXPLORATORY LAPAROTOMY  1970   EXPLORATORY STOMACH SURGERY FOR TEAR IN STOMACH WALL   EXTRACORPOREAL SHOCK WAVE LITHOTRIPSY Left 10/06/2019   Procedure: EXTRACORPOREAL SHOCK WAVE LITHOTRIPSY (ESWL);  Surgeon: Robley Fries, MD;  Location: [facility];  Service: Urology;  Laterality: Left;   EXTRACORPOREAL SHOCK WAVE LITHOTRIPSY  multiple since 2002,  last one 02-28-2013   HOLMIUM LASER APPLICATION Left 0000000   Procedure: HOLMIUM LASER APPLICATION;  Surgeon: Jorja Loa, MD;  Location: [facility];  Service: Urology;  Laterality: Left;   KNEE ARTHROSCOPY  02/05/2011   Procedure: ARTHROSCOPY KNEE;  Surgeon: Laurice Record [place];  Location: [facility];  Service: Orthopedics;  Laterality: Right;  Right knee arthroscopy partial lateral and medial meniscectomy with condyle debridement   LUMBAR SPINE SURGERY  x2  1995   ORIF ANKLE FRACTURE Right 1988   ROTATOR CUFF REPAIR Bilateral right 02/16/2018;  left [place]   TOTAL KNEE ARTHROPLASTY Left 04/08/2019   Procedure: TOTAL KNEE ARTHROPLASTY;  Surgeon: Sydnee Cabal, MD;  Location: [facility];  Service: Orthopedics;  Laterality:  Left;  with adductor canal    Social History   Socioeconomic History   Marital status: Married    Spouse name: Not on file   Number of children: Not on file   Years of education: Not on file   Highest education level: Not on file  Occupational  History   Not on file  Tobacco Use   Smoking status: Never   Smokeless tobacco: Never  Vaping Use   Vaping Use: Never used  Substance and Sexual Activity   Alcohol use: Yes    Comment: OCCASIONAL   Drug use: No   Sexual activity: Yes  Other Topics Concern   Not on file  Social History Narrative   Not on file   Social Determinants of Health   Financial Resource Strain: Not on file  Food Insecurity: Not on file  Transportation Needs: Not on file  Physical Activity: Not on file  Stress: Not on file  Social Connections: Not on file  Intimate Partner Violence: Not on file   *** Family History  Problem Relation Age of Onset   Heart attack Father     Current Outpatient Medications  Medication Sig Dispense Refill   albuterol (VENTOLIN HFA) 108 (90 Base) MCG/ACT inhaler 2 puffs     albuterol (VENTOLIN HFA) 108 (90 Base) MCG/ACT inhaler Inhale 2 puffs into the lungs every 4 (four) hours as needed for wheezing or shortness of breath. Wheezing 6.7 g 5   APPLE CIDER VINEGAR PO Take 5 mLs by mouth daily.     atorvastatin (LIPITOR) 20 MG tablet Take 20 mg by mouth at bedtime.     methocarbamol (ROBAXIN) 500 MG tablet Take 1 tablet by mouth 4 (four) times daily.     Multiple Vitamins-Minerals (MULTIVITAMINS THER. W/MINERALS) TABS Take 1 tablet by mouth daily.     OVER THE COUNTER MEDICATION Take 2 tablets by mouth at bedtime. Deglycyrrhizinated-DGL     predniSONE (DELTASONE) 10 MG tablet Take  4 each am x 2 days,   2 each am x 2 days,  1 each am x 2 days and stop 14 tablet 0   predniSONE (DELTASONE) 10 MG tablet 4tabx2d,2tabx2d,1tabx2d 14 tablet 0   SYMBICORT 80-4.5 MCG/ACT inhaler INHALE TWO PUFFS BY MOUTH TWICE A DAY 10.2 g 3   No current facility-administered medications for this visit.    Allergies  Allergen Reactions   Zithromax [Azithromycin] Shortness Of Breath   Betadine [Povidone Iodine] Itching    Iodine drape, if left on too long     REVIEW OF SYSTEMS:  *** [X]$   denotes positive finding, [ ]$  denotes negative finding Cardiac  Comments:  Chest pain or chest pressure:    Shortness of breath upon exertion:    Short of breath when lying flat:    Irregular heart rhythm:        Vascular    Pain in calf, thigh, or hip brought on by ambulation:    Pain in feet at night that wakes you up from your sleep:     Blood clot in your veins:    Leg swelling:         Pulmonary    Oxygen at home:    Productive cough:     Wheezing:         Neurologic    Sudden weakness in arms or legs:     Sudden numbness in arms or legs:     Sudden onset of difficulty speaking or slurred speech:  Temporary loss of vision in one eye:     Problems with dizziness:         Gastrointestinal    Blood in stool:     Vomited blood:         Genitourinary    Burning when urinating:     Blood in urine:        Psychiatric    Major depression:         Hematologic    Bleeding problems:    Problems with blood clotting too easily:        Skin    Rashes or ulcers:        Constitutional    Fever or chills:      PHYSICAL EXAMINATION:  There were no vitals filed for this visit.  General:  WDWN in NAD; vital signs documented above Gait: Not observed HENT: WNL, normocephalic Pulmonary: normal non-labored breathing , without Rales, rhonchi,  wheezing Cardiac: {Desc; regular/irreg:14544} HR, without  Murmurs {With/Without:20273} carotid bruit*** Abdomen: soft, NT, no masses Skin: {With/Without:20273} rashes Vascular Exam/Pulses:  Right Left  Radial {Exam; arterial pulse strength 0-4:30167} {Exam; arterial pulse strength 0-4:30167}  Ulnar {Exam; arterial pulse strength 0-4:30167} {Exam; arterial pulse strength 0-4:30167}  Femoral {Exam; arterial pulse strength 0-4:30167} {Exam; arterial pulse strength 0-4:30167}  Popliteal {Exam; arterial pulse strength 0-4:30167} {Exam; arterial pulse strength 0-4:30167}  DP {Exam; arterial pulse strength 0-4:30167} {Exam; arterial  pulse strength 0-4:30167}  PT {Exam; arterial pulse strength 0-4:30167} {Exam; arterial pulse strength 0-4:30167}   Extremities: {With/Without:20273} ischemic changes, {With/Without:20273} Gangrene , {With/Without:20273} cellulitis; {With/Without:20273} open wounds;  Musculoskeletal: no muscle wasting or atrophy  Neurologic: A&O X 3;  No focal weakness or paresthesias are detected Psychiatric:  The pt has {Desc; normal/abnormal:11317::"Normal"} affect.   Non-Invasive Vascular Imaging:   +--------------+---------+------+-----------+------------+-------------+  LEFT         Reflux NoRefluxReflux TimeDiameter cmsComments                               Yes                                        +--------------+---------+------+-----------+------------+-------------+  CFV                    yes   >1 second                            +--------------+---------+------+-----------+------------+-------------+  FV mid                  yes   >1 second                            +--------------+---------+------+-----------+------------+-------------+  FV dist                 yes   >1 second                            +--------------+---------+------+-----------+------------+-------------+  Popliteal              yes   >1 second                            +--------------+---------+------+-----------+------------+-------------+  GSV at [facility]    no                           0.751                   +--------------+---------+------+-----------+------------+-------------+  GSV prox thighno                           0.373                   +--------------+---------+------+-----------+------------+-------------+  GSV mid thigh no                           0.313    out of fascia  +--------------+---------+------+-----------+------------+-------------+  GSV dist thighno                           0.361                    +--------------+---------+------+-----------+------------+-------------+  GSV at knee   no                           0.388                   +--------------+---------+------+-----------+------------+-------------+  GSV prox calf no                           0.224                   +--------------+---------+------+-----------+------------+-------------+  SSV Pop Fossa                                       varicosities   +--------------+---------+------+-----------+------------+-------------+  SSV prox calf           yes    >500 ms              varicosities   +--------------+---------+------+-----------+------------+-------------+  SSV mid calf                                        varicosities   +--------------+---------+------+-----------+------------+-------------+     ASSESSMENT/PLAN:: 77 y.o. male presenting with intermittent heaviness in the left lower extremity.  Venous duplex ultrasound of the left lower extremity demonstrated venous reflux in the deep system involving the common femoral vein, femoral vein, popliteal vein.  With previous history of deep venous thrombosis, Andrew Moran has a mild case of post thrombotic syndrome.  When I had a long discussion regarding the above.  We discussed the importance of compression stockings, and elevation.  There is no reflux in the greater saphenous vein, therefore he does not require ablation.  We discussed his varicosities, and specifically, the superficial varicose vein he is concerned about.  We discussed that if bleeding should occur he should hold manual pressure which will stop the bleeding.  Without a bleeding episode, there is no indication for stripping as they are asymptomatic.  Andrew Moran is aware that wearing compression stockings will help prevent further bilateral lower extremity edema, as well as skin changes and tissue changes that can occur such as  lipodermatosclerosis.  Currently soft, with no skin  changes.  I asked Andrew Moran to call my office should any questions or concerns arise.  He can follow-up with me as needed.   ***   Andrew John, MD [facility] [phone]

## 2022-05-02 ENCOUNTER — Ambulatory Visit: Payer: Medicare Other | Admitting: Vascular Surgery

## 2022-05-02 ENCOUNTER — Ambulatory Visit (HOSPITAL_COMMUNITY)
Admission: RE | Admit: 2022-05-02 | Discharge: 2022-05-02 | Disposition: A | Discharge status: Home / Self Care | Payer: Medicare Other | Source: Ambulatory Visit | Attending: Vascular Surgery | Admitting: Vascular Surgery

## 2022-05-02 ENCOUNTER — Encounter: Payer: Self-pay | Admitting: Vascular Surgery

## 2022-05-02 VITALS — BP 179/99 | HR 55 | Temp 98.4°F | Resp 20 | Ht 66.0 in | Wt 175.0 lb

## 2022-05-02 DIAGNOSIS — I87002 Postthrombotic syndrome without complications of left lower extremity: Secondary | ICD-10-CM

## 2022-05-02 DIAGNOSIS — M79605 Pain in left leg: Secondary | ICD-10-CM | POA: Insufficient documentation

## 2022-06-26 DIAGNOSIS — D125 Benign neoplasm of sigmoid colon: Secondary | ICD-10-CM | POA: Diagnosis not present

## 2022-06-26 DIAGNOSIS — D120 Benign neoplasm of cecum: Secondary | ICD-10-CM | POA: Diagnosis not present

## 2022-06-26 DIAGNOSIS — K5730 Diverticulosis of large intestine without perforation or abscess without bleeding: Secondary | ICD-10-CM | POA: Diagnosis not present

## 2022-06-26 DIAGNOSIS — K648 Other hemorrhoids: Secondary | ICD-10-CM | POA: Diagnosis not present

## 2022-06-26 DIAGNOSIS — Z86010 Personal history of colonic polyps: Secondary | ICD-10-CM | POA: Diagnosis not present

## 2022-06-26 DIAGNOSIS — Z09 Encounter for follow-up examination after completed treatment for conditions other than malignant neoplasm: Secondary | ICD-10-CM | POA: Diagnosis not present

## 2022-06-30 DIAGNOSIS — D125 Benign neoplasm of sigmoid colon: Secondary | ICD-10-CM | POA: Diagnosis not present

## 2022-06-30 DIAGNOSIS — D120 Benign neoplasm of cecum: Secondary | ICD-10-CM | POA: Diagnosis not present

## 2022-07-10 DIAGNOSIS — H25812 Combined forms of age-related cataract, left eye: Secondary | ICD-10-CM | POA: Diagnosis not present

## 2022-07-10 DIAGNOSIS — H35033 Hypertensive retinopathy, bilateral: Secondary | ICD-10-CM | POA: Diagnosis not present

## 2022-07-10 DIAGNOSIS — H35363 Drusen (degenerative) of macula, bilateral: Secondary | ICD-10-CM | POA: Diagnosis not present

## 2022-07-10 DIAGNOSIS — H25813 Combined forms of age-related cataract, bilateral: Secondary | ICD-10-CM | POA: Diagnosis not present

## 2022-07-10 DIAGNOSIS — H04123 Dry eye syndrome of bilateral lacrimal glands: Secondary | ICD-10-CM | POA: Diagnosis not present

## 2022-07-23 DIAGNOSIS — L820 Inflamed seborrheic keratosis: Secondary | ICD-10-CM | POA: Diagnosis not present

## 2022-07-23 DIAGNOSIS — I788 Other diseases of capillaries: Secondary | ICD-10-CM | POA: Diagnosis not present

## 2022-07-23 DIAGNOSIS — D485 Neoplasm of uncertain behavior of skin: Secondary | ICD-10-CM | POA: Diagnosis not present

## 2022-07-23 DIAGNOSIS — L821 Other seborrheic keratosis: Secondary | ICD-10-CM | POA: Diagnosis not present

## 2022-07-23 DIAGNOSIS — L570 Actinic keratosis: Secondary | ICD-10-CM | POA: Diagnosis not present

## 2022-07-23 DIAGNOSIS — Q825 Congenital non-neoplastic nevus: Secondary | ICD-10-CM | POA: Diagnosis not present

## 2022-07-23 DIAGNOSIS — I8392 Asymptomatic varicose veins of left lower extremity: Secondary | ICD-10-CM | POA: Diagnosis not present

## 2022-07-23 DIAGNOSIS — Q828 Other specified congenital malformations of skin: Secondary | ICD-10-CM | POA: Diagnosis not present

## 2022-07-23 DIAGNOSIS — D2261 Melanocytic nevi of right upper limb, including shoulder: Secondary | ICD-10-CM | POA: Diagnosis not present

## 2022-07-23 DIAGNOSIS — D225 Melanocytic nevi of trunk: Secondary | ICD-10-CM | POA: Diagnosis not present

## 2022-07-23 DIAGNOSIS — D692 Other nonthrombocytopenic purpura: Secondary | ICD-10-CM | POA: Diagnosis not present

## 2022-08-18 DIAGNOSIS — R030 Elevated blood-pressure reading, without diagnosis of hypertension: Secondary | ICD-10-CM | POA: Diagnosis not present

## 2022-08-20 DIAGNOSIS — H25812 Combined forms of age-related cataract, left eye: Secondary | ICD-10-CM | POA: Diagnosis not present

## 2022-08-20 DIAGNOSIS — H268 Other specified cataract: Secondary | ICD-10-CM | POA: Diagnosis not present

## 2022-08-28 NOTE — Progress Notes (Signed)
Cardiology Clinic Note   Date: 08/29/2022 ID: Andrew Moran, DOB [birth year], MRN [id number]  Primary Cardiologist:  Parke Poisson, MD  Patient Profile    Andrew Moran is a 77 y.o. male who presents to the clinic today for evaluation of high BP.     Past medical history significant for: DOE. Nuclear stress test 05/29/2020: Normal study.  No ischemia or infarction on perfusion images. Echo 05/29/2020: EF 60 to 65%.  Indeterminate diastolic parameters.  Normal RV function.  Mildly elevated PA pressure.  Moderate BAE.  Trivial MR. Palpitations. Cardiac event monitor 06/02/2020: Min HR 51 bpm, max HR 123 bpm.  Rare ectopy.  No pauses.  No A-fib. DVT. Venous ultrasound 06/27/2019: No evidence of common femoral vein obstruction on the right.  Findings consistent with acute DVT involving the left peroneal veins.  Findings consistent with age indeterminant DVT involving the left gastrocnemius veins. Asthma.     History of Present Illness    Andrew Moran was first evaluated by Dr. Jacques Navy on 06/27/2019 for left lower extremity DVT post left total knee replacement in January 2021 (details above).  Patient was started on Xarelto.  Patient was last seen in the office by Dr. Jacques Navy on 05/30/2020 to follow-up on cardiac testing.  At visit prior patient complained of dyspnea and substernal chest discomfort with exertion.  Reported strong family history of CAD in father with MI at age 70 and fatal MI at age 83.  Nuclear stress testing was normal.  Echo showed normal LV/RV function with mildly elevated RVSP.  Sleep study was offered but he deferred.  Today, patient is here alone. He reports since Memorial Day weekend his BP has been trending up.  He states his wife noticed that he did like his normal self so she checked his BP and it was 211/101.  She began checking it regularly and it has continued to be high.  Checks his BP with a wrist cuff and provides the following readings: 5/30 164/98,  139/88 5/31 148/88, 165/95 6/1 187/108 6/2 160/80, 148/103, 145/81 6/7 159/70 6/8 153/79 6/9 152/91 6/10 173/101 6/11 151/91 Follow-up readings >200/100 on 5/25, and 5/28.  This morning BP 122/80.  Patient reports his BP is normally in the 120/80 range. He denies headaches, dizziness or blurred vision with high BP readings. He recently underwent cataract surgery.  Patient also reports L>R lower extremity edema that is at its best in the morning and progresses throughout the day.  He sits in a dependent position throughout much of the day. The edema is not a new issue for him but it has been slightly increased recently. He is active [place] for about 20 minutes every day.  He restricts sodium his diet. Patient denies shortness of breath or dyspnea on exertion. No chest pain, pressure, or tightness. Denies orthopnea or PND.  He reports occasional brief "fluttering" in his chest that lasts less than a minute and resolves on its own.  He usually notices this when he is sitting watching TV.  He also reports very brief less than 1 minute episodes of dizziness when he sits to watch TV.  This happens fairly frequently but he is unsure if it is associated with the fluttering.  He is very adamant about not wanting to take medications.    ROS: All other systems reviewed and are otherwise negative except as noted in History of Present Illness.  Studies Reviewed    ECG  personally reviewed by me today: Sinus rhythm with PACs, 73 bpm.  No significant changes from 04/27/2020.          Physical Exam    VS:  BP 138/64 (BP Location: Left Arm, Patient Position: Sitting, Cuff Size: Normal)   Pulse 73   Ht 5\' 5"  (1.651 m)   Wt 173 lb 6.4 oz (78.7 kg)   SpO2 97%   BMI 28.86 kg/m  , BMI Body mass index is 28.86 kg/m.  GEN: Well nourished, well developed, in no acute distress. Neck: No JVD or carotid bruits. Cardiac:  RRR. No murmurs. No rubs or gallops.   Respiratory:   Respirations regular and unlabored. Clear to auscultation without rales, wheezing or rhonchi. GI: Soft, nontender, nondistended. Extremities: Radials/DP/PT 2+ and equal bilaterally. No clubbing or cyanosis. Mild lower extremity edema L>R.   Skin: Warm and dry, no rash. Neuro: Strength intact.  Assessment & Plan    Elevated BP.  Patient reports elevated BP readings since Memorial Day his wife felt that he did not like normal and checked his BP with a reading of 211/101.  It has continued to be high.  Readings listed above in HPI and scanned into the chart.  He reports BP normally 120/80.  He denies any dietary indiscretion or increased stress.  He denies dizziness, headaches, vision changes.  He uses a wrist cuff that he does not have with him today.  He states his wife checks her BP at even when he gets high readings it is still normal for her.  She is adamant about not wanting to start medications.  Given normal BP today will allow patient to monitor BP for another week. I have asked him to obtain a new cuff. If he cannot get one I will bring him back in for a nurse visit to check accuracy of his current cuff.   He will contact me via MyChart with readings. Lower extremity edema.  Patient reports L>R lower extremity edema that is at its best in the morning and progresses throughout the day.  He does sit in a dependent position for much of the day.  He denies DOE, orthopnea, PND.  He restricts sodium in his diet.  While this is a chronic issue for him he feels it may be a little more than it has been.  Will get echo for further evaluation.  Instructed patient to wear compression socks and elevate legs is much as possible.  Disposition: Echo. BP log for 1 week and send readings through mychart. If BP readings remain high, I will likely start patient on Losartan 25 mg daily. Return in 1 year or sooner as needed.          Signed, Etta Grandchild. Kenyatta Keidel, DNP, NP-C

## 2022-08-29 ENCOUNTER — Ambulatory Visit: Payer: Medicare Other | Attending: Student | Admitting: Student

## 2022-08-29 ENCOUNTER — Encounter: Payer: Self-pay | Admitting: Student

## 2022-08-29 VITALS — BP 138/64 | HR 73 | Ht 65.0 in | Wt 173.4 lb

## 2022-08-29 DIAGNOSIS — R030 Elevated blood-pressure reading, without diagnosis of hypertension: Secondary | ICD-10-CM | POA: Diagnosis not present

## 2022-08-29 DIAGNOSIS — R600 Localized edema: Secondary | ICD-10-CM | POA: Diagnosis not present

## 2022-08-29 NOTE — Patient Instructions (Signed)
Medication Instructions:  Your physician recommends that you continue on your current medications as directed. Please refer to the Current Medication list given to you today.  *If you need a refill on your cardiac medications before your next appointment, please call your pharmacy*  Please take your blood pressure daily for 2 weeks and send in a MyChart message. Please include heart rates.   HOW TO TAKE YOUR BLOOD PRESSURE: Rest 5 minutes before taking your blood pressure. Don't smoke or drink caffeinated beverages for at least 30 minutes before. Take your blood pressure before (not after) you eat. Sit comfortably with your back supported and both feet on the floor (don't cross your legs). Elevate your arm to heart level on a table or a desk. Use the proper sized cuff. It should fit smoothly and snugly around your bare upper arm. There should be enough room to slip a fingertip under the cuff. The bottom edge of the cuff should be 1 inch above the crease of the elbow. Ideally, take 3 measurements at one sitting and record the average.    Lab Work: NONE If you have labs (blood work) drawn today and your tests are completely normal, you will receive your results only by: MyChart Message (if you have MyChart) OR A paper copy in the mail If you have any lab test that is abnormal or we need to change your treatment, we will call you to review the results.   Testing/Procedures: Your physician has requested that you have an echocardiogram. Echocardiography is a painless test that uses sound waves to create images of your heart. It provides your doctor with information about the size and shape of your heart and how well your heart's chambers and valves are working. This procedure takes approximately one hour. There are no restrictions for this procedure. Please do NOT wear cologne, perfume, aftershave, or lotions (deodorant is allowed). Please arrive 15 minutes prior to your appointment  time.    Follow-Up: At [facility], you and your health needs are our priority.  As part of our continuing mission to provide you with exceptional heart care, we have created designated Provider Care Teams.  These Care Teams include your primary Cardiologist (physician) and Advanced Practice Providers (APPs -  Physician Assistants and Nurse Practitioners) who all work together to provide you with the care you need, when you need it.  We recommend signing up for the patient portal called "MyChart".  Sign up information is provided on this After Visit Summary.  MyChart is used to connect with patients for Virtual Visits (Telemedicine).  Patients are able to view lab/test results, encounter notes, upcoming appointments, etc.  Non-urgent messages can be sent to your provider as well.   To learn more about what you can do with MyChart, go to [email or web address].    Your next appointment:   1 year(s)  Provider:   Parke Poisson, MD

## 2022-09-08 ENCOUNTER — Telehealth: Payer: Self-pay | Admitting: Student

## 2022-09-08 NOTE — Telephone Encounter (Signed)
Reviewed BP readings send through My Chart. Please contact the patient and let him know I would like him to start Losartan 25 mg daily. Check BP 2 hours after taking medication. Send in readings through my chart in 2 weeks.  He will need to come in for BMP in 2 weeks.   Thank you!  DW

## 2022-09-17 NOTE — Telephone Encounter (Signed)
Called  and spoke to patient . Patient stateshe had discuss with Gavin Pound  or someone  ,he did not want to strt taking in medication at present .  Patient states he thought that is what the upcoming echo was all about.   Patient sates he has some new  blood pressure reading to send in for review.   Patient states he will send reading  through mychart. Patient states he takes  his wife takes his blood pressure first thing in the morning before he drinks coffee   He states range has been in the 120's -130's  systolic.   Rn informed patient will send message to [employer] .  Keep appointment for echo on 09/22/22.  Patient verbalized understanding.

## 2022-09-22 ENCOUNTER — Ambulatory Visit (HOSPITAL_COMMUNITY): Payer: Medicare Other | Attending: Cardiovascular Disease

## 2022-09-22 DIAGNOSIS — R600 Localized edema: Secondary | ICD-10-CM | POA: Diagnosis not present

## 2022-09-22 DIAGNOSIS — R030 Elevated blood-pressure reading, without diagnosis of hypertension: Secondary | ICD-10-CM | POA: Insufficient documentation

## 2022-09-22 LAB — ECHOCARDIOGRAM COMPLETE
Area-P 1/2: 2.81 cm2
S' Lateral: 2.7 cm

## 2022-10-02 DIAGNOSIS — H25811 Combined forms of age-related cataract, right eye: Secondary | ICD-10-CM | POA: Diagnosis not present

## 2022-10-08 DIAGNOSIS — H25811 Combined forms of age-related cataract, right eye: Secondary | ICD-10-CM | POA: Diagnosis not present

## 2022-10-08 DIAGNOSIS — H268 Other specified cataract: Secondary | ICD-10-CM | POA: Diagnosis not present

## 2022-12-23 DIAGNOSIS — Z23 Encounter for immunization: Secondary | ICD-10-CM | POA: Diagnosis not present

## 2023-01-15 ENCOUNTER — Other Ambulatory Visit: Payer: Self-pay | Admitting: Medical Genetics

## 2023-01-15 DIAGNOSIS — Z006 Encounter for examination for normal comparison and control in clinical research program: Secondary | ICD-10-CM

## 2023-02-09 ENCOUNTER — Other Ambulatory Visit (HOSPITAL_COMMUNITY)
Admission: RE | Admit: 2023-02-09 | Discharge: 2023-02-09 | Disposition: A | Discharge status: Home / Self Care | Payer: Medicare Other | Source: Ambulatory Visit | Attending: Oncology | Admitting: Oncology

## 2023-02-09 DIAGNOSIS — Z006 Encounter for examination for normal comparison and control in clinical research program: Secondary | ICD-10-CM | POA: Insufficient documentation

## 2023-02-22 LAB — GENECONNECT MOLECULAR SCREEN: Genetic Analysis Overall Interpretation: NEGATIVE

## 2023-04-15 DIAGNOSIS — Z0000 Encounter for general adult medical examination without abnormal findings: Secondary | ICD-10-CM | POA: Diagnosis not present

## 2023-04-15 DIAGNOSIS — Z87442 Personal history of urinary calculi: Secondary | ICD-10-CM | POA: Diagnosis not present

## 2023-04-15 DIAGNOSIS — R7303 Prediabetes: Secondary | ICD-10-CM | POA: Diagnosis not present

## 2023-04-15 DIAGNOSIS — M109 Gout, unspecified: Secondary | ICD-10-CM | POA: Diagnosis not present

## 2023-04-15 DIAGNOSIS — J45909 Unspecified asthma, uncomplicated: Secondary | ICD-10-CM | POA: Diagnosis not present

## 2023-04-15 DIAGNOSIS — E785 Hyperlipidemia, unspecified: Secondary | ICD-10-CM | POA: Diagnosis not present

## 2023-05-01 DIAGNOSIS — J45909 Unspecified asthma, uncomplicated: Secondary | ICD-10-CM | POA: Diagnosis not present

## 2023-05-01 DIAGNOSIS — U071 COVID-19: Secondary | ICD-10-CM | POA: Diagnosis not present

## 2023-06-04 ENCOUNTER — Other Ambulatory Visit: Payer: Self-pay | Admitting: Internal Medicine

## 2023-06-05 ENCOUNTER — Telehealth: Payer: Self-pay | Admitting: Internal Medicine

## 2023-06-05 DIAGNOSIS — J45991 Cough variant asthma: Secondary | ICD-10-CM

## 2023-06-05 DIAGNOSIS — J4521 Mild intermittent asthma with (acute) exacerbation: Secondary | ICD-10-CM

## 2023-06-05 MED ORDER — BUDESONIDE-FORMOTEROL FUMARATE 80-4.5 MCG/ACT IN AERO: 2.0000 | INHALATION_SPRAY | Freq: Two times a day (BID) | RESPIRATORY_TRACT | 2 refills | Status: DC

## 2023-06-05 NOTE — Telephone Encounter (Signed)
 Spoke with patient regarding prior message.Patient requesting Symicort refill and made a office visit with Dr.Wert for 07/30/2023. Patient's voice was understanding.Nothing else further needed.

## 2023-06-05 NOTE — Telephone Encounter (Signed)
 Patient would like a refill of Symbicort. I notified him that he has not been seen in over a year so he would need an appointment. He wants the refill without the appointment. Please call and advise [phone]   Karin Golden on [address]

## 2023-06-06 ENCOUNTER — Other Ambulatory Visit: Payer: Self-pay | Admitting: Internal Medicine

## 2023-06-06 DIAGNOSIS — J4521 Mild intermittent asthma with (acute) exacerbation: Secondary | ICD-10-CM

## 2023-06-06 DIAGNOSIS — J45991 Cough variant asthma: Secondary | ICD-10-CM

## 2023-06-08 ENCOUNTER — Telehealth: Payer: Self-pay | Admitting: Internal Medicine

## 2023-06-08 DIAGNOSIS — J4521 Mild intermittent asthma with (acute) exacerbation: Secondary | ICD-10-CM

## 2023-06-08 DIAGNOSIS — J45991 Cough variant asthma: Secondary | ICD-10-CM

## 2023-06-08 NOTE — Telephone Encounter (Signed)
 Patient states needs Symbicort to go to [facility] [address]. Patient phone number is [phone].

## 2023-06-09 ENCOUNTER — Encounter: Payer: Self-pay | Admitting: *Deleted

## 2023-06-09 MED ORDER — BUDESONIDE-FORMOTEROL FUMARATE 80-4.5 MCG/ACT IN AERO
2.0000 | INHALATION_SPRAY | Freq: Two times a day (BID) | RESPIRATORY_TRACT | 2 refills | Status: DC
Start: 2023-06-09 — End: 2023-06-23

## 2023-06-09 NOTE — Telephone Encounter (Signed)
 I have sent the rx for symbicort to preferred pharm  Sent the pt msg letting him know via mychart

## 2023-06-16 DIAGNOSIS — S060XAA Concussion with loss of consciousness status unknown, initial encounter: Secondary | ICD-10-CM

## 2023-06-16 HISTORY — DX: Concussion with loss of consciousness status unknown, initial encounter: S06.0XAA

## 2023-06-22 ENCOUNTER — Telehealth: Payer: Self-pay

## 2023-06-22 NOTE — Telephone Encounter (Signed)
 Copied from CRM [id number]. Topic: Clinical - Prescription Issue >> Jun 19, 2023  3:43 PM Gaetano Hawthorne wrote: Reason for CRM: Patient called due to a prescription issue with his Symbicort - it's currently $ 255 copay as it's moved up to tier 3. Patient mentioned that he used take Advair for years and used to just pay 47 dollars for his Symbicort. Patient would like to know if the office has any samples and/or if there is something similar that could be prescribed for him? As the copay is very high.  CRM being handled in telephone encounter.

## 2023-06-22 NOTE — Telephone Encounter (Signed)
 Cheapest is wixela 100 one bid   Should also try generic symbicort 80 also Breyna 80   We don't have samples of any

## 2023-06-22 NOTE — Telephone Encounter (Signed)
 Copied from CRM: Patient called due to a prescription issue with his Symbicort - it's currently $ 255 copay as it's moved up to tier 3. Patient mentioned that he used take Advair for years and used to just pay 47 dollars for his Symbicort. Patient would like to know if the office has any samples and/or if there is something similar that could be prescribed for him? As the copay is very high.  Please advise with formulary alternatives. Routing to Dr. Sherene Sires and pharmacy.

## 2023-06-23 NOTE — Telephone Encounter (Signed)
 When attempting to prescribe Andrew Moran it comes up at Tier 3/5, patient states this will be the same cost as Symbicort. Recommended he contact [employer] for assistance with finding a comparable alternative in a lower tier. He states he will call [facility] for assistance and let us know which inhalers they cover. Nothing further needed at this time.

## 2023-06-23 NOTE — Telephone Encounter (Signed)
 We don't have samples  Send to pharmacy team for cheapest alternative if he has not been able to look these up in his drug formulary

## 2023-06-23 NOTE — Telephone Encounter (Signed)
 Copied from CRM [id number]. Topic: Clinical - Medication Question >> Jun 23, 2023 11:30 AM Konrad Dolores wrote: Reason for CRM: Patient stated the amount for his copay for budesonide-formoterol (SYMBICORT) 80-4.5 MCG/ACT inhaler is a bit too much due to the medication being in a different tier and Dr. Sherene Sires followed up stating there were other options available instead of budesonide-formoterol (SYMBICORT) 80-4.5 MCG/ACT inhaler. Tom wanted to know the differences from budesonide-formoterol (SYMBICORT) 80-4.5 MCG/ACT inhaler and Wixela 100 one bid and Breyna 80. If a sample of either of these medications can be called in until his appointment with Dr. Sherene Sires on 07/30/2023, please send to preferred pharmacy [facility] [id number] - [place], [place] - [address] [place] [address] Phone: [phone] Fax: [phone] Hours: Not open 24 hours. Please follow up with the patient with a call back at [phone].  Please clarify if you would like pt to use wixela or generic symbicort.

## 2023-06-23 NOTE — Telephone Encounter (Signed)
 Per pharmacy Symbicort is covered for NAME BRAND ONLY.   They will cover: Andrew Moran Advair  Please advise, thank you!

## 2023-06-23 NOTE — Telephone Encounter (Signed)
Dulera 100 Take 2 puffs first thing in am and then another 2 puffs about 12 hours later.  ?  ?

## 2023-06-30 NOTE — Telephone Encounter (Signed)
 Copied from CRM [id number]. Topic: Clinical - Medication Question >> Jun 23, 2023 11:30 AM Tyronne Galloway wrote: Reason for CRM: Patient stated the amount for his copay for budesonide-formoterol (SYMBICORT) 80-4.5 MCG/ACT inhaler is a bit too much due to the medication being in a different tier and Dr. Waymond Hailey followed up stating there were other options available instead of budesonide-formoterol (SYMBICORT) 80-4.5 MCG/ACT inhaler. Tom wanted to know the differences from budesonide-formoterol (SYMBICORT) 80-4.5 MCG/ACT inhaler and Wixela 100 one bid and Breyna 80. If a sample of either of these medications can be called in until his appointment with Dr. Waymond Hailey on 07/30/2023, please send to preferred pharmacy [facility] [id number] - [place], [place] - [address] [place] [address] Phone: [phone] Fax: [phone] Hours: Not open 24 hours. Please follow up with the patient with a call back at [phone]. >> Jun 30, 2023 12:31 PM Margarette Shawl wrote: Patient is calling clinic to advise he has spoken with [facility] concerning an alternative to Symbicort and has been told Breo, Dulera, and Advair are all considered Tier 3 or higher due to the class of medication. He is calling to see if there is an alternate inhaler he could try, as he has not used Symbicort for an extended period of time and is concerned that his next appt is not until 07/2023.   CB#  [phone](home number)

## 2023-06-30 NOTE — Telephone Encounter (Signed)
 Per patient: Breo, Dulera, and Advair are all considered Tier 3 or higher due to the class of medication. He is calling to see if there is an alternate inhaler he could try, as he has not used Symbicort for an extended period of time   Please advise, thank you!

## 2023-06-30 NOTE — Telephone Encounter (Signed)
 Wixella 100 would be cheapest but we don't have samples  Rx is 1 puff bid and pharmacist would need to show him how to use is and plan seeing me p one month rx to be sure it's working ok

## 2023-07-02 NOTE — Telephone Encounter (Signed)
 Unable to speak with patient and unable to leave VM.

## 2023-07-04 DIAGNOSIS — G540 Brachial plexus disorders: Secondary | ICD-10-CM | POA: Diagnosis not present

## 2023-07-04 DIAGNOSIS — I10 Essential (primary) hypertension: Secondary | ICD-10-CM | POA: Diagnosis not present

## 2023-07-04 DIAGNOSIS — S40911A Unspecified superficial injury of right shoulder, initial encounter: Secondary | ICD-10-CM | POA: Diagnosis not present

## 2023-07-04 DIAGNOSIS — S0993XA Unspecified injury of face, initial encounter: Secondary | ICD-10-CM | POA: Diagnosis not present

## 2023-07-04 DIAGNOSIS — S0181XA Laceration without foreign body of other part of head, initial encounter: Secondary | ICD-10-CM | POA: Diagnosis not present

## 2023-07-04 DIAGNOSIS — S299XXA Unspecified injury of thorax, initial encounter: Secondary | ICD-10-CM | POA: Diagnosis not present

## 2023-07-04 DIAGNOSIS — G4489 Other headache syndrome: Secondary | ICD-10-CM | POA: Diagnosis not present

## 2023-07-04 DIAGNOSIS — I2510 Atherosclerotic heart disease of native coronary artery without angina pectoris: Secondary | ICD-10-CM | POA: Diagnosis not present

## 2023-07-04 DIAGNOSIS — S0990XA Unspecified injury of head, initial encounter: Secondary | ICD-10-CM | POA: Diagnosis not present

## 2023-07-04 DIAGNOSIS — M25511 Pain in right shoulder: Secondary | ICD-10-CM | POA: Diagnosis not present

## 2023-07-04 DIAGNOSIS — W19XXXA Unspecified fall, initial encounter: Secondary | ICD-10-CM | POA: Diagnosis not present

## 2023-07-04 DIAGNOSIS — S43014A Anterior dislocation of right humerus, initial encounter: Secondary | ICD-10-CM | POA: Diagnosis not present

## 2023-07-06 DIAGNOSIS — M79642 Pain in left hand: Secondary | ICD-10-CM | POA: Diagnosis not present

## 2023-07-06 DIAGNOSIS — S060X0D Concussion without loss of consciousness, subsequent encounter: Secondary | ICD-10-CM | POA: Diagnosis not present

## 2023-07-06 DIAGNOSIS — S0990XD Unspecified injury of head, subsequent encounter: Secondary | ICD-10-CM | POA: Diagnosis not present

## 2023-07-06 DIAGNOSIS — R0781 Pleurodynia: Secondary | ICD-10-CM | POA: Diagnosis not present

## 2023-07-06 DIAGNOSIS — M25511 Pain in right shoulder: Secondary | ICD-10-CM | POA: Diagnosis not present

## 2023-07-06 DIAGNOSIS — M25562 Pain in left knee: Secondary | ICD-10-CM | POA: Diagnosis not present

## 2023-07-06 DIAGNOSIS — S2232XD Fracture of one rib, left side, subsequent encounter for fracture with routine healing: Secondary | ICD-10-CM | POA: Diagnosis not present

## 2023-07-06 DIAGNOSIS — S0181XD Laceration without foreign body of other part of head, subsequent encounter: Secondary | ICD-10-CM | POA: Diagnosis not present

## 2023-07-09 DIAGNOSIS — S0180XA Unspecified open wound of other part of head, initial encounter: Secondary | ICD-10-CM | POA: Diagnosis not present

## 2023-07-14 DIAGNOSIS — M25511 Pain in right shoulder: Secondary | ICD-10-CM | POA: Diagnosis not present

## 2023-07-17 DIAGNOSIS — S060X9A Concussion with loss of consciousness of unspecified duration, initial encounter: Secondary | ICD-10-CM | POA: Diagnosis not present

## 2023-07-17 DIAGNOSIS — M25511 Pain in right shoulder: Secondary | ICD-10-CM | POA: Diagnosis not present

## 2023-07-27 ENCOUNTER — Telehealth: Payer: Self-pay | Admitting: *Deleted

## 2023-07-27 DIAGNOSIS — M19011 Primary osteoarthritis, right shoulder: Secondary | ICD-10-CM | POA: Diagnosis not present

## 2023-07-27 NOTE — Telephone Encounter (Signed)
   Pre-operative Risk Assessment    Patient Name: Andrew Moran  DOB: [birth year] MRN: [id number]   Date of last office visit: 08/29/2022 Date of next office visit: N/A   Request for Surgical Clearance    Procedure:  RIGHT REVERSE TOTAL SHOULDER ARTHROPLASTY  Date of Surgery:  Clearance TBD                                Surgeon:  DR. KEVIN SUPPLE Surgeon's Group or Practice Name:  [facility] Phone number:  [phone] Fax number:  [phone]   Type of Clearance Requested:   - Medical    Type of Anesthesia:  Not Indicated   Additional requests/questions:    [name]   07/27/2023, 12:03 PM

## 2023-07-28 ENCOUNTER — Telehealth: Payer: Self-pay | Admitting: *Deleted

## 2023-07-28 DIAGNOSIS — R7303 Prediabetes: Secondary | ICD-10-CM | POA: Diagnosis not present

## 2023-07-28 DIAGNOSIS — R2681 Unsteadiness on feet: Secondary | ICD-10-CM | POA: Diagnosis not present

## 2023-07-28 DIAGNOSIS — Z01818 Encounter for other preprocedural examination: Secondary | ICD-10-CM | POA: Diagnosis not present

## 2023-07-28 DIAGNOSIS — S4990XA Unspecified injury of shoulder and upper arm, unspecified arm, initial encounter: Secondary | ICD-10-CM | POA: Diagnosis not present

## 2023-07-28 NOTE — Telephone Encounter (Signed)
 Pt has been scheduled tele preop appt 08/03/23. Med rec and consent are done. Pt said surgeon would like to try plan surgery for next Friday 08/07/23. Pt has no blood thinners to be held.     Patient Consent for Virtual Visit        Andrew Moran has provided verbal consent on 07/28/2023 for a virtual visit (video or telephone).   CONSENT FOR VIRTUAL VISIT FOR:  Andrew Moran  By participating in this virtual visit I agree to the following:  I hereby voluntarily request, consent and authorize [facility] and its employed or contracted physicians, physician assistants, nurse practitioners or other licensed health care professionals (the Practitioner), to provide me with telemedicine health care services (the "Services") as deemed necessary by the treating Practitioner. I acknowledge and consent to receive the Services by the Practitioner via telemedicine. I understand that the telemedicine visit will involve communicating with the Practitioner through live audiovisual communication technology and the disclosure of certain medical information by electronic transmission. I acknowledge that I have been given the opportunity to request an in-person assessment or other available alternative prior to the telemedicine visit and am voluntarily participating in the telemedicine visit.  I understand that I have the right to withhold or withdraw my consent to the use of telemedicine in the course of my care at any time, without affecting my right to future care or treatment, and that the Practitioner or I may terminate the telemedicine visit at any time. I understand that I have the right to inspect all information obtained and/or recorded in the course of the telemedicine visit and may receive copies of available information for a reasonable fee.  I understand that some of the potential risks of receiving the Services via telemedicine include:  Delay or interruption in medical evaluation due to  technological equipment failure or disruption; Information transmitted may not be sufficient (e.g. poor resolution of images) to allow for appropriate medical decision making by the Practitioner; and/or  In rare instances, security protocols could fail, causing a breach of personal health information.  Furthermore, I acknowledge that it is my responsibility to provide information about my medical history, conditions and care that is complete and accurate to the best of my ability. I acknowledge that Practitioner's advice, recommendations, and/or decision may be based on factors not within their control, such as incomplete or inaccurate data provided by me or distortions of diagnostic images or specimens that may result from electronic transmissions. I understand that the practice of medicine is not an exact science and that Practitioner makes no warranties or guarantees regarding treatment outcomes. I acknowledge that a copy of this consent can be made available to me via my patient portal [facility] MyChart), or I can request a printed copy by calling the office of [facility].    I understand that my insurance will be billed for this visit.   I have read or had this consent read to me. I understand the contents of this consent, which adequately explains the benefits and risks of the Services being provided via telemedicine.  I have been provided ample opportunity to ask questions regarding this consent and the Services and have had my questions answered to my satisfaction. I give my informed consent for the services to be provided through the use of telemedicine in my medical care

## 2023-07-28 NOTE — Telephone Encounter (Signed)
   Name: Andrew Moran  DOB: 03-Jul-1945  MRN: [id number]  Primary Cardiologist: Euell Herrlich, MD   Preoperative team, please contact this patient and set up a phone call appointment for further preoperative risk assessment. Please obtain consent and complete medication review. Thank you for your help.  I confirm that guidance regarding antiplatelet and oral anticoagulation therapy has been completed and, if necessary, noted below.  None requested.   I also confirmed the patient resides in the state of [place] . As per [facility] Medical Board telemedicine laws, the patient must reside in the state in which the provider is licensed.   Morey Ar, NP 07/28/2023, 12:01 PM  [facility]

## 2023-07-28 NOTE — Telephone Encounter (Signed)
 Pt has been scheduled tele preop appt 08/03/23. Med rec and consent are done. Pt said surgeon would like to try plan surgery for next Friday 08/07/23. Pt has no blood thinners to be held.

## 2023-07-30 ENCOUNTER — Ambulatory Visit: Admitting: Internal Medicine

## 2023-08-03 ENCOUNTER — Ambulatory Visit: Attending: Cardiovascular Disease | Admitting: Emergency Medicine

## 2023-08-03 DIAGNOSIS — Z01810 Encounter for preprocedural cardiovascular examination: Secondary | ICD-10-CM

## 2023-08-03 NOTE — Progress Notes (Signed)
 Virtual Visit via Telephone Note   Because of Andrew Moran co-morbid illnesses, he is at least at moderate risk for complications without adequate follow up.  This format is felt to be most appropriate for this patient at this time.  Due to technical limitations with video connection (technology), today's appointment will be conducted as an audio only telehealth visit, and Andrew Moran verbally agreed to proceed in this manner.   All issues noted in this document were discussed and addressed.  No physical exam could be performed with this format.  Evaluation Performed:  Preoperative cardiovascular risk assessment _____________   Date:  08/03/2023   Patient ID:  Andrew Moran, DOB 25-Sep-1945, MRN [id number] Patient Location:  Home Provider location:   Office  Primary Care Provider:  Ronna Coho, MD Primary Cardiologist:  Euell Herrlich, MD  Chief Complaint / Patient Profile   78 y.o. y/o male with a h/o dyspnea on exertion, palpitations, DVT, asthma, hypertension, lower extremity edema who is pending right reverse total shoulder arthroplasty on date TBD with [facility] by Dr. Alfredo Ano and presents today for telephonic preoperative cardiovascular risk assessment.  History of Present Illness    Andrew Moran is a 78 y.o. male who presents via audio/video conferencing for a telehealth visit today.  Pt was last seen in cardiology clinic on 08/29/2022 by Bernardo Bridgeman, NP.  At that time Andrew Moran was doing well.  The patient is now pending procedure as outlined above. Since his last visit, he denies chest pain, shortness of breath, lower extremity edema, fatigue, palpitations, melena, hematuria, hemoptysis, diaphoresis, weakness, presyncope, syncope, orthopnea, and PND.  Today patient is doing well overall.  Over the past year he has had no acute cardiovascular concerns or complaints.  He he notes that he does stay relatively active.  He walks often and does work around the  house without any anginal symptoms.  He is quite limited at this moment due to his recent shoulder injury however still is able to walk and be active.  He denies any exertional angina.  He is able to complete greater than 4 METS.  I will have him schedule his 1 year follow-up in office.  Past Medical History    Past Medical History:  Diagnosis Date   Arthritis    Asthma    pulmology-- dr wert--- cough varient versus uacs   GERD (gastroesophageal reflux disease)    per pt takes DGL supplement   History of diverticulitis of colon 2002   managed medically , no surgical intervention   History of DVT of lower extremity 06/27/2019   followed by dr g. Chancy Comber--  completed 3 months xarelto  07/ 2021   History of kidney stones    Renal calculi    left side   Past Surgical History:  Procedure Laterality Date   ANKLE HARDWARE REMOVAL Right 2008   CARPAL TUNNEL RELEASE Bilateral left 1983;  right 1984   CYSTOSCOPY WITH RETROGRADE PYELOGRAM, URETEROSCOPY AND STENT PLACEMENT Left 02/23/2020   Procedure: CYSTOSCOPY WITH RETROGRADE PYELOGRAM, URETEROSCOPY, LITHOTRIPSY, STONE EXTRACTION,  AND STENT PLACEMENT;  Surgeon: Trent Frizzle, MD;  Location: [facility];  Service: Urology;  Laterality: Left;   CYSTOSCOPY/RETROGRADE/URETEROSCOPY/STONE EXTRACTION WITH BASKET Bilateral 10/10/2013   Procedure: CYSTOSCOPY/URETEROSCOPY/STONE EXTRACTION WITH HOLMIUM LASER, bilateral retrograde, bilateral ureteral stents;  Surgeon: Roque Collar, MD;  Location: [facility];  Service: Urology;  Laterality: Bilateral;   EXPLORATORY LAPAROTOMY  1970   EXPLORATORY STOMACH SURGERY FOR TEAR IN STOMACH  WALL   EXTRACORPOREAL SHOCK WAVE LITHOTRIPSY Left 10/06/2019   Procedure: EXTRACORPOREAL SHOCK WAVE LITHOTRIPSY (ESWL);  Surgeon: Roxane Copp, MD;  Location: [facility];  Service: Urology;  Laterality: Left;   EXTRACORPOREAL SHOCK WAVE LITHOTRIPSY  multiple since 2002,  last one 02-28-2013    HOLMIUM LASER APPLICATION Left 10/10/2013   Procedure: HOLMIUM LASER APPLICATION;  Surgeon: Roque Collar, MD;  Location: [facility];  Service: Urology;  Laterality: Left;   KNEE ARTHROSCOPY  02/05/2011   Procedure: ARTHROSCOPY KNEE;  Surgeon: Robbie Chiles [place];  Location: [facility];  Service: Orthopedics;  Laterality: Right;  Right knee arthroscopy partial lateral and medial meniscectomy with condyle debridement   LUMBAR SPINE SURGERY  x2  1995   ORIF ANKLE FRACTURE Right 1988   ROTATOR CUFF REPAIR Bilateral right 02/16/2018;  left 1997   TONSILLECTOMY AND ADENOIDECTOMY  1951   TOTAL KNEE ARTHROPLASTY Left 04/08/2019   Procedure: TOTAL KNEE ARTHROPLASTY;  Surgeon: Genevie Kerns, MD;  Location: [facility];  Service: Orthopedics;  Laterality: Left;  with adductor canal    Allergies  Allergies  Allergen Reactions   Zithromax [Azithromycin] Shortness Of Breath   Betadine  [Povidone Iodine ] Itching    Iodine  drape, if left on too long    Home Medications    Prior to Admission medications   Medication Sig Start Date End Date Taking? Authorizing Provider  albuterol  (VENTOLIN  HFA) 108 (90 Base) MCG/ACT inhaler 2 puffs    [provider]  APPLE CIDER VINEGAR PO Take 5 mLs by mouth daily.    [provider]  atorvastatin (LIPITOR) 20 MG tablet Take 20 mg by mouth at bedtime.    [provider]  methocarbamol  (ROBAXIN ) 500 MG tablet Take 1 tablet by mouth 4 (four) times daily. 08/06/20   [provider]  Multiple Vitamins-Minerals (MULTIVITAMINS THER. W/MINERALS) TABS Take 1 tablet by mouth daily.    [provider]  OVER THE COUNTER MEDICATION Take 2 tablets by mouth at bedtime. Deglycyrrhizinated-DGL    [provider]    Physical Exam    Vital Signs:  Andrew Moran does not have vital signs available for review today.  Given telephonic nature of communication, physical exam is limited. AAOx3. NAD. Normal affect.  Speech and  respirations are unlabored.  Accessory Clinical Findings    None  Assessment & Plan    1.  Preoperative Cardiovascular Risk Assessment: According to the Revised Cardiac Risk Index (RCRI), his Perioperative Risk of Major Cardiac Event is (%): 0.4. His Functional Capacity in METs is: 6.61 according to the Duke Activity Status Index (DASI). Therefore, based on ACC/AHA guidelines, patient would be at acceptable risk for the planned procedure without further cardiovascular testing.   The patient was advised that if he develops new symptoms prior to surgery to contact our office to arrange for a follow-up visit, and he verbalized understanding.  A copy of this note will be routed to requesting surgeon.  Time:   Today, I have spent 8 minutes with the patient with telehealth technology discussing medical history, symptoms, and management plan.     Ava Boatman, NP  08/03/2023, 9:51 AM

## 2023-08-05 ENCOUNTER — Encounter: Payer: Self-pay | Admitting: Internal Medicine

## 2023-08-07 ENCOUNTER — Telehealth: Payer: Self-pay | Admitting: Internal Medicine

## 2023-08-07 NOTE — Telephone Encounter (Signed)
 Pt called in stating he received a notification that he was a no show for his appt on 08/03/23. He was present for his televist with Renford Cartwright, NP and would like this changed to reflect on his account. Please advise.

## 2023-08-11 DIAGNOSIS — G8918 Other acute postprocedural pain: Secondary | ICD-10-CM | POA: Diagnosis not present

## 2023-08-11 DIAGNOSIS — M75121 Complete rotator cuff tear or rupture of right shoulder, not specified as traumatic: Secondary | ICD-10-CM | POA: Diagnosis not present

## 2023-08-17 ENCOUNTER — Emergency Department (HOSPITAL_BASED_OUTPATIENT_CLINIC_OR_DEPARTMENT_OTHER): Admitting: Radiology

## 2023-08-17 ENCOUNTER — Observation Stay (HOSPITAL_BASED_OUTPATIENT_CLINIC_OR_DEPARTMENT_OTHER)
Admission: EM | Admit: 2023-08-17 | Discharge: 2023-08-18 | Disposition: A | Discharge status: Home / Self Care | Attending: Internal Medicine | Admitting: Internal Medicine

## 2023-08-17 ENCOUNTER — Other Ambulatory Visit: Payer: Self-pay

## 2023-08-17 ENCOUNTER — Encounter (HOSPITAL_BASED_OUTPATIENT_CLINIC_OR_DEPARTMENT_OTHER): Payer: Self-pay

## 2023-08-17 ENCOUNTER — Emergency Department (HOSPITAL_BASED_OUTPATIENT_CLINIC_OR_DEPARTMENT_OTHER)

## 2023-08-17 DIAGNOSIS — F1090 Alcohol use, unspecified, uncomplicated: Secondary | ICD-10-CM | POA: Diagnosis not present

## 2023-08-17 DIAGNOSIS — Z86718 Personal history of other venous thrombosis and embolism: Secondary | ICD-10-CM | POA: Diagnosis not present

## 2023-08-17 DIAGNOSIS — R0602 Shortness of breath: Secondary | ICD-10-CM | POA: Diagnosis not present

## 2023-08-17 DIAGNOSIS — L03313 Cellulitis of chest wall: Secondary | ICD-10-CM | POA: Diagnosis not present

## 2023-08-17 DIAGNOSIS — J45909 Unspecified asthma, uncomplicated: Secondary | ICD-10-CM | POA: Diagnosis not present

## 2023-08-17 DIAGNOSIS — I2510 Atherosclerotic heart disease of native coronary artery without angina pectoris: Secondary | ICD-10-CM | POA: Diagnosis not present

## 2023-08-17 DIAGNOSIS — M542 Cervicalgia: Secondary | ICD-10-CM | POA: Diagnosis present

## 2023-08-17 DIAGNOSIS — Z79899 Other long term (current) drug therapy: Secondary | ICD-10-CM | POA: Insufficient documentation

## 2023-08-17 DIAGNOSIS — M62838 Other muscle spasm: Secondary | ICD-10-CM | POA: Diagnosis not present

## 2023-08-17 DIAGNOSIS — L0390 Cellulitis, unspecified: Secondary | ICD-10-CM | POA: Diagnosis present

## 2023-08-17 DIAGNOSIS — M25511 Pain in right shoulder: Secondary | ICD-10-CM | POA: Diagnosis not present

## 2023-08-17 DIAGNOSIS — R918 Other nonspecific abnormal finding of lung field: Secondary | ICD-10-CM | POA: Diagnosis not present

## 2023-08-17 DIAGNOSIS — R0789 Other chest pain: Secondary | ICD-10-CM | POA: Diagnosis not present

## 2023-08-17 DIAGNOSIS — Z96611 Presence of right artificial shoulder joint: Secondary | ICD-10-CM | POA: Diagnosis not present

## 2023-08-17 LAB — CBC
HCT: 37.2 % — ABNORMAL LOW (ref 39.0–52.0)
Hemoglobin: 12.2 g/dL — ABNORMAL LOW (ref 13.0–17.0)
MCH: 29.7 pg (ref 26.0–34.0)
MCHC: 32.8 g/dL (ref 30.0–36.0)
MCV: 90.5 fL (ref 80.0–100.0)
Platelets: 302 10*3/uL (ref 150–400)
RBC: 4.11 MIL/uL — ABNORMAL LOW (ref 4.22–5.81)
RDW: 13 % (ref 11.5–15.5)
WBC: 9.3 10*3/uL (ref 4.0–10.5)
nRBC: 0 % (ref 0.0–0.2)

## 2023-08-17 LAB — BASIC METABOLIC PANEL WITH GFR
Anion gap: 14 (ref 5–15)
BUN: 14 mg/dL (ref 8–23)
CO2: 23 mmol/L (ref 22–32)
Calcium: 9.5 mg/dL (ref 8.9–10.3)
Chloride: 103 mmol/L (ref 98–111)
Creatinine, Ser: 0.88 mg/dL (ref 0.61–1.24)
GFR, Estimated: >60 mL/min (ref >60)
Glucose, Bld: 126 mg/dL — ABNORMAL HIGH (ref 70–99)
Potassium: 3.9 mmol/L (ref 3.5–5.1)
Sodium: 140 mmol/L (ref 135–145)

## 2023-08-17 LAB — PROTIME-INR
INR: 1 (ref 0.8–1.2)
Prothrombin Time: 13.1 s (ref 11.4–15.2)

## 2023-08-17 MED ORDER — FENTANYL CITRATE PF 50 MCG/ML IJ SOSY
50.0000 ug | PREFILLED_SYRINGE | Freq: Once | INTRAMUSCULAR | Status: AC
  Administered 2023-08-17: 50 ug via INTRAVENOUS
  Filled 2023-08-17: qty 1

## 2023-08-17 MED ORDER — SODIUM CHLORIDE 0.9 % IV SOLN
2.0000 g | Freq: Once | INTRAVENOUS | Status: AC
  Administered 2023-08-17: 2 g via INTRAVENOUS
  Filled 2023-08-17: qty 12.5

## 2023-08-17 MED ORDER — LACTATED RINGERS IV BOLUS
1000.0000 mL | Freq: Once | INTRAVENOUS | Status: AC
  Administered 2023-08-17: 1000 mL via INTRAVENOUS

## 2023-08-17 MED ORDER — IOHEXOL 350 MG/ML SOLN
75.0000 mL | Freq: Once | INTRAVENOUS | Status: AC | PRN
  Administered 2023-08-17: 100 mL via INTRAVENOUS

## 2023-08-17 MED ORDER — VANCOMYCIN HCL IN DEXTROSE 1-5 GM/200ML-% IV SOLN
1000.0000 mg | Freq: Once | INTRAVENOUS | Status: AC
  Administered 2023-08-18: 1000 mg via INTRAVENOUS
  Filled 2023-08-17: qty 200

## 2023-08-17 MED ORDER — CLINDAMYCIN PHOSPHATE 600 MG/50ML IV SOLN
600.0000 mg | Freq: Once | INTRAVENOUS | Status: AC
  Administered 2023-08-18: 600 mg via INTRAVENOUS
  Filled 2023-08-17: qty 50

## 2023-08-17 NOTE — ED Notes (Signed)
 Multiple attempts made to obtain [facility] were unsuccessful. MD aware. Abx started.

## 2023-08-17 NOTE — ED Notes (Signed)
 Called [facility] to transport patient to [facility] rm# [phone]

## 2023-08-17 NOTE — ED Notes (Deleted)
Called [facility] to transport patient to [facility] [phone]

## 2023-08-17 NOTE — ED Triage Notes (Signed)
 Pt sent by PCP for possible blood clot or possible PNA. Pt had shoulder surgery last week. Pt reports increased SOB last night and increased shoulder pain. Pt denies any productive cough. Pt has hx of blood clots, not on blood thinners.

## 2023-08-17 NOTE — ED Provider Notes (Signed)
 [facility] Provider Note   CSN: [id number] Arrival date & time: 08/17/23  1746     History  Chief Complaint  Patient presents with   Shortness of Breath    Andrew Moran is a 78 y.o. male.   Shortness of Breath Associated symptoms: chest pain      78 year old male presenting to the emergency department with right-sided chest wall pain.  The patient was sent for possible blood clot or pneumonia by his orthopedic surgeon.  He is on postop day 6 from a reverse shoulder with [facility].  He states that over the past 2 to 3 days he has had worsening sharp pain along the right side of his chest radiating up to his right shoulder.  He has hematoma and postoperative pain which has been present for the last 6 days however this pain that he is experiencing is different.  He has had fevers at home.  He denies any cough, is not on anticoagulation.  Home Medications Prior to Admission medications   Medication Sig Start Date End Date Taking? Authorizing Provider  albuterol  (VENTOLIN  HFA) 108 (90 Base) MCG/ACT inhaler 2 puffs    [provider]  APPLE CIDER VINEGAR PO Take 5 mLs by mouth daily.    [provider]  atorvastatin (LIPITOR) 20 MG tablet Take 20 mg by mouth at bedtime.    [provider]  methocarbamol  (ROBAXIN ) 500 MG tablet Take 1 tablet by mouth 4 (four) times daily. 08/06/20   [provider]  Multiple Vitamins-Minerals (MULTIVITAMINS THER. W/MINERALS) TABS Take 1 tablet by mouth daily.    [provider]  OVER THE COUNTER MEDICATION Take 2 tablets by mouth at bedtime. Deglycyrrhizinated-DGL    [provider]      Allergies    Zithromax [azithromycin] and Betadine  [povidone iodine ]    Review of Systems   Review of Systems  Respiratory:  Positive for shortness of breath.   Cardiovascular:  Positive for chest pain.  Musculoskeletal:  Positive for  arthralgias and joint swelling.  All other systems reviewed and are negative.   Physical Exam Updated Vital Signs BP (!) 128/96   Pulse 93   Temp 99.7 F (37.6 C) (Rectal)   Resp 18   Ht 5\' 6"  (1.676 m)   Wt 77.1 kg   SpO2 99%   BMI 27.44 kg/m  Physical Exam Vitals and nursing note reviewed.  Constitutional:      General: He is not in acute distress.    Appearance: He is well-developed.  HENT:     Head: Normocephalic and atraumatic.  Eyes:     Conjunctiva/sclera: Conjunctivae normal.  Cardiovascular:     Rate and Rhythm: Normal rate and regular rhythm.     Heart sounds: No murmur heard. Pulmonary:     Effort: Pulmonary effort is normal. No respiratory distress.     Breath sounds: Normal breath sounds.  Abdominal:     Palpations: Abdomen is soft.     Tenderness: There is no abdominal tenderness.  Musculoskeletal:        General: No swelling.     Cervical back: Neck supple.     Comments: Post-operative site without purulence, appears to be healing well, surround hematoma of tissues extending down the chest wall and right arm. Mild warmth, slight erythema along the chest wall, no crepitus, significant tenderness to palpation along the chest wall  Skin:    General: Skin is warm  and dry.     Capillary Refill: Capillary refill takes less than 2 seconds.  Neurological:     Mental Status: He is alert.  Psychiatric:        Mood and Affect: Mood normal.     ED Results / Procedures / Treatments   Labs (all labs ordered are listed, but only abnormal results are displayed) Labs Reviewed  BASIC METABOLIC PANEL WITH GFR - Abnormal; Notable for the following components:      Result Value   Glucose, Bld 126 (*)    All other components within normal limits  CBC - Abnormal; Notable for the following components:   RBC 4.11 (*)    Hemoglobin 12.2 (*)    HCT 37.2 (*)    All other components within normal limits  PROTIME-INR    EKG EKG Interpretation Date/Time:  Monday August 17 2023 18:02:03 EDT Ventricular Rate:  109 PR Interval:  128 QRS Duration:  99 QT Interval:  332 QTC Calculation: 447 R Axis:   236  Text Interpretation: Sinus tachycardia Inferior infarct, old Confirmed by Rosealee Concha ([id number]) on 08/17/2023 7:28:05 PM  Radiology CT Angio Chest PE W and/or Wo Contrast Addendum Date: 08/17/2023 ADDENDUM REPORT: 08/17/2023 22:03 ADDENDUM: Given additional clinical history of redness and swelling and tenderness in the right shoulder in chest wall, described soft tissue changes of infiltration and gas could indicate infection. No loculated collections are identified. Note that the left shoulder is incompletely included within the field of view of the study and streak artifact from the surgical hardware may limit evaluation. Electronically Signed   By: Boyce Byes M.D.   On: 08/17/2023 22:03   Result Date: 08/17/2023 CLINICAL DATA:  Pulmonary embolus suspected with high probability. Shoulder surgery last week. Increasing shortness of breath and shoulder pain. EXAM: CT ANGIOGRAPHY CHEST WITH CONTRAST TECHNIQUE: Multidetector CT imaging of the chest was performed using the standard protocol during bolus administration of intravenous contrast. Multiplanar CT image reconstructions and MIPs were obtained to evaluate the vascular anatomy. RADIATION DOSE REDUCTION: This exam was performed according to the departmental dose-optimization program which includes automated exposure control, adjustment of the mA and/or kV according to patient size and/or use of iterative reconstruction technique. CONTRAST:  OMNIPAQUE  IOHEXOL  350 MG/ML SOLN COMPARISON:  Chest radiograph 08/17/2023 FINDINGS: Cardiovascular: Technically adequate study with moderately good opacification of the central and segmental pulmonary arteries. Moderate motion artifact. As visualized, no focal filling defects are identified. No evidence of significant pulmonary embolus. Normal heart size. No pericardial  effusions. Normal caliber thoracic aorta. No aortic dissection. Calcification in the coronary arteries. Mediastinum/Nodes: Esophagus is decompressed. No significant lymphadenopathy. Thyroid  gland is unremarkable. Lungs/Pleura: Motion artifact limits examination. Mild linear atelectasis or scarring in the lung bases. No airspace disease. No pleural effusion or pneumothorax. Upper Abdomen: No acute abnormality. Musculoskeletal: Degenerative changes in the spine. Postoperative right shoulder arthroplasty. Soft tissue infiltration and soft tissue gas anterior to the right shoulder is likely postoperative. No loculated collections are identified. Review of the MIP images confirms the above findings. IMPRESSION: 1. No evidence of significant pulmonary embolus. 2. Mild atelectasis or scarring in the lung bases. 3. Postoperative changes in the right shoulder. Electronically Signed: By: Boyce Byes M.D. On: 08/17/2023 20:50   DG Chest 2 View Result Date: 08/17/2023 CLINICAL DATA:  Shortness of breath EXAM: CHEST - 2 VIEW COMPARISON:  Chest x-ray 04/22/2021 FINDINGS: The heart size and mediastinal contours are within normal limits. Both lungs are clear. Right shoulder  arthroplasty is present. No acute fractures are seen. IMPRESSION: No active cardiopulmonary disease. Electronically Signed   By: Tyron Gallon M.D.   On: 08/17/2023 19:02    Procedures Procedures    Medications Ordered in ED Medications  vancomycin (VANCOCIN) IVPB 1000 mg/200 mL premix (1,000 mg Intravenous New Bag/Given 08/18/23 0004)  clindamycin (CLEOCIN) IVPB 600 mg (has no administration in time range)  lactated ringers  bolus 1,000 mL (0 mLs Intravenous Stopped 08/17/23 2116)  fentaNYL  (SUBLIMAZE ) injection 50 mcg (50 mcg Intravenous Given 08/17/23 1957)  iohexol  (OMNIPAQUE ) 350 MG/ML injection 75 mL (100 mLs Intravenous Contrast Given 08/17/23 2021)  ceFEPIme (MAXIPIME) 2 g in sodium chloride  0.9 % 100 mL IVPB (2 g Intravenous New Bag/Given  08/17/23 2323)  fentaNYL  (SUBLIMAZE ) injection 50 mcg (50 mcg Intravenous Given 08/17/23 2253)    ED Course/ Medical Decision Making/ A&P                                 Medical Decision Making Amount and/or Complexity of Data Reviewed Labs: ordered. Radiology: ordered.  Risk Prescription drug management. Decision regarding hospitalization.    78 year old male presenting to the emergency department with right-sided chest wall pain.  The patient was sent for possible blood clot or pneumonia by his orthopedic surgeon.  He is on postop day 6 from a reverse shoulder with Dr[facility].  He states that over the past 2 to 3 days he has had worsening sharp pain along the right side of his chest radiating up to his right shoulder.  He has hematoma and postoperative pain which has been present for the last 6 days however this pain that he is experiencing is different.  He has had fevers at home.  He denies any cough, is not on anticoagulation.  On arrival, the patient was afebrile, borderline elevated temperature, 41F, rectal temp 99.7, tachycardic heart rate 108, not tachypneic RR 20, BP 138/73, saturating 97% on room air.  Patient on exam had significant chest wall tenderness to palpation, hematoma noted along the chest wall, extending down the right arm, no palpable crepitus, slight erythema and warmth noted.  Surgical site without drainage.  Patient tenderness along the chest wall out of proportion to what would be expected almost 1 week out from surgery, meeting SIRS criteria, fever noted at home, tachycardic on arrival with heart rates in the 90s, mildly tachypneic.  Considered developing postoperative cellulitis, considered developing sepsis.  Lower concern for abscess, infection of his operative site. Considered PE, PTX, PNA.   CTA PE: IMPRESSION:  1. No evidence of significant pulmonary embolus.  2. Mild atelectasis or scarring in the lung bases.  3. Postoperative changes in  the right shoulder.   Labs: CBC without a leukocytosis, mild anemia 12.2, INR normal, BMP unremarkable.  Discussed with on-call radiology, addendum added to CT PE study: ADDENDUM: Given additional clinical history of redness and swelling and tenderness in the right shoulder in chest wall, described soft tissue changes of infiltration and gas could indicate infection. No loculated collections are identified. Note that the left shoulder is incompletely included within the field of view of the study and streak artifact from the surgical hardware may limit evaluation.    Pt covered with Vancomycin, Cefepime, and Clindamycin. Discussed with on-call orthopedics, spoke with Dr. Bernard Brick of on-call orthopedics [facility]).  Given the patient's acute worsening pain over the past few days, potential developing cellulitis in the  region of his previous surgery, will cover the patient with broad-spectrum antibiotics and admit the patient. Low concern for necrotizing soft tissue infection of the chest wall as post operative soft tissues gas would be expected per orthopedics. Ortho will see the patient in consultation inpatient. Dr. Brock Canner accepted the patient in admission.      Final Clinical Impression(s) / ED Diagnoses Final diagnoses:  Cellulitis of chest wall    Rx / DC Orders ED Discharge Orders     None         Rosealee Concha, MD 08/18/23 [id number]

## 2023-08-17 NOTE — Progress Notes (Signed)
 Hospitalist Transfer Note:    Nursing staff, Please call [facility] Admits & Consults System-Wide number on Amion [phone]) as soon as patient's arrival, so appropriate admitting provider can evaluate the pt.   Transferring facility: [facility] Requesting provider: Dr. Adrain Alar (EDP at [facility]) Reason for transfer: admission for further evaluation and management of right-sided chest pain, with concern for potential chest wall cellulitis.     78 year old male who is postop day #6, status post right reverse shoulder arthroplasty w/ Dr. Alfredo Ano of [facility], who presented to [facility] ED complaining of right-sided chest and shoulder discomfort.  He notes that this has been associated with objective fever over the last 2 days.  On exam by EDP, he is reported to [facility] evidence of extensive right chest ecchymosis.   Vital signs in the ED were notable for the following: Temperature max 99.7; heart rates in the 90s to low 100s; systolic blood pressures in the 120s to 150s; respiratory rate 17-25 and oxygen saturation 95 to 100% on room air.  EKG reported to show no evidence of acute ischemic changes.  Labs were notable for CBC reflecting white blood cell count of 9300, hemoglobin 12.2.  Imaging notable for CTA chest which shows evidence of subcutaneous air over the right chest, given the absence of any evidence of pneumothorax, pneumomediastinum, nor any evidence of PE.   EDP d/w Dr. Bernard Brick, who is on-call for [facility], who conveyed that subcutaneous air over the chest is a relatively common consequence of the patient's recent right shoulder procedure. Dr. Bernard Brick requested [facility] admission to [facility], where Dr. Alfredo Ano will be on-call tomorrow (6/3) and will formally consult.   EDP conveys that the patient's right chest is tender to the touch, with increased warmth, but that the degree of ecchymosis present is complicating further evaluation for physical exam appearance of cellulitis.   In the setting a differential  includes chest wall cellulitis, this patient is reported 2 days of objective fever, tachycardia, the patient received broad-spectrum IV antibiotics at [facility], including vancomycin, cefepime, and clindamycin.  Subsequently, I accepted this patient for transfer for inpatient admission to a med-tele bed at [facility] for further work-up and management of the above.      Camelia Cavalier, DO Hospitalist

## 2023-08-18 DIAGNOSIS — M25511 Pain in right shoulder: Secondary | ICD-10-CM | POA: Diagnosis not present

## 2023-08-18 DIAGNOSIS — R062 Wheezing: Secondary | ICD-10-CM | POA: Diagnosis not present

## 2023-08-18 DIAGNOSIS — M62838 Other muscle spasm: Secondary | ICD-10-CM

## 2023-08-18 DIAGNOSIS — I10 Essential (primary) hypertension: Secondary | ICD-10-CM | POA: Diagnosis not present

## 2023-08-18 DIAGNOSIS — L0390 Cellulitis, unspecified: Secondary | ICD-10-CM | POA: Diagnosis not present

## 2023-08-18 DIAGNOSIS — L03313 Cellulitis of chest wall: Secondary | ICD-10-CM | POA: Diagnosis present

## 2023-08-18 DIAGNOSIS — Z789 Other specified health status: Secondary | ICD-10-CM | POA: Diagnosis not present

## 2023-08-18 MED ORDER — CYCLOBENZAPRINE HCL 10 MG PO TABS: 10.0000 mg | ORAL_TABLET | Freq: Four times a day (QID) | ORAL | Status: DC | PRN

## 2023-08-18 MED ORDER — ALBUTEROL SULFATE (2.5 MG/3ML) 0.083% IN NEBU
INHALATION_SOLUTION | RESPIRATORY_TRACT | Status: AC
  Filled 2023-08-18: qty 3

## 2023-08-18 MED ORDER — OXYCODONE-ACETAMINOPHEN 5-325 MG PO TABS: 1.0000 | ORAL_TABLET | Freq: Four times a day (QID) | ORAL | Status: DC | PRN

## 2023-08-18 MED ORDER — ALBUTEROL SULFATE HFA 108 (90 BASE) MCG/ACT IN AERS
INHALATION_SPRAY | RESPIRATORY_TRACT | Status: AC
  Administered 2023-08-18: 2 via RESPIRATORY_TRACT
  Filled 2023-08-18: qty 6.7

## 2023-08-18 MED ORDER — ALBUTEROL SULFATE HFA 108 (90 BASE) MCG/ACT IN AERS
2.0000 | INHALATION_SPRAY | Freq: Four times a day (QID) | RESPIRATORY_TRACT | Status: DC
  Administered 2023-08-18: 2 via RESPIRATORY_TRACT

## 2023-08-18 MED ORDER — ALBUTEROL SULFATE (2.5 MG/3ML) 0.083% IN NEBU
2.5000 mg | INHALATION_SOLUTION | RESPIRATORY_TRACT | Status: DC | PRN
  Administered 2023-08-18: 2.5 mg via RESPIRATORY_TRACT

## 2023-08-18 MED ORDER — FENTANYL CITRATE PF 50 MCG/ML IJ SOSY
25.0000 ug | PREFILLED_SYRINGE | INTRAMUSCULAR | Status: DC | PRN
  Administered 2023-08-18: 25 ug via INTRAVENOUS
  Filled 2023-08-18: qty 1

## 2023-08-18 MED ORDER — ATORVASTATIN CALCIUM 20 MG PO TABS: 20.0000 mg | ORAL_TABLET | Freq: Every day | ORAL | Status: DC

## 2023-08-18 NOTE — ED Notes (Signed)
[facility] at bedside to transport patient. 

## 2023-08-18 NOTE — Evaluation (Signed)
 Physical Therapy Brief Evaluation and Discharge Note Patient Details Name: Andrew Moran MRN: [id number] DOB: [birth year] Today's Date: 08/18/2023   History of Present Illness  78 yo male admitted with R shoulder pain, dyspnea. Recent R r TSA by Dr Alfredo Ano as outpatient. Hx of DVT, asthma, OA. fall, concussion  Clinical Impression  Pt is Mod Ind with mobility. O2 97% on RA, HR 111 bpm. No acute PT needs. Nursing and/or mobility team can assist with any further mobility or O2 assessment needs. Will sign off. 1x eval. Recommend pt continue to follow post surgical recommendations/instructions per surgeon.        PT Assessment    Assistance Needed at Discharge       Equipment Recommendations None recommended by PT  Recommendations for Other Services       Precautions/Restrictions Precautions Precautions: Shoulder Shoulder Interventions: Shoulder sling/immobilizer Restrictions Other Position/Activity Restrictions: R UE NWB        Mobility  Bed Mobility          Transfers Overall transfer level: Modified independent                      Ambulation/Gait Ambulation/Gait assistance: Modified independent (Device/Increase time) Gait Distance (Feet): 150 Feet Assistive device: None     General Gait Details: Intermittent coughing. Pt able to converse throughout distance. Denied dyspnea. O2 97% on RA, HR 111 bpm. Tolerated distance well.  Home Activity Instructions    Stairs            Modified Rankin (Stroke Patients Only)        Balance                          Pertinent Vitals/Pain   Pain Assessment Pain Assessment: No/denies pain     Home Living   Living Arrangements: Spouse/significant other       Home Equipment: Rolling Walker (2 wheels);Cane - single point        Prior Function        UE/LE Assessment               Communication   Communication Communication: No apparent difficulties     Cognition          General Comments      Exercises     Assessment/Plan    PT Problem List         PT Visit Diagnosis      No Skilled PT Patient is modified independent with all activity/mobility   Co-evaluation                AMPAC 6 Clicks Help needed turning from your back to your side while in a flat bed without using bedrails?: None Help needed moving from lying on your back to sitting on the side of a flat bed without using bedrails?: None Help needed moving to and from a bed to a chair (including a wheelchair)?: None Help needed standing up from a chair using your arms (e.g., wheelchair or bedside chair)?: None Help needed to walk in hospital room?: None Help needed climbing 3-5 steps with a railing? : None 6 Click Score: 24      End of Session Equipment Utilized During Treatment: Gait belt Activity Tolerance: Patient tolerated treatment well Patient left: in bed;with call bell/phone within reach         Time: 1155-1208 PT Time Calculation (min) (ACUTE ONLY): 13 min  Charges:  PT Evaluation $PT Eval Low Complexity: 1 Low          Tanda Falter, PT Acute Rehabilitation  Office: [phone]

## 2023-08-18 NOTE — Care Management CC44 (Signed)
 Condition Code 44 Documentation Completed  Patient Details  Name: Andrew Moran MRN: [id number] Date of Birth: [birth year]   Condition Code 44 given:  Yes Patient signature on Condition Code 44 notice:  Yes Documentation of 2 MD's agreement:  Yes Code 44 added to claim:  Yes    Tessie Fila, RN 08/18/2023, 3:15 PM

## 2023-08-18 NOTE — ED Notes (Signed)
 Patient requesting albuterol  inhaler prior to [facility] transport. Patient takes inhaler at home. Inhaler administered prior to departure from [facility].

## 2023-08-18 NOTE — Consult Note (Addendum)
 Reason for Consult: Status post right shoulder reverse arthroplasty with acute onset right sided anterior superior chest pain approximately 1 week out from surgery. Referring Physician: Glo Larch MD  HPI: Andrew Moran is an 78 y.o. male well-known to our practice status post a right shoulder reverse arthroplasty performed 08/11/2023.  He had an uneventful initial postop recovery until this past Monday morning at which time he noted the acute onset of severe anterior superior chest pain localized about the medial clavicle and supraclavicular fossa.  He does have some underlying pulmonary disease but denied any acute shortness of breath.  Patient does report remote history of DVT.  He was brought to our office yesterday afternoon where evaluation demonstrated broad resolving ecchymosis as would be expected postop.  His surgical incision was healing well without drainage erythema or induration or fluctuance.  He localizes pain in the anterior superior chest wall about the medial clavicle as well as in the anterior strap muscles and supraclavicular fossa.  This pain pattern was very atypical for her usual postoperative shoulder pain and given his history of DVT he was referred to the ED for rule out PE versus pneumonia.  Subsequent workup at [facility] included a CT angiogram which was negative for acute PE or pneumonia.  There was some tentative concern regarding fluid and small loculations of gas for possible infection.  Certainly these are findings that would be expected at this early stage postop.  Patient was subsequently transferred to [facility] for further valuation.  Did receive initial doses of antibiotics in the ED.  Past Medical History:  Diagnosis Date   Arthritis    Asthma    pulmology-- dr wert--- cough varient versus uacs   GERD (gastroesophageal reflux disease)    per pt takes DGL supplement   History of diverticulitis of colon 2002   managed medically , no surgical intervention    History of DVT of lower extremity 06/27/2019   followed by dr g. Chancy Comber--  completed 3 months xarelto  07/ 2021   History of kidney stones    Renal calculi    left side    Past Surgical History:  Procedure Laterality Date   ANKLE HARDWARE REMOVAL Right 2008   CARPAL TUNNEL RELEASE Bilateral left 1983;  right 1984   CYSTOSCOPY WITH RETROGRADE PYELOGRAM, URETEROSCOPY AND STENT PLACEMENT Left 02/23/2020   Procedure: CYSTOSCOPY WITH RETROGRADE PYELOGRAM, URETEROSCOPY, LITHOTRIPSY, STONE EXTRACTION,  AND STENT PLACEMENT;  Surgeon: Trent Frizzle, MD;  Location: [facility];  Service: Urology;  Laterality: Left;   CYSTOSCOPY/RETROGRADE/URETEROSCOPY/STONE EXTRACTION WITH BASKET Bilateral 10/10/2013   Procedure: CYSTOSCOPY/URETEROSCOPY/STONE EXTRACTION WITH HOLMIUM LASER, bilateral retrograde, bilateral ureteral stents;  Surgeon: Roque Collar, MD;  Location: [facility];  Service: Urology;  Laterality: Bilateral;   EXPLORATORY LAPAROTOMY  1970   EXPLORATORY STOMACH SURGERY FOR TEAR IN STOMACH WALL   EXTRACORPOREAL SHOCK WAVE LITHOTRIPSY Left 10/06/2019   Procedure: EXTRACORPOREAL SHOCK WAVE LITHOTRIPSY (ESWL);  Surgeon: Roxane Copp, MD;  Location: [facility];  Service: Urology;  Laterality: Left;   EXTRACORPOREAL SHOCK WAVE LITHOTRIPSY  multiple since 2002,  last one 02-28-2013   HOLMIUM LASER APPLICATION Left 10/10/2013   Procedure: HOLMIUM LASER APPLICATION;  Surgeon: Roque Collar, MD;  Location: [facility];  Service: Urology;  Laterality: Left;   KNEE ARTHROSCOPY  02/05/2011   Procedure: ARTHROSCOPY KNEE;  Surgeon: Robbie Chiles [place];  Location: [facility];  Service: Orthopedics;  Laterality: Right;  Right knee arthroscopy partial lateral and medial  meniscectomy with condyle debridement   LUMBAR SPINE SURGERY  x2  1995   ORIF ANKLE FRACTURE Right 1988   ROTATOR CUFF REPAIR Bilateral right 02/16/2018;  left 1997   TONSILLECTOMY AND ADENOIDECTOMY  1951    TOTAL KNEE ARTHROPLASTY Left 04/08/2019   Procedure: TOTAL KNEE ARTHROPLASTY;  Surgeon: Genevie Kerns, MD;  Location: [facility];  Service: Orthopedics;  Laterality: Left;  with adductor canal    Family History  Problem Relation Age of Onset   Heart attack Father     Social History:  reports that he has never smoked. He has never used smokeless tobacco. He reports current alcohol use. He reports that he does not use drugs.  Allergies:  Allergies  Allergen Reactions   Zithromax [Azithromycin] Shortness Of Breath   Betadine  [Povidone Iodine ] Itching    Iodine  drape, if left on too long   Wound Dressing Adhesive Other (See Comments)    Irritation     Medications: I have reviewed the patient's current medications.  Results for orders placed or performed during the hospital encounter of 08/17/23 (from the past 48 hours)  Basic metabolic panel     Status: Abnormal   Collection Time: 08/17/23  6:05 PM  Result Value Ref Range   Sodium 140 135 - 145 mmol/L   Potassium 3.9 3.5 - 5.1 mmol/L   Chloride 103 98 - 111 mmol/L   CO2 23 22 - 32 mmol/L   Glucose, Bld 126 (H) 70 - 99 mg/dL    Comment: Glucose reference range applies only to samples taken after fasting for at least 8 hours.   BUN 14 8 - 23 mg/dL   Creatinine, Ser 9.81 0.61 - 1.24 mg/dL   Calcium 9.5 8.9 - 19.1 mg/dL   GFR, Estimated >47 >82 mL/min    Comment: (NOTE) Calculated using the CKD-EPI Creatinine Equation (2021)    Anion gap 14 5 - 15    Comment: Performed at [facility], [address] [place], [place] [address]  CBC     Status: Abnormal   Collection Time: 08/17/23  6:05 PM  Result Value Ref Range   WBC 9.3 4.0 - 10.5 K/uL   RBC 4.11 (L) 4.22 - 5.81 MIL/uL   Hemoglobin 12.2 (L) 13.0 - 17.0 g/dL   HCT 30.8 (L) 65.7 - 84.6 %   MCV 90.5 80.0 - 100.0 fL   MCH 29.7 26.0 - 34.0 pg   MCHC 32.8 30.0 - 36.0 g/dL   RDW 96.2 95.2 - 84.1 %   Platelets 302 150 - 400 K/uL   nRBC 0.0 0.0 - 0.2 %     Comment: Performed at [facility], [address] [place], [place] [address]  Protime-INR (order if Patient is taking Coumadin / Warfarin)     Status: None   Collection Time: 08/17/23  6:05 PM  Result Value Ref Range   Prothrombin Time 13.1 11.4 - 15.2 seconds   INR 1.0 0.8 - 1.2    Comment: (NOTE) INR goal varies based on device and disease states. Performed at [facility], [address] [place], [place] [address]     CT Angio Chest PE W and/or Wo Contrast Addendum Date: 08/17/2023 ADDENDUM REPORT: 08/17/2023 22:03 ADDENDUM: Given additional clinical history of redness and swelling and tenderness in the right shoulder in chest wall, described soft tissue changes of infiltration and gas could indicate infection. No loculated collections are identified. Note that the left shoulder is incompletely included within the field  of view of the study and streak artifact from the surgical hardware may limit evaluation. Electronically Signed   By: Boyce Byes M.D.   On: 08/17/2023 22:03   Result Date: 08/17/2023 CLINICAL DATA:  Pulmonary embolus suspected with high probability. Shoulder surgery last week. Increasing shortness of breath and shoulder pain. EXAM: CT ANGIOGRAPHY CHEST WITH CONTRAST TECHNIQUE: Multidetector CT imaging of the chest was performed using the standard protocol during bolus administration of intravenous contrast. Multiplanar CT image reconstructions and MIPs were obtained to evaluate the vascular anatomy. RADIATION DOSE REDUCTION: This exam was performed according to the departmental dose-optimization program which includes automated exposure control, adjustment of the mA and/or kV according to patient size and/or use of iterative reconstruction technique. CONTRAST:  OMNIPAQUE  IOHEXOL  350 MG/ML SOLN COMPARISON:  Chest radiograph 08/17/2023 FINDINGS: Cardiovascular: Technically adequate study with moderately good opacification of the  central and segmental pulmonary arteries. Moderate motion artifact. As visualized, no focal filling defects are identified. No evidence of significant pulmonary embolus. Normal heart size. No pericardial effusions. Normal caliber thoracic aorta. No aortic dissection. Calcification in the coronary arteries. Mediastinum/Nodes: Esophagus is decompressed. No significant lymphadenopathy. Thyroid  gland is unremarkable. Lungs/Pleura: Motion artifact limits examination. Mild linear atelectasis or scarring in the lung bases. No airspace disease. No pleural effusion or pneumothorax. Upper Abdomen: No acute abnormality. Musculoskeletal: Degenerative changes in the spine. Postoperative right shoulder arthroplasty. Soft tissue infiltration and soft tissue gas anterior to the right shoulder is likely postoperative. No loculated collections are identified. Review of the MIP images confirms the above findings. IMPRESSION: 1. No evidence of significant pulmonary embolus. 2. Mild atelectasis or scarring in the lung bases. 3. Postoperative changes in the right shoulder. Electronically Signed: By: Boyce Byes M.D. On: 08/17/2023 20:50   DG Chest 2 View Result Date: 08/17/2023 CLINICAL DATA:  Shortness of breath EXAM: CHEST - 2 VIEW COMPARISON:  Chest x-ray 04/22/2021 FINDINGS: The heart size and mediastinal contours are within normal limits. Both lungs are clear. Right shoulder arthroplasty is present. No acute fractures are seen. IMPRESSION: No active cardiopulmonary disease. Electronically Signed   By: Tyron Gallon M.D.   On: 08/17/2023 19:02     Vitals Temp:  [98.3 F (36.8 C)-99.7 F (37.6 C)] 98.3 F (36.8 C) (06/03 0751) Pulse Rate:  [86-108] 102 (06/03 0751) Resp:  [16-25] 16 (06/03 0751) BP: (127-168)/(70-105) 147/79 (06/03 0751) SpO2:  [94 %-100 %] 97 % (06/03 0751) Weight:  [77.1 kg-80.3 kg] 80.3 kg (06/03 0348) Body mass index is 29.46 kg/m.  Physical Exam: Patient is resting comfortably in bed this  morning.  Alert and oriented.  Denies any shortness of breath.  States that his superior shoulder pain has completely resolved at this time.  He remains neurovascular intact in the right upper extremity.  The previously noted resolving ecchymosis is essentially unchanged.  I do not appreciate any evidence to suggest cellulitis or any acute infectious process based on clinical exam.  Workup demonstrates patient to be afebrile.  Normal white count.  CT angiogram negative for PE or pneumonia or other acute pulmonary abnormalities.  The fluid collections and gas noted on the CT scan are to be expected at this early stage postop.     Assessment/Plan: Impression: Status post right shoulder reverse arthroplasty with acute anterosuperior chest wall pain likely secondary to muscular spasm of the strap muscles or perhaps the clavicular head of the pectoralis.  Symptoms have completely resolved today.  Workup has been negative for any obvious acute  pulmonary event. Treatment: I have spoken with the patient regarding his work up.  I have reassured him that his pain likely was related to acute muscular spasm.  Of also had the opportunity speak with the hospitalist Dr. Mikle Alexanders regarding these findings.  This time recommend discharge home.  Continue to follow her standard postop protocol.  He will keep his appointment with us  next week and the contact with our office if there is any change in his status.  Thank you for the excellent care provided to this nice gentleman.  Jymir Dunaj M Dianey Suchy 08/18/2023, 9:16 AM  Contact # [phone]

## 2023-08-18 NOTE — Discharge Summary (Signed)
 Physician Discharge Summary  Andrew Moran:[id number] DOB: 1945/05/04 DOA: 08/17/2023  PCP: Ronna Coho, MD  Admit date: 08/17/2023 Discharge date: 08/18/2023  Admitted From: Home Discharge disposition: Home   Recommendations for Outpatient Follow-Up:   Follow-up with orthopedics as prior scheduled   Discharge Diagnosis:   Principal Problem: Muscle spasm    Discharge Condition: Improved.  Diet recommendation:   Regular.  Wound care: Resume prior  Code status: Full.   History of Present Illness:   78 year old male who is postop day #6, status post right reverse shoulder arthroplasty w/ Dr. Alfredo Ano of [facility], who presented to [facility] ED complaining of right-sided chest and shoulder discomfort.  He reports pain is stabbing in nature and feels like a kink in his neck..   Vital signs in the ED were notable for the following: Temperature max 99.7; heart rates in the 90s to low 100s; systolic blood pressures in the 120s to 150s; respiratory rate 17-25 and oxygen saturation 95 to 100% on room air.   EKG reported to show no evidence of acute ischemic changes.   Labs were notable for CBC reflecting white blood cell count of 9300, hemoglobin 12.2.   Imaging notable for CTA chest which shows evidence of subcutaneous air over the right chest, given the absence of any evidence of pneumothorax, pneumomediastinum, nor any evidence of PE.    Patient was seen by Dr. Alfredo Ano in the a.m. of 6/3.  Per orthopedics the shoulder is not infected and patient most likely had a muscle spasm.   Hospital Course by Problem:   Shoulder pain -most likely muscle spasm -seen by ortho-- no need for abx or other interventions and patient can go home    SOB due to Asthma -PRN albuterol  -home O2 study-no need for oxygen -no SOB since inhaler -IS for atelectasis    H/o DVT -completed therapy -CTA negative for PE      Medical Consultants:   Orthopedics   Discharge Exam:    Vitals:   08/18/23 0800 08/18/23 1301  BP:  131/87  Pulse:  97  Resp: (!) 23 16  Temp:  98.2 F (36.8 C)  SpO2:  98%   Vitals:   08/18/23 0348 08/18/23 0751 08/18/23 0800 08/18/23 1301  BP:  (!) 147/79  131/87  Pulse:  (!) 102  97  Resp:  16 (!) 23 16  Temp:  98.3 F (36.8 C)  98.2 F (36.8 C)  TempSrc:    Oral  SpO2:  97%  98%  Weight: 80.3 kg     Height: 5\' 5"  (1.651 m)       General exam: Appears calm and comfortable.  The results of significant diagnostics from this hospitalization (including imaging, microbiology, ancillary and laboratory) are listed below for reference.     Procedures and Diagnostic Studies:   CT Angio Chest PE W and/or Wo Contrast Addendum Date: 08/17/2023 ADDENDUM REPORT: 08/17/2023 22:03 ADDENDUM: Given additional clinical history of redness and swelling and tenderness in the right shoulder in chest wall, described soft tissue changes of infiltration and gas could indicate infection. No loculated collections are identified. Note that the left shoulder is incompletely included within the field of view of the study and streak artifact from the surgical hardware may limit evaluation. Electronically Signed   By: Boyce Byes M.D.   On: 08/17/2023 22:03   Result Date: 08/17/2023 CLINICAL DATA:  Pulmonary embolus suspected with high probability. Shoulder surgery last week. Increasing shortness of breath  and shoulder pain. EXAM: CT ANGIOGRAPHY CHEST WITH CONTRAST TECHNIQUE: Multidetector CT imaging of the chest was performed using the standard protocol during bolus administration of intravenous contrast. Multiplanar CT image reconstructions and MIPs were obtained to evaluate the vascular anatomy. RADIATION DOSE REDUCTION: This exam was performed according to the departmental dose-optimization program which includes automated exposure control, adjustment of the mA and/or kV according to patient size and/or use of iterative reconstruction technique. CONTRAST:   OMNIPAQUE  IOHEXOL  350 MG/ML SOLN COMPARISON:  Chest radiograph 08/17/2023 FINDINGS: Cardiovascular: Technically adequate study with moderately good opacification of the central and segmental pulmonary arteries. Moderate motion artifact. As visualized, no focal filling defects are identified. No evidence of significant pulmonary embolus. Normal heart size. No pericardial effusions. Normal caliber thoracic aorta. No aortic dissection. Calcification in the coronary arteries. Mediastinum/Nodes: Esophagus is decompressed. No significant lymphadenopathy. Thyroid  gland is unremarkable. Lungs/Pleura: Motion artifact limits examination. Mild linear atelectasis or scarring in the lung bases. No airspace disease. No pleural effusion or pneumothorax. Upper Abdomen: No acute abnormality. Musculoskeletal: Degenerative changes in the spine. Postoperative right shoulder arthroplasty. Soft tissue infiltration and soft tissue gas anterior to the right shoulder is likely postoperative. No loculated collections are identified. Review of the MIP images confirms the above findings. IMPRESSION: 1. No evidence of significant pulmonary embolus. 2. Mild atelectasis or scarring in the lung bases. 3. Postoperative changes in the right shoulder. Electronically Signed: By: Boyce Byes M.D. On: 08/17/2023 20:50   DG Chest 2 View Result Date: 08/17/2023 CLINICAL DATA:  Shortness of breath EXAM: CHEST - 2 VIEW COMPARISON:  Chest x-ray 04/22/2021 FINDINGS: The heart size and mediastinal contours are within normal limits. Both lungs are clear. Right shoulder arthroplasty is present. No acute fractures are seen. IMPRESSION: No active cardiopulmonary disease. Electronically Signed   By: Tyron Gallon M.D.   On: 08/17/2023 19:02     Labs:   Basic Metabolic Panel: Recent Labs  Lab 08/17/23 1805  NA 140  K 3.9  CL 103  CO2 23  GLUCOSE 126*  BUN 14  CREATININE 0.88  CALCIUM 9.5   GFR Estimated Creatinine Clearance: 66.4  mL/min (by C-G formula based on SCr of 0.88 mg/dL). Liver Function Tests: No results for input(s): "AST", "ALT", "ALKPHOS", "BILITOT", "PROT", "ALBUMIN" in the last 168 hours. No results for input(s): "LIPASE", "AMYLASE" in the last 168 hours. No results for input(s): "AMMONIA" in the last 168 hours. Coagulation profile Recent Labs  Lab 08/17/23 1805  INR 1.0    CBC: Recent Labs  Lab 08/17/23 1805  WBC 9.3  HGB 12.2*  HCT 37.2*  MCV 90.5  PLT 302   Cardiac Enzymes: No results for input(s): "CKTOTAL", "CKMB", "CKMBINDEX", "TROPONINI" in the last 168 hours. BNP: Invalid input(s): "POCBNP" CBG: No results for input(s): "GLUCAP" in the last 168 hours. D-Dimer No results for input(s): "DDIMER" in the last 72 hours. Hgb A1c No results for input(s): "HGBA1C" in the last 72 hours. Lipid Profile No results for input(s): "CHOL", "HDL", "LDLCALC", "TRIG", "CHOLHDL", "LDLDIRECT" in the last 72 hours. Thyroid  function studies No results for input(s): "TSH", "T4TOTAL", "T3FREE", "THYROIDAB" in the last 72 hours.  Invalid input(s): "FREET3" Anemia work up No results for input(s): "VITAMINB12", "FOLATE", "FERRITIN", "TIBC", "IRON", "RETICCTPCT" in the last 72 hours. Microbiology No results found for this or any previous visit (from the past 240 hours).   Discharge Instructions:   Discharge Instructions     Diet general   Complete by: As directed  Discharge instructions   Complete by: As directed    Continue prior instructions from Dr. Drew Gentleman have flexoril at home to use to relax your muscle   Increase activity slowly   Complete by: As directed    No wound care   Complete by: As directed    Place in observation (patient's expected length of stay will be less than 2 midnights)   Complete by: As directed       Allergies as of 08/18/2023       Reactions   Zithromax [azithromycin] Shortness Of Breath   Betadine  [povidone Iodine ] Itching   Iodine  drape, if left on  too long   Wound Dressing Adhesive Other (See Comments)   Irritation         Medication List     TAKE these medications    acetaminophen  500 MG tablet Commonly known as: TYLENOL  Take 500-1,000 mg by mouth every 6 (six) hours as needed for moderate pain (pain score 4-6).   albuterol  108 (90 Base) MCG/ACT inhaler Commonly known as: VENTOLIN  HFA Inhale 2 puffs into the lungs every 6 (six) hours as needed for wheezing or shortness of breath.   atorvastatin 20 MG tablet Commonly known as: LIPITOR Take 20 mg by mouth daily.   budesonide -formoterol  80-4.5 MCG/ACT inhaler Commonly known as: SYMBICORT  Inhale 2 puffs into the lungs 2 (two) times daily.   cyclobenzaprine 10 MG tablet Commonly known as: FLEXERIL Take 10 mg by mouth every 6 (six) hours as needed for muscle spasms.   multivitamins ther. w/minerals Tabs tablet Take 1 tablet by mouth daily.   naproxen 500 MG tablet Commonly known as: NAPROSYN Take 500 mg by mouth 2 (two) times daily as needed for moderate pain (pain score 4-6).   ondansetron  4 MG tablet Commonly known as: ZOFRAN  Take 4 mg by mouth every 8 (eight) hours as needed for nausea or vomiting.   OVER THE COUNTER MEDICATION Take 2 tablets by mouth at bedtime. Deglycyrrhizinated-DGL   oxyCODONE -acetaminophen  5-325 MG tablet Commonly known as: PERCOCET/ROXICET Take 1 tablet by mouth every 6 (six) hours as needed for moderate pain (pain score 4-6).   traMADol 50 MG tablet Commonly known as: ULTRAM Take 25-50 mg by mouth every 6 (six) hours as needed for moderate pain (pain score 4-6).          Time coordinating discharge: 45 min  Signed:  Enrigue Harvard DO  [facility] 08/18/2023, 2:35 PM

## 2023-08-18 NOTE — H&P (Addendum)
 History and Physical    Andrew Moran ZOX:[id number] DOB: 06-11-1945 DOA: 08/17/2023  I have briefly reviewed the patient's prior medical records in [facility]  PCP: Ronna Coho, MD  Patient coming from: home  Chief Complaint: stabbing pain in neck/shoulder  HPI:  78 year old male who is postop day #6, status post right reverse shoulder arthroplasty w/ Dr. Alfredo Ano of [facility], who presented to [facility] ED complaining of right-sided chest and shoulder discomfort.  He reports pain is stabbing in nature and feels like a kink in his neck..   Vital signs in the ED were notable for the following: Temperature max 99.7; heart rates in the 90s to low 100s; systolic blood pressures in the 120s to 150s; respiratory rate 17-25 and oxygen saturation 95 to 100% on room air.   EKG reported to show no evidence of acute ischemic changes.   Labs were notable for CBC reflecting white blood cell count of 9300, hemoglobin 12.2.   Imaging notable for CTA chest which shows evidence of subcutaneous air over the right chest, given the absence of any evidence of pneumothorax, pneumomediastinum, nor any evidence of PE.    Patient was seen by Dr. Alfredo Ano in the a.m. of 6/3.  Per orthopedics the shoulder is not infected and patient most likely had a muscle spasm.      Review of Systems: As per HPI otherwise 10 point review of systems negative.   Past Medical History:  Diagnosis Date   Arthritis    Asthma    pulmology-- dr wert--- cough varient versus uacs   GERD (gastroesophageal reflux disease)    per pt takes DGL supplement   History of diverticulitis of colon 2002   managed medically , no surgical intervention   History of DVT of lower extremity 06/27/2019   followed by dr g. Chancy Comber--  completed 3 months xarelto  07/ 2021   History of kidney stones    Renal calculi    left side    Past Surgical History:  Procedure Laterality Date   ANKLE HARDWARE REMOVAL Right 2008   CARPAL TUNNEL RELEASE  Bilateral left 1983;  right 1984   CYSTOSCOPY WITH RETROGRADE PYELOGRAM, URETEROSCOPY AND STENT PLACEMENT Left 02/23/2020   Procedure: CYSTOSCOPY WITH RETROGRADE PYELOGRAM, URETEROSCOPY, LITHOTRIPSY, STONE EXTRACTION,  AND STENT PLACEMENT;  Surgeon: Trent Frizzle, MD;  Location: [facility];  Service: Urology;  Laterality: Left;   CYSTOSCOPY/RETROGRADE/URETEROSCOPY/STONE EXTRACTION WITH BASKET Bilateral 10/10/2013   Procedure: CYSTOSCOPY/URETEROSCOPY/STONE EXTRACTION WITH HOLMIUM LASER, bilateral retrograde, bilateral ureteral stents;  Surgeon: Roque Collar, MD;  Location: [facility];  Service: Urology;  Laterality: Bilateral;   EXPLORATORY LAPAROTOMY  1970   EXPLORATORY STOMACH SURGERY FOR TEAR IN STOMACH WALL   EXTRACORPOREAL SHOCK WAVE LITHOTRIPSY Left 10/06/2019   Procedure: EXTRACORPOREAL SHOCK WAVE LITHOTRIPSY (ESWL);  Surgeon: Roxane Copp, MD;  Location: [facility] - ;  Service: Urology;  Laterality: Left;   EXTRACORPOREAL SHOCK WAVE LITHOTRIPSY  multiple since 2002,  last one 02-28-2013   HOLMIUM LASER APPLICATION Left 10/10/2013   Procedure: HOLMIUM LASER APPLICATION;  Surgeon: Roque Collar, MD;  Location: [facility];  Service: Urology;  Laterality: Left;   KNEE ARTHROSCOPY  02/05/2011   Procedure: ARTHROSCOPY KNEE;  Surgeon: Robbie Chiles [place];  Location: [facility];  Service: Orthopedics;  Laterality: Right;  Right knee arthroscopy partial lateral and medial meniscectomy with condyle debridement   LUMBAR SPINE SURGERY  x2  1995   ORIF ANKLE FRACTURE Right 1988   ROTATOR CUFF REPAIR  Bilateral right 02/16/2018;  left 1997   TONSILLECTOMY AND ADENOIDECTOMY  1951   TOTAL KNEE ARTHROPLASTY Left 04/08/2019   Procedure: TOTAL KNEE ARTHROPLASTY;  Surgeon: Genevie Kerns, MD;  Location: [facility];  Service: Orthopedics;  Laterality: Left;  with adductor canal     reports that he has never smoked. He has never used smokeless tobacco. He reports current alcohol  use. He reports that he does not use drugs.  Allergies  Allergen Reactions   Zithromax [Azithromycin] Shortness Of Breath   Betadine  [Povidone Iodine ] Itching    Iodine  drape, if left on too long    Family History  Problem Relation Age of Onset   Heart attack Father     Prior to Admission medications   Medication Sig Start Date End Date Taking? Authorizing Provider  albuterol  (VENTOLIN  HFA) 108 (90 Base) MCG/ACT inhaler 2 puffs    [provider]  APPLE CIDER VINEGAR PO Take 5 mLs by mouth daily.    [provider]  atorvastatin (LIPITOR) 20 MG tablet Take 20 mg by mouth at bedtime.    [provider]  cyclobenzaprine (FLEXERIL) 10 MG tablet Take 10 mg by mouth every 6 (six) hours as needed for muscle spasms. 08/11/23   [provider]  Multiple Vitamins-Minerals (MULTIVITAMINS THER. W/MINERALS) TABS Take 1 tablet by mouth daily.    [provider]  naproxen (NAPROSYN) 500 MG tablet Take 500 mg by mouth 2 (two) times daily. 08/11/23   [provider]  ondansetron  (ZOFRAN ) 4 MG tablet Take 4 mg by mouth every 8 (eight) hours as needed. 08/11/23   [provider]  OVER THE COUNTER MEDICATION Take 2 tablets by mouth at bedtime. Deglycyrrhizinated-DGL    [provider]  oxyCODONE -acetaminophen  (PERCOCET/ROXICET) 5-325 MG tablet Take 1 tablet by mouth every 6 (six) hours as needed for moderate pain (pain score 4-6). 08/11/23   [provider]  traMADol (ULTRAM) 50 MG tablet Take 50 mg by mouth every 6 (six) hours as needed. 08/11/23   [provider]    Physical Exam: Vitals:   08/18/23 3086 08/18/23 0345 08/18/23 0348 08/18/23 0751  BP:  127/82  (!) 147/79  Pulse:  96  (!) 102  Resp:  18  16  Temp:  99 F (37.2 C)  98.3 F (36.8 C)  TempSrc:  Oral    SpO2: 98% 97%  97%  Weight:   80.3 kg   Height:   5\' 5"  (1.651 m)       Constitutional: NAD, calm, comfortable Eyes: PERRL, lids and  conjunctivae normal ENMT: Mucous membranes are moist. Posterior pharynx clear of any exudate or lesions.Normal dentition.  Neck: normal, supple, no masses, no thyromegaly Respiratory: clear to auscultation bilaterally, no wheezing, no crackles. Normal respiratory effort. No accessory muscle use.  Cardiovascular: Regular rate and rhythm, no murmurs / rubs / gallops. No extremity edema. 2+ pedal pulses.  Abdomen: no tenderness, no masses palpated. Bowel sounds positive.  Musculoskeletal: right shoulder in sling Skin: no rashes, lesions, ulcers. No induration Neurologic: CN 2-12 grossly intact. Strength 5/5 in all 4.  Psychiatric: Normal judgment and insight. Alert and oriented x 3. Normal mood.   Labs on Admission: I have personally reviewed following labs and imaging studies  CBC: Recent Labs  Lab 08/17/23 1805  WBC 9.3  HGB 12.2*  HCT 37.2*  MCV 90.5  PLT 302   Basic Metabolic Panel: Recent Labs  Lab 08/17/23 1805  NA 140  K 3.9  CL 103  CO2 23  GLUCOSE 126*  BUN 14  CREATININE 0.88  CALCIUM 9.5   GFR: Estimated Creatinine Clearance: 66.4 mL/min (by C-G formula based on SCr of 0.88 mg/dL). Liver Function Tests: No results for input(s): "AST", "ALT", "ALKPHOS", "BILITOT", "PROT", "ALBUMIN" in the last 168 hours. No results for input(s): "LIPASE", "AMYLASE" in the last 168 hours. No results for input(s): "AMMONIA" in the last 168 hours. Coagulation Profile: Recent Labs  Lab 08/17/23 1805  INR 1.0   Cardiac Enzymes: No results for input(s): "CKTOTAL", "CKMB", "CKMBINDEX", "TROPONINI" in the last 168 hours. BNP (last 3 results) No results for input(s): "PROBNP" in the last 8760 hours. HbA1C: No results for input(s): "HGBA1C" in the last 72 hours. CBG: No results for input(s): "GLUCAP" in the last 168 hours. Lipid Profile: No results for input(s): "CHOL", "HDL", "LDLCALC", "TRIG", "CHOLHDL", "LDLDIRECT" in the last 72 hours. Thyroid  Function Tests: No results for  input(s): "TSH", "T4TOTAL", "FREET4", "T3FREE", "THYROIDAB" in the last 72 hours. Anemia Panel: No results for input(s): "VITAMINB12", "FOLATE", "FERRITIN", "TIBC", "IRON", "RETICCTPCT" in the last 72 hours. Urine analysis:    Component Value Date/Time   COLORURINE YELLOW 10/12/2006 1400   APPEARANCEUR CLEAR 10/12/2006 1400   LABSPEC 1.017 10/12/2006 1400   PHURINE 6.0 10/12/2006 1400   GLUCOSEU NEGATIVE 10/12/2006 1400   HGBUR NEGATIVE 10/12/2006 1400   BILIRUBINUR NEGATIVE 10/12/2006 1400   KETONESUR NEGATIVE 10/12/2006 1400   PROTEINUR NEGATIVE 10/12/2006 1400   UROBILINOGEN 0.2 10/12/2006 1400   NITRITE NEGATIVE 10/12/2006 1400   LEUKOCYTESUR  10/12/2006 1400    NEGATIVE MICROSCOPIC NOT DONE ON URINES WITH NEGATIVE PROTEIN, BLOOD, LEUKOCYTES, NITRITE, OR GLUCOSE <1000 mg/dL.     Radiological Exams on Admission: CT Angio Chest PE W and/or Wo Contrast Addendum Date: 08/17/2023 ADDENDUM REPORT: 08/17/2023 22:03 ADDENDUM: Given additional clinical history of redness and swelling and tenderness in the right shoulder in chest wall, described soft tissue changes of infiltration and gas could indicate infection. No loculated collections are identified. Note that the left shoulder is incompletely included within the field of view of the study and streak artifact from the surgical hardware may limit evaluation. Electronically Signed   By: Boyce Byes M.D.   On: 08/17/2023 22:03   Result Date: 08/17/2023 CLINICAL DATA:  Pulmonary embolus suspected with high probability. Shoulder surgery last week. Increasing shortness of breath and shoulder pain. EXAM: CT ANGIOGRAPHY CHEST WITH CONTRAST TECHNIQUE: Multidetector CT imaging of the chest was performed using the standard protocol during bolus administration of intravenous contrast. Multiplanar CT image reconstructions and MIPs were obtained to evaluate the vascular anatomy. RADIATION DOSE REDUCTION: This exam was performed according to the  departmental dose-optimization program which includes automated exposure control, adjustment of the mA and/or kV according to patient size and/or use of iterative reconstruction technique. CONTRAST:  OMNIPAQUE  IOHEXOL  350 MG/ML SOLN COMPARISON:  Chest radiograph 08/17/2023 FINDINGS: Cardiovascular: Technically adequate study with moderately good opacification of the central and segmental pulmonary arteries. Moderate motion artifact. As visualized, no focal filling defects are identified. No evidence of significant pulmonary embolus. Normal heart size. No pericardial effusions. Normal caliber thoracic aorta. No aortic dissection. Calcification in the coronary arteries. Mediastinum/Nodes: Esophagus is decompressed. No significant lymphadenopathy. Thyroid  gland is unremarkable. Lungs/Pleura: Motion artifact limits examination. Mild linear atelectasis or scarring in the lung bases. No airspace disease. No pleural effusion or pneumothorax. Upper Abdomen: No acute abnormality. Musculoskeletal: Degenerative changes in the spine. Postoperative right shoulder arthroplasty. Soft tissue infiltration and soft  tissue gas anterior to the right shoulder is likely postoperative. No loculated collections are identified. Review of the MIP images confirms the above findings. IMPRESSION: 1. No evidence of significant pulmonary embolus. 2. Mild atelectasis or scarring in the lung bases. 3. Postoperative changes in the right shoulder. Electronically Signed: By: Boyce Byes M.D. On: 08/17/2023 20:50   DG Chest 2 View Result Date: 08/17/2023 CLINICAL DATA:  Shortness of breath EXAM: CHEST - 2 VIEW COMPARISON:  Chest x-ray 04/22/2021 FINDINGS: The heart size and mediastinal contours are within normal limits. Both lungs are clear. Right shoulder arthroplasty is present. No acute fractures are seen. IMPRESSION: No active cardiopulmonary disease. Electronically Signed   By: Tyron Gallon M.D.   On: 08/17/2023 19:02    EKG:  Independently reviewed. Sinus tachy  Assessment/Plan Principal Problem:   Cellulitis    Shoulder pain -most likely muscle spasm -seen by ortho-- no need for abx or other interventions and patient can go home later today  SOB due to Asthma -PRN albuterol  -home O2 study -no SOB since inhaler -IS for atelectasis   H/o DVT -completed therapy -CTA negative for PE    Being the patient symptoms have resolved and has been seen by orthopedics, plan is for home later today after PT eval for safe ambulation as well as a home O2 study.    Consults called: ortho (supple)      Enrigue [facility]   How to contact the [facility] Attending or Consulting provider 7A - 7P or covering provider during after hours 7P -7A, for this patient?  Check the care team in [facility] and look for a) attending/consulting [facility] provider listed and b) the [facility] team listed Log into [email or web address] and use [place]'s universal password to access. If you do not have the password, please contact the hospital operator. Locate the [facility] provider you are looking for under [facility] and page to a number that you can be directly reached. If you still have difficulty reaching the provider, please page the [facility] (Director on Call) for the Hospitalists listed on amion for assistance.  08/18/2023, 8:01 AM

## 2023-08-24 ENCOUNTER — Ambulatory Visit: Payer: Self-pay

## 2023-08-24 NOTE — Telephone Encounter (Signed)
 Copied from CRM [id number]. Topic: Clinical - Red Word Triage >> Aug 24, 2023 10:12 AM Isabell A wrote: Red Word that prompted transfer to Nurse Triage: Patient has been experiencing SOB.   FYI Only or Action Required?: FYI only for provider  Patient is followed in Pulmonology for asthma, last seen on 05/21/2021 by Diamond Formica, MD. Called Nurse Triage reporting Shortness of Breath. Symptoms began 2 weeks. Interventions attempted: Maintenance inhaler. Symptoms are: gradually worsening.  Triage Disposition: See HCP Within 4 Hours (Or PCP Triage)  Patient/caregiver understands and will follow disposition?: Yes   Reason for Disposition  [1] MILD difficulty breathing (e.g., minimal/no SOB at rest, SOB with walking, pulse <100) AND [2] NEW-onset or WORSE than normal    Pulm patient, symptoms x2 weeks, appointment 6/13  Answer Assessment - Initial Assessment Questions 1. RESPIRATORY STATUS: "Describe your breathing?" (e.g., wheezing, shortness of breath, unable to speak, severe coughing)      Shortness of breath  2. ONSET: "When did this breathing problem begin?"      2 weeks ago  3. PATTERN "Does the difficult breathing come and go, or has it been constant since it started?"      Intermittent  4. SEVERITY: "How bad is your breathing?" (e.g., mild, moderate, severe)    - MILD: No SOB at rest, mild SOB with walking, speaks normally in sentences, can lie down, no retractions, pulse < 100.    - MODERATE: SOB at rest, SOB with minimal exertion and prefers to sit, cannot lie down flat, speaks in phrases, mild retractions, audible wheezing, pulse 100-120.    - SEVERE: Very SOB at rest, speaks in single words, struggling to breathe, sitting hunched forward, retractions, pulse > 120      Mild to moderate  5. RECURRENT SYMPTOM: "Have you had difficulty breathing before?" If Yes, ask: "When was the last time?" and "What happened that time?"      Yes 6. CARDIAC HISTORY: "Do you have any history of heart  disease?" (e.g., heart attack, angina, bypass surgery, angioplasty)      No 7. LUNG HISTORY: "Do you have any history of lung disease?"  (e.g., pulmonary embolus, asthma, emphysema)     Asthma 8. CAUSE: "What do you think is causing the breathing problem?"      Unsure  9. OTHER SYMPTOMS: "Do you have any other symptoms? (e.g., dizziness, runny nose, cough, chest pain, fever)     Cough 10. O2 SATURATION MONITOR:  "Do you use an oxygen saturation monitor (pulse oximeter) at home?" If Yes, ask: "What is your reading (oxygen level) today?" "What is your usual oxygen saturation reading?" (e.g., 95%)       96%  Protocols used: Breathing Difficulty-A-AH

## 2023-08-27 DIAGNOSIS — L03313 Cellulitis of chest wall: Secondary | ICD-10-CM | POA: Diagnosis not present

## 2023-08-27 NOTE — Progress Notes (Signed)
 08/28/23- 78 yoM for acute visit- short of breath x 2 weeks Medical problem list includes GERD, hx DVT, recent R shoulder surgery,  LOV -05/21/21- Dr Waymond Hailey- Asthma/ UACS, complicated by GERD,  - Albuterol  hfa, Symbicort  80, (Prilosec 20 before breakfast, Pepcid  20 before supper- Dr Jacqui Mau advice 2023) -----SOB since 07/04/2023.  Increased use of albuterol  MDI Discussed the use of AI scribe software for clinical note transcription with the patient, who gave verbal consent to proceed.  History of Present Illness   Andrew Moran is a 78 year old male with asthma who presents with increased shortness of breath and cough following shoulder surgery.  Following a fall May 27 requiring shoulder  replacement surgery, he has experienced increased shortness of breath and a dry cough. His asthma was well-controlled prior to surgery with reduced use of Symbicort  and albuterol . Post-surgery, there is a significant increase in respiratory symptoms, including a persistent cough with a tickling sensation. Shortness of breath is temporarily relieved by albuterol  for four to five hours. He has increased his use of Symbicort  to one puff in the morning and one at night. His feelings are consistent with his past asthma symptoms. CT scan did not indicate pneumonia, PE  or other cause.  He has a long-standing history of asthma since childhood, previously well-managed with Symbicort . He had reduced his usage last year due to improved symptoms.  There is no sputum production and no significant heartburn or acid reflux symptoms recently. He notes a change in his voice, describing it as 'very raspy,' present for the last several weeks and possibly related to coughing and intubation.     Assessment and Plan:    Asthma exacerbation Exacerbation post-surgery and rib injury. CT scan ruled out pneumonia and pulmonary embolism. - Provide Breztri inhaler samples with additional bronchodilator. Use two puffs twice daily, rinse  mouth after use. - Prescribe prednisone  taper. Advise Pepcid  once daily before breakfast to prevent gastric irritation. - Refill Symbicort  for one year.     History of Present Illness  CTaPE chest 08/17/23-  IMPRESSION: 1. No evidence of significant pulmonary embolus. 2. Mild atelectasis or scarring in the lung bases. 3. Postoperative changes in the right shoulder.  Prior to Admission medications   Medication Sig Start Date End Date Taking? Authorizing Provider  acetaminophen  (TYLENOL ) 500 MG tablet Take 500-1,000 mg by mouth every 6 (six) hours as needed for moderate pain (pain score 4-6).   Yes [provider]  albuterol  (VENTOLIN  HFA) 108 (90 Base) MCG/ACT inhaler Inhale 2 puffs into the lungs every 6 (six) hours as needed for wheezing or shortness of breath.   Yes [provider]  atorvastatin  (LIPITOR) 20 MG tablet Take 20 mg by mouth daily.   Yes [provider]  Multiple Vitamins-Minerals (MULTIVITAMINS THER. W/MINERALS) TABS Take 1 tablet by mouth daily.   Yes [provider]  OVER THE COUNTER MEDICATION Take 2 tablets by mouth at bedtime. Deglycyrrhizinated-DGL   Yes [provider]  predniSONE  (DELTASONE ) 10 MG tablet 4 X 2 DAYS, 3 X 2 DAYS, 2 X 2 DAYS, 1 X 2 DAYS 08/28/23  Yes Basya Casavant D, MD  budesonide -formoterol  (SYMBICORT ) 80-4.5 MCG/ACT inhaler Inhale 2 puffs into the lungs 2 (two) times daily. 08/28/23   Faustina Hood, MD   Past Medical History:  Diagnosis Date   Arthritis    Asthma    pulmology-- dr wert--- cough varient versus uacs   GERD (gastroesophageal reflux disease)    per  pt takes DGL supplement   History of diverticulitis of colon 2002   managed medically , no surgical intervention   History of DVT of lower extremity 06/27/2019   followed by dr g. Chancy Comber--  completed 3 months xarelto  07/ 2021   History of kidney stones    Renal calculi    left side   Past Surgical History:  Procedure Laterality Date    ANKLE HARDWARE REMOVAL Right 2008   CARPAL TUNNEL RELEASE Bilateral left 1983;  right 1984   CYSTOSCOPY WITH RETROGRADE PYELOGRAM, URETEROSCOPY AND STENT PLACEMENT Left 02/23/2020   Procedure: CYSTOSCOPY WITH RETROGRADE PYELOGRAM, URETEROSCOPY, LITHOTRIPSY, STONE EXTRACTION,  AND STENT PLACEMENT;  Surgeon: Trent Frizzle, MD;  Location: [facility];  Service: Urology;  Laterality: Left;   CYSTOSCOPY/RETROGRADE/URETEROSCOPY/STONE EXTRACTION WITH BASKET Bilateral 10/10/2013   Procedure: CYSTOSCOPY/URETEROSCOPY/STONE EXTRACTION WITH HOLMIUM LASER, bilateral retrograde, bilateral ureteral stents;  Surgeon: Roque Collar, MD;  Location: [facility];  Service: Urology;  Laterality: Bilateral;   EXPLORATORY LAPAROTOMY  1970   EXPLORATORY STOMACH SURGERY FOR TEAR IN STOMACH WALL   EXTRACORPOREAL SHOCK WAVE LITHOTRIPSY Left 10/06/2019   Procedure: EXTRACORPOREAL SHOCK WAVE LITHOTRIPSY (ESWL);  Surgeon: Roxane Copp, MD;  Location: [facility];  Service: Urology;  Laterality: Left;   EXTRACORPOREAL SHOCK WAVE LITHOTRIPSY  multiple since 2002,  last one 02-28-2013   HOLMIUM LASER APPLICATION Left 10/10/2013   Procedure: HOLMIUM LASER APPLICATION;  Surgeon: Roque Collar, MD;  Location: [facility];  Service: Urology;  Laterality: Left;   KNEE ARTHROSCOPY  02/05/2011   Procedure: ARTHROSCOPY KNEE;  Surgeon: Robbie Chiles Aplington;  Location: [facility];  Service: Orthopedics;  Laterality: Right;  Right knee arthroscopy partial lateral and medial meniscectomy with condyle debridement   LUMBAR SPINE SURGERY  x2  1995   ORIF ANKLE FRACTURE Right 1988   ROTATOR CUFF REPAIR Bilateral right 02/16/2018;  left 1997   TONSILLECTOMY AND ADENOIDECTOMY  1951   TOTAL KNEE ARTHROPLASTY Left 04/08/2019   Procedure: TOTAL KNEE ARTHROPLASTY;  Surgeon: Genevie Kerns, MD;  Location: [facility];  Service: Orthopedics;  Laterality: Left;  with adductor canal   Family History  Problem Relation Age of  Onset   Heart attack Father   ' Social History   Socioeconomic History   Marital status: Married    Spouse name: Not on file   Number of children: Not on file   Years of education: Not on file   Highest education level: Not on file  Occupational History   Not on file  Tobacco Use   Smoking status: Never   Smokeless tobacco: Never  Vaping Use   Vaping status: Never Used  Substance and Sexual Activity   Alcohol use: Yes    Comment: OCCASIONAL   Drug use: No   Sexual activity: Yes  Other Topics Concern   Not on file  Social History Narrative   Not on file   Social Drivers of Health   Financial Resource Strain: Not on file  Food Insecurity: No Food Insecurity (08/18/2023)   Hunger Vital Sign    Worried About Running Out of Food in the Last Year: Never true    Ran Out of Food in the Last Year: Never true  Transportation Needs: No Transportation Needs (08/18/2023)   PRAPARE - Administrator, Civil Service (Medical): No    Lack of Transportation (Non-Medical): No  Physical Activity: Not on file  Stress: Not on file  Social Connections: Socially Integrated (08/18/2023)  Social Advertising account executive    Frequency of Communication with Friends and Family: Twice a week    Frequency of Social Gatherings with Friends and Family: Once a week    Attends Religious Services: 1 to 4 times per year    Active Member of [employer] or Organizations: Yes    Attends Engineer, structural: More than 4 times per year    Marital Status: Married  Catering manager Violence: Not At Risk (08/18/2023)   Humiliation, Afraid, Rape, and Kick questionnaire    Fear of Current or Ex-Partner: No    Emotionally Abused: No    Physically Abused: No    Sexually Abused: No   ROS-see HPI   + = positive Constitutional:    weight loss, night sweats, fevers, chills, fatigue, lassitude. HEENT:    headaches, difficulty swallowing, tooth/dental problems, sore throat,       sneezing, itching,  ear ache, nasal congestion, post nasal drip, snoring CV:    chest pain, orthopnea, PND, swelling in lower extremities, anasarca,                                   dizziness, palpitations Resp:   shortness of breath with exertion or at rest.                productive cough,   non-productive cough, coughing up of blood.              change in color of mucus.  wheezing.   Skin:    rash or lesions. GI:  No-   heartburn, indigestion, abdominal pain, nausea, vomiting, diarrhea,                 change in bowel habits, loss of appetite GU: dysuria, change in color of urine, no urgency or frequency.   flank pain. MS:   joint pain, stiffness, decreased range of motion, back pain. Neuro-     nothing unusual Psych:  change in mood or affect.  depression or anxiety.   memory loss.  OBJ- Physical Exam General- Alert, Oriented, Affect-appropriate, Distress- none acute Skin- rash-none, lesions- none, excoriation- none Lymphadenopathy- none Head- atraumatic            Eyes- Gross vision intact, PERRLA, conjunctivae and secretions clear            Ears- Hearing, canals-normal            Nose- Clear, no-Septal dev, mucus, polyps, erosion, perforation             Throat- Mallampati II , mucosa clear , drainage- none, tonsils- atrophic, +mild hoarseness Neck- flexible , trachea midline, no stridor , thyroid  nl, carotid no bruit Chest - symmetrical excursion , unlabored           Heart/CV- RRR , no murmur , no gallop  , no rub, nl s1 s2                           - JVD- none , edema- none, stasis changes- none, varices- none           Lung- clear to P&A, wheeze- none, cough- none , dullness-none, rub- none           Chest wall-  Abd-  Br/ Gen/ Rectal- Not done, not indicated Extrem-+ R arm in sling Neuro- grossly intact to observation

## 2023-08-28 ENCOUNTER — Encounter: Payer: Self-pay | Admitting: Internal Medicine

## 2023-08-28 ENCOUNTER — Ambulatory Visit: Admitting: Internal Medicine

## 2023-08-28 VITALS — BP 114/70 | HR 83 | Temp 98.0°F | Ht 65.0 in | Wt 171.4 lb

## 2023-08-28 DIAGNOSIS — J4521 Mild intermittent asthma with (acute) exacerbation: Secondary | ICD-10-CM | POA: Diagnosis not present

## 2023-08-28 MED ORDER — BREZTRI AEROSPHERE 160-9-4.8 MCG/ACT IN AERO: 2.0000 | INHALATION_SPRAY | Freq: Two times a day (BID) | RESPIRATORY_TRACT | Status: DC

## 2023-08-28 MED ORDER — PREDNISONE 10 MG PO TABS
ORAL_TABLET | ORAL | 0 refills | Status: DC
Start: 2023-08-28 — End: 2023-10-23

## 2023-08-28 MED ORDER — BUDESONIDE-FORMOTEROL FUMARATE 80-4.5 MCG/ACT IN AERO: 2.0000 | INHALATION_SPRAY | Freq: Two times a day (BID) | RESPIRATORY_TRACT | 12 refills | Status: DC

## 2023-08-28 NOTE — Patient Instructions (Signed)
 Order- sample x 2 Breztri inhaler- inhale 2 puffs then rinse mouth, twice daily Try this instead of Symbicort  for now. You can go back to Symbicort  after.  Script sent for prednisone  taper  Suggest that while you are on these meds, you go back to using an acid blocker-     otc Pepcid  20 mg once daily before breakfast

## 2023-09-08 ENCOUNTER — Ambulatory Visit: Admitting: Physical Therapy

## 2023-09-11 DIAGNOSIS — D692 Other nonthrombocytopenic purpura: Secondary | ICD-10-CM | POA: Diagnosis not present

## 2023-09-11 DIAGNOSIS — L821 Other seborrheic keratosis: Secondary | ICD-10-CM | POA: Diagnosis not present

## 2023-09-11 DIAGNOSIS — L570 Actinic keratosis: Secondary | ICD-10-CM | POA: Diagnosis not present

## 2023-09-11 DIAGNOSIS — D1801 Hemangioma of skin and subcutaneous tissue: Secondary | ICD-10-CM | POA: Diagnosis not present

## 2023-09-11 DIAGNOSIS — L814 Other melanin hyperpigmentation: Secondary | ICD-10-CM | POA: Diagnosis not present

## 2023-09-14 ENCOUNTER — Ambulatory Visit: Attending: Orthopedic Surgery

## 2023-09-14 ENCOUNTER — Other Ambulatory Visit: Payer: Self-pay

## 2023-09-14 DIAGNOSIS — M6281 Muscle weakness (generalized): Secondary | ICD-10-CM | POA: Diagnosis not present

## 2023-09-14 DIAGNOSIS — M25611 Stiffness of right shoulder, not elsewhere classified: Secondary | ICD-10-CM | POA: Insufficient documentation

## 2023-09-14 DIAGNOSIS — R293 Abnormal posture: Secondary | ICD-10-CM | POA: Diagnosis not present

## 2023-09-14 DIAGNOSIS — M25511 Pain in right shoulder: Secondary | ICD-10-CM | POA: Insufficient documentation

## 2023-09-14 NOTE — Therapy (Signed)
 OUTPATIENT PHYSICAL THERAPY UPPER EXTREMITY EVALUATION   Patient Name: Andrew Moran MRN: [id number] DOB:[birth year], 78 y.o., male Today's Date: 09/14/2023  END OF SESSION:  PT End of Session - 09/14/23 1613     Visit Number 1    Date for PT Re-Evaluation 11/09/23    Authorization Type [employer] Medicare- auth requested    Progress Note Due on Visit 10    PT Start Time 1532    PT Stop Time 1614    PT Time Calculation (min) 42 min    Activity Tolerance Patient tolerated treatment well    Behavior During Therapy WFL for tasks assessed/performed          Past Medical History:  Diagnosis Date   Arthritis    Asthma    pulmology-- dr wert--- cough varient versus uacs   GERD (gastroesophageal reflux disease)    per pt takes DGL supplement   History of diverticulitis of colon 2002   managed medically , no surgical intervention   History of DVT of lower extremity 06/27/2019   followed by dr g. loni--  completed 3 months xarelto  07/ 2021   History of kidney stones    Renal calculi    left side   Past Surgical History:  Procedure Laterality Date   ANKLE HARDWARE REMOVAL Right 2008   CARPAL TUNNEL RELEASE Bilateral left 1983;  right 1984   CYSTOSCOPY WITH RETROGRADE PYELOGRAM, URETEROSCOPY AND STENT PLACEMENT Left 02/23/2020   Procedure: CYSTOSCOPY WITH RETROGRADE PYELOGRAM, URETEROSCOPY, LITHOTRIPSY, STONE EXTRACTION,  AND STENT PLACEMENT;  Surgeon: Matilda Senior, MD;  Location: [facility];  Service: Urology;  Laterality: Left;   CYSTOSCOPY/RETROGRADE/URETEROSCOPY/STONE EXTRACTION WITH BASKET Bilateral 10/10/2013   Procedure: CYSTOSCOPY/URETEROSCOPY/STONE EXTRACTION WITH HOLMIUM LASER, bilateral retrograde, bilateral ureteral stents;  Surgeon: Senior CHRISTELLA Matilda, MD;  Location: [facility];  Service: Urology;  Laterality: Bilateral;   EXPLORATORY LAPAROTOMY  1970   EXPLORATORY STOMACH SURGERY FOR TEAR IN STOMACH WALL   EXTRACORPOREAL SHOCK WAVE LITHOTRIPSY Left  10/06/2019   Procedure: EXTRACORPOREAL SHOCK WAVE LITHOTRIPSY (ESWL);  Surgeon: Elisabeth Valli BIRCH, MD;  Location[facility];  Service: Urology;  Laterality: Left;   EXTRACORPOREAL SHOCK WAVE LITHOTRIPSY  multiple since 2002,  last one 02-28-2013   HOLMIUM LASER APPLICATION Left 10/10/2013   Procedure: HOLMIUM LASER APPLICATION;  Surgeon: Senior CHRISTELLA Matilda, MD;  Location: [facility];  Service: Urology;  Laterality: Left;   KNEE ARTHROSCOPY  02/05/2011   Procedure: ARTHROSCOPY KNEE;  Surgeon: Lynwood SQUIBB Aplington;  Location: [facility];  Service: Orthopedics;  Laterality: Right;  Right knee arthroscopy partial lateral and medial meniscectomy with condyle debridement   LUMBAR SPINE SURGERY  x2  1995   ORIF ANKLE FRACTURE Right 1988   ROTATOR CUFF REPAIR Bilateral right 02/16/2018;  left 1997   TONSILLECTOMY AND ADENOIDECTOMY  1951   TOTAL KNEE ARTHROPLASTY Left 04/08/2019   Procedure: TOTAL KNEE ARTHROPLASTY;  Surgeon: Gerome Charleston, MD;  Location: [facility];  Service: Orthopedics;  Laterality: Left;  with adductor canal   Patient Active Problem List   Diagnosis Date Noted   Cellulitis 08/17/2023   Asthma exacerbation 04/22/2021   History of total knee arthroplasty 04/08/2019   DOE (dyspnea on exertion) 11/09/2018   Cough variant asthma vs UACS  06/27/2016    PCP: Kip Righter, MD  REFERRING PROVIDER: Melita Drivers, MD  REFERRING DIAG:  Diagnosis  M25.511 (ICD-10-CM) - Pain in right shoulder  S/p Rt reverse total shoulder replacement   THERAPY DIAG:  Acute pain  of right shoulder - Plan: PT plan of care cert/re-cert  Stiffness of right shoulder, not elsewhere classified - Plan: PT plan of care cert/re-cert  Muscle weakness (generalized) - Plan: PT plan of care cert/re-cert  Abnormal posture - Plan: PT plan of care cert/re-cert  Rationale for Evaluation and Treatment: Rehabilitation  ONSET DATE: surgery 08/11/23  SUBJECTIVE:                                                                                                                                                                                       SUBJECTIVE STATEMENT: Pt presents to PT s/p Rt reverse total shoulder replacement performed 08/11/23.  [place] and had resultant torn rotator cuff and this lead to surgery. He is wearing sling just when out of the house.    Hand dominance: Right  PERTINENT HISTORY: Lumbar surgery x 2 in 1995, ORIF Rt ankle, TKA Lt 2021, fall in airport 06/2023 with concussion, rib fracture and Rt shoulder injury, Rt reverse TSA 08/11/23  PAIN: 09/14/23 Are you having pain? Yes: NPRS scale: 0-6/10 Pain location: Rt shoulder  Pain description: stabbing, aching Aggravating factors: moving Rt UE Relieving factors: pain meds, rest, ice  PRECAUTIONS: Other: follow post-op protocol  RED FLAGS: None   WEIGHT BEARING RESTRICTIONS: No  FALLS:  Has patient fallen in last 6 months? Yes. Number of falls prior to surgery- torn rotator cuff and labrum  LIVING ENVIRONMENT: Lives with: lives with their spouse Lives in: House/apartment Stairs: No Has following equipment at home: Single point cane    PLOF: Independent and Leisure: travel  PATIENT GOALS: return to prior level of function, use Rt UE   NEXT MD VISIT: 10/07/23  OBJECTIVE:  Note: Objective measures were completed at Evaluation unless otherwise noted.  DIAGNOSTIC FINDINGS:  NA  PATIENT SURVEYS :    Quick Dash Disability/Symptom Score:= 79.5% disability   Minimally Clinically Important Difference (MCID): 15-20 points    COGNITION: Overall cognitive status: Within functional limits for tasks assessed     SENSATION: WFL  POSTURE: Forward head/rounded shoulders   UPPER EXTREMITY ROM:   Passive ROM Right eval Left eval  Shoulder flexion 60 WFLs   Shoulder extension    Shoulder abduction 50   Shoulder adduction    Shoulder internal rotation 30   Shoulder external  rotation 15   Elbow flexion    Elbow extension    Wrist flexion    Wrist extension    Wrist ulnar deviation    Wrist radial deviation    Wrist pronation    Wrist supination    (Blank rows = not tested)  UPPER EXTREMITY MMT: Not tested due to post-op restrictions  JOINT MOBILITY TESTING:  NA- post op  PALPATION:  Healing surgical incision over Rt anterior aspect of shoulder.  Reduced mobility and tenderness                                                                                                                              TREATMENT DATE:  09/14/23 Findings from evaluation discussed, pt educated on plan of care, HEP initiated.     PATIENT EDUCATION: Education details: [email or web address], over the door pulleys, scar mobs on Rt   Person educated: Patient Education method: Explanation, Demonstration, and Handouts Education comprehension: verbalized understanding and returned demonstration  HOME EXERCISE PROGRAM: Access Code: [email or web address] URL: [email or web address] Date: 09/14/2023 Prepared by: Burnard  Exercises - Seated Shoulder Shrug Circles AROM Backward  - 5 x daily - 7 x weekly - 1 sets - 10 reps - Seated Shoulder Flexion Towel Slide at Table Top  - 2-3 x daily - 7 x weekly - 1 sets - 10 reps - Seated Elbow Flexion and Extension AROM  - 3 x daily - 7 x weekly - 1 sets - 10 reps - Supine Shoulder Flexion AAROM with Hands Clasped  - 1 x daily - 7 x weekly - 3 sets - 10 reps - Supine Shoulder External Rotation with Dowel  - 2 x daily - 7 x weekly - 1 sets - 10 reps - 10 hold  ASSESSMENT:  CLINICAL IMPRESSION: Patient is a 78 y.o. male who was seen today for physical therapy evaluation and treatment for s/p Rt reverse total shoulder replacement performed on 08/11/23. [place] leading to shoulder injury, concussion and laceration to forehead.  Pt with limited ROM and functional strength consistent with post-op. He is now cleared to wean from  his sling.  PT added to HEP for A/AROM and educated pt and his wife on scar mobs.  Issued info for over the door pulleys and will work with these next session.  Patient will benefit from skilled PT to address the below impairments and improve overall function.    OBJECTIVE IMPAIRMENTS: decreased ROM, decreased strength, increased edema, increased fascial restrictions, impaired perceived functional ability, increased muscle spasms, impaired flexibility, impaired UE functional use, postural dysfunction, and pain.   ACTIVITY LIMITATIONS: carrying, lifting, dressing, self feeding, reach over head, and hygiene/grooming  PARTICIPATION LIMITATIONS: meal prep, cleaning, laundry, driving, community activity, and yard work  PERSONAL FACTORS: Age and 1 comorbidity: s/p Rt TSA are also affecting patient's functional outcome.   REHAB POTENTIAL: Good  CLINICAL DECISION MAKING: Stable/uncomplicated  EVALUATION COMPLEXITY: Low  GOALS: Goals reviewed with patient? Yes  SHORT TERM GOALS: Target date: 10/12/2023    Be independent in initial HEP Baseline: Goal status: INITIAL  2.  Demonstrate Rt shoulder P/ROM flexion to > or = to 100 degrees to promote overhead reaching  Baseline:  Goal status: INITIAL  3.  Improve QuickDASH to < or = to 68% disability  Baseline: 79%  Goal status: INITIAL  4.  Wean from sling and report > or = to 30% use of Rt UE with ADLs and dressing  Baseline:  Goal status: INITIAL  5.  Demonstrate Rt shoulder P/ROM ER to > or = to 45 degrees to improve reaching behind head  Baseline: 15 Goal status: INITIAL    LONG TERM GOALS: Target date: 11/09/2023    Be independent in advanced HEP Baseline:  Goal status: INITIAL  2.  Improve QuickDASH to < or = to 50% disability for use with functional tasks  Baseline: 79%  Goal status: INITIAL  3.  Demonstrate Rt shoulder A/ROM flexion to > or = to 110 degrees for reaching overhead Baseline:  Goal status: INITIAL  4.   Report > or = to 60% use of Rt UE with ADLs and self-care due to improve ROM and functional strength  Baseline:  Goal status: INITIAL  5.  Demonstrate Rt UE A/ROM ER to occiput to aid with self-care Baseline:  Goal status: INITIAL    PLAN: PT FREQUENCY: 2x/week  PT DURATION: 8 weeks  PLANNED INTERVENTIONS: 97110-Therapeutic exercises, 97530- Therapeutic activity, 97112- Neuromuscular re-education, 97535- Self Care, [id number]- Manual therapy, [phone]- Canalith repositioning, [id number]- Aquatic Therapy, [id number]- Electrical stimulation (unattended), 20560 (1-2 muscles), 20561 (3+ muscles)- Dry Needling, Patient/Family education, Taping, Joint mobilization, Scar mobilization, Cryotherapy, and Moist heat  PLAN FOR NEXT SESSION: see if pt got pulleys and teach how to use, review HEP, scar mobs, P/ROM  Burnard Joy, PT 09/14/23 4:31 PM

## 2023-09-16 ENCOUNTER — Ambulatory Visit: Attending: Orthopedic Surgery

## 2023-09-16 DIAGNOSIS — M25511 Pain in right shoulder: Secondary | ICD-10-CM | POA: Diagnosis not present

## 2023-09-16 DIAGNOSIS — M25611 Stiffness of right shoulder, not elsewhere classified: Secondary | ICD-10-CM | POA: Diagnosis not present

## 2023-09-16 DIAGNOSIS — M6281 Muscle weakness (generalized): Secondary | ICD-10-CM | POA: Diagnosis not present

## 2023-09-16 DIAGNOSIS — R293 Abnormal posture: Secondary | ICD-10-CM | POA: Insufficient documentation

## 2023-09-16 NOTE — Therapy (Signed)
 OUTPATIENT PHYSICAL THERAPY TREATMENT   Patient Name: Andrew Moran MRN: [id number] DOB:Oct 12, 1945, 78 y.o., male Today's Date: 09/16/2023  END OF SESSION:  PT End of Session - 09/16/23 1618     Visit Number 2    Date for PT Re-Evaluation 11/09/23    Authorization Type [employer] Medicare- auth requested    Progress Note Due on Visit 10    PT Start Time 1531    PT Stop Time 1614    PT Time Calculation (min) 43 min    Activity Tolerance Patient tolerated treatment well    Behavior During Therapy WFL for tasks assessed/performed           Past Medical History:  Diagnosis Date   Arthritis    Asthma    pulmology-- dr wert--- cough varient versus uacs   GERD (gastroesophageal reflux disease)    per pt takes DGL supplement   History of diverticulitis of colon 2002   managed medically , no surgical intervention   History of DVT of lower extremity 06/27/2019   followed by dr g. loni--  completed 3 months xarelto  07/ 2021   History of kidney stones    Renal calculi    left side   Past Surgical History:  Procedure Laterality Date   ANKLE HARDWARE REMOVAL Right 2008   CARPAL TUNNEL RELEASE Bilateral left 1983;  right 1984   CYSTOSCOPY WITH RETROGRADE PYELOGRAM, URETEROSCOPY AND STENT PLACEMENT Left 02/23/2020   Procedure: CYSTOSCOPY WITH RETROGRADE PYELOGRAM, URETEROSCOPY, LITHOTRIPSY, STONE EXTRACTION,  AND STENT PLACEMENT;  Surgeon: Matilda Senior, MD;  Location: [facility];  Service: Urology;  Laterality: Left;   CYSTOSCOPY/RETROGRADE/URETEROSCOPY/STONE EXTRACTION WITH BASKET Bilateral 10/10/2013   Procedure: CYSTOSCOPY/URETEROSCOPY/STONE EXTRACTION WITH HOLMIUM LASER, bilateral retrograde, bilateral ureteral stents;  Surgeon: Senior CHRISTELLA Matilda, MD;  Location: [facility];  Service: Urology;  Laterality: Bilateral;   EXPLORATORY LAPAROTOMY  1970   EXPLORATORY STOMACH SURGERY FOR TEAR IN STOMACH WALL   EXTRACORPOREAL SHOCK WAVE LITHOTRIPSY Left 10/06/2019    Procedure: EXTRACORPOREAL SHOCK WAVE LITHOTRIPSY (ESWL);  Surgeon: Elisabeth Valli BIRCH, MD;  Location: [facility];  Service: Urology;  Laterality: Left;   EXTRACORPOREAL SHOCK WAVE LITHOTRIPSY  multiple since 2002,  last one 02-28-2013   HOLMIUM LASER APPLICATION Left 10/10/2013   Procedure: HOLMIUM LASER APPLICATION;  Surgeon: Senior CHRISTELLA Matilda, MD;  Location: [facility];  Service: Urology;  Laterality: Left;   KNEE ARTHROSCOPY  02/05/2011   Procedure: ARTHROSCOPY KNEE;  Surgeon: Lynwood SQUIBB Aplington;  Location: [facility];  Service: Orthopedics;  Laterality: Right;  Right knee arthroscopy partial lateral and medial meniscectomy with condyle debridement   LUMBAR SPINE SURGERY  x2  1995   ORIF ANKLE FRACTURE Right 1988   ROTATOR CUFF REPAIR Bilateral right 02/16/2018;  left 1997   TONSILLECTOMY AND ADENOIDECTOMY  1951   TOTAL KNEE ARTHROPLASTY Left 04/08/2019   Procedure: TOTAL KNEE ARTHROPLASTY;  Surgeon: Gerome Charleston, MD;  Location: [facility];  Service: Orthopedics;  Laterality: Left;  with adductor canal   Patient Active Problem List   Diagnosis Date Noted   Cellulitis 08/17/2023   Asthma exacerbation 04/22/2021   History of total knee arthroplasty 04/08/2019   DOE (dyspnea on exertion) 11/09/2018   Cough variant asthma vs UACS  06/27/2016    PCP: Kip Righter, MD  REFERRING PROVIDER: Melita Drivers, MD  REFERRING DIAG:  Diagnosis  M25.511 (ICD-10-CM) - Pain in right shoulder  S/p Rt reverse total shoulder replacement   THERAPY DIAG:  Acute pain of  right shoulder  Stiffness of right shoulder, not elsewhere classified  Muscle weakness (generalized)  Abnormal posture  Rationale for Evaluation and Treatment: Rehabilitation  ONSET DATE: surgery 08/11/23  SUBJECTIVE:                                                                                                                                                                                      SUBJECTIVE  STATEMENT: I've been stretching.  I got some pulleys and tried them at home.    From eval: Pt presents to PT s/p Rt reverse total shoulder replacement performed 08/11/23.  [place] and had resultant torn rotator cuff and this lead to surgery. He is wearing sling just when out of the house.    Hand dominance: Right  PERTINENT HISTORY: Lumbar surgery x 2 in 1995, ORIF Rt ankle, TKA Lt 2021, fall in airport 06/2023 with concussion, rib fracture and Rt shoulder injury, Rt reverse TSA 08/11/23  PAIN: 09/14/23 Are you having pain? Yes: NPRS scale: 0-6/10 Pain location: Rt shoulder  Pain description: stabbing, aching Aggravating factors: moving Rt UE Relieving factors: pain meds, rest, ice  PRECAUTIONS: Other: follow post-op protocol  RED FLAGS: None   WEIGHT BEARING RESTRICTIONS: No  FALLS:  Has patient fallen in last 6 months? Yes. Number of falls prior to surgery- torn rotator cuff and labrum  LIVING ENVIRONMENT: Lives with: lives with their spouse Lives in: House/apartment Stairs: No Has following equipment at home: Single point cane    PLOF: Independent and Leisure: travel  PATIENT GOALS: return to prior level of function, use Rt UE   NEXT MD VISIT: 10/07/23  OBJECTIVE:  Note: Objective measures were completed at Evaluation unless otherwise noted.  DIAGNOSTIC FINDINGS:  NA  PATIENT SURVEYS :    Quick Dash Disability/Symptom Score:= 79.5% disability   Minimally Clinically Important Difference (MCID): 15-20 points    COGNITION: Overall cognitive status: Within functional limits for tasks assessed     SENSATION: WFL  POSTURE: Forward head/rounded shoulders   UPPER EXTREMITY ROM:   Passive ROM Right eval Left eval  Shoulder flexion 60 WFLs   Shoulder extension    Shoulder abduction 50   Shoulder adduction    Shoulder internal rotation 30   Shoulder external rotation 15   Elbow flexion    Elbow extension    Wrist flexion     Wrist extension    Wrist ulnar deviation    Wrist radial deviation    Wrist pronation    Wrist supination    (Blank rows = not tested)  UPPER EXTREMITY MMT: Not tested due to post-op restrictions  JOINT  MOBILITY TESTING:  NA- post op  PALPATION:  Healing surgical incision over Rt anterior aspect of shoulder.  Reduced mobility and tenderness                                                                                                                              TREATMENT DATE:   09/16/23 UE Ranger: seated: flexion and circles 2x5 each Overhead pulleys: flexion x 3 minutes Supine: clasped hands overhead, ER and abduction x10  Seated shoulder circles and shrugs Manual: P/ROM into flexion, ER and abduction within protocol limits, isometrics with manual resistance ER and abduction    09/14/23 Findings from evaluation discussed, pt educated on plan of care, HEP initiated.     PATIENT EDUCATION: Education details: [email or web address], over the door pulleys, scar mobs on Rt   Person educated: Patient Education method: Explanation, Demonstration, and Handouts Education comprehension: verbalized understanding and returned demonstration  HOME EXERCISE PROGRAM: Access Code: [email or web address] URL: [email or web address] Date: 09/14/2023 Prepared by: Burnard  Exercises - Seated Shoulder Shrug Circles AROM Backward  - 5 x daily - 7 x weekly - 1 sets - 10 reps - Seated Shoulder Flexion Towel Slide at Table Top  - 2-3 x daily - 7 x weekly - 1 sets - 10 reps - Seated Elbow Flexion and Extension AROM  - 3 x daily - 7 x weekly - 1 sets - 10 reps - Supine Shoulder Flexion AAROM with Hands Clasped  - 1 x daily - 7 x weekly - 3 sets - 10 reps - Supine Shoulder External Rotation with Dowel  - 2 x daily - 7 x weekly - 1 sets - 10 reps - 10 hold  ASSESSMENT:  CLINICAL IMPRESSION: First time follow-up after evaluation. Pt is doing well with exercises per protocol.  He has pulleys at home and is  using them for flexion.  He demonstrates improved A/AROM movement.  He will continue to work on A/ROM at home and PT will add isometrics abduction and ER next week.  PT provided verbal and tactile cueing with exercise.  Patient will benefit from skilled PT to address the below impairments and improve overall function.    OBJECTIVE IMPAIRMENTS: decreased ROM, decreased strength, increased edema, increased fascial restrictions, impaired perceived functional ability, increased muscle spasms, impaired flexibility, impaired UE functional use, postural dysfunction, and pain.   ACTIVITY LIMITATIONS: carrying, lifting, dressing, self feeding, reach over head, and hygiene/grooming  PARTICIPATION LIMITATIONS: meal prep, cleaning, laundry, driving, community activity, and yard work  PERSONAL FACTORS: Age and 1 comorbidity: s/p Rt TSA are also affecting patient's functional outcome.   REHAB POTENTIAL: Good  CLINICAL DECISION MAKING: Stable/uncomplicated  EVALUATION COMPLEXITY: Low  GOALS: Goals reviewed with patient? Yes  SHORT TERM GOALS: Target date: 10/12/2023    Be independent in initial HEP Baseline: Goal status: INITIAL  2.  Demonstrate Rt shoulder P/ROM flexion to > or = to 100 degrees to promote overhead reaching  Baseline:  Goal  status: INITIAL  3.  Improve QuickDASH to < or = to 68% disability  Baseline: 79%  Goal status: INITIAL  4.  Wean from sling and report > or = to 30% use of Rt UE with ADLs and dressing  Baseline:  Goal status: INITIAL  5.  Demonstrate Rt shoulder P/ROM ER to > or = to 45 degrees to improve reaching behind head  Baseline: 15 Goal status: INITIAL    LONG TERM GOALS: Target date: 11/09/2023    Be independent in advanced HEP Baseline:  Goal status: INITIAL  2.  Improve QuickDASH to < or = to 50% disability for use with functional tasks  Baseline: 79%  Goal status: INITIAL  3.  Demonstrate Rt shoulder A/ROM flexion to > or = to 110 degrees for  reaching overhead Baseline:  Goal status: INITIAL  4.  Report > or = to 60% use of Rt UE with ADLs and self-care due to improve ROM and functional strength  Baseline:  Goal status: INITIAL  5.  Demonstrate Rt UE A/ROM ER to occiput to aid with self-care Baseline:  Goal status: INITIAL    PLAN: PT FREQUENCY: 2x/week  PT DURATION: 8 weeks  PLANNED INTERVENTIONS: 97110-Therapeutic exercises, 97530- Therapeutic activity, 97112- Neuromuscular re-education, 97535- Self Care, 02859- Manual therapy, [phone]- Canalith repositioning, [id number]- Aquatic Therapy, G0283- Electrical stimulation (unattended), 20560 (1-2 muscles), 20561 (3+ muscles)- Dry Needling, Patient/Family education, Taping, Joint mobilization, Scar mobilization, Cryotherapy, and Moist heat  PLAN FOR NEXT SESSION: see if pt got pulleys and teach how to use, review HEP, scar mobs, P/ROM  Burnard Joy, PT 09/16/23 4:20 PM

## 2023-09-21 ENCOUNTER — Ambulatory Visit

## 2023-09-21 DIAGNOSIS — R293 Abnormal posture: Secondary | ICD-10-CM

## 2023-09-21 DIAGNOSIS — M25511 Pain in right shoulder: Secondary | ICD-10-CM | POA: Diagnosis not present

## 2023-09-21 DIAGNOSIS — M6281 Muscle weakness (generalized): Secondary | ICD-10-CM | POA: Diagnosis not present

## 2023-09-21 DIAGNOSIS — M25611 Stiffness of right shoulder, not elsewhere classified: Secondary | ICD-10-CM | POA: Diagnosis not present

## 2023-09-21 NOTE — Therapy (Signed)
 OUTPATIENT PHYSICAL THERAPY TREATMENT   Patient Name: Andrew Moran MRN: [id number] DOB:16-Mar-1946, 78 y.o., male Today's Date: 09/21/2023  END OF SESSION:  PT End of Session - 09/21/23 1611     Visit Number 3    Date for PT Re-Evaluation 11/09/23    Authorization Type [employer] Medicare- auth requested    Progress Note Due on Visit 10    PT Start Time 1532    PT Stop Time 1612    PT Time Calculation (min) 40 min    Activity Tolerance Patient tolerated treatment well    Behavior During Therapy [facility] for tasks assessed/performed            Past Medical History:  Diagnosis Date   Arthritis    Asthma    pulmology-- dr wert--- cough varient versus uacs   GERD (gastroesophageal reflux disease)    per pt takes DGL supplement   History of diverticulitis of colon 2002   managed medically , no surgical intervention   History of DVT of lower extremity 06/27/2019   followed by dr g. loni--  completed 3 months xarelto  07/ 2021   History of kidney stones    Renal calculi    left side   Past Surgical History:  Procedure Laterality Date   ANKLE HARDWARE REMOVAL Right 2008   CARPAL TUNNEL RELEASE Bilateral left 1983;  right 1984   CYSTOSCOPY WITH RETROGRADE PYELOGRAM, URETEROSCOPY AND STENT PLACEMENT Left 02/23/2020   Procedure: CYSTOSCOPY WITH RETROGRADE PYELOGRAM, URETEROSCOPY, LITHOTRIPSY, STONE EXTRACTION,  AND STENT PLACEMENT;  Surgeon: Matilda Senior, MD;  Location: [facility];  Service: Urology;  Laterality: Left;   CYSTOSCOPY/RETROGRADE/URETEROSCOPY/STONE EXTRACTION WITH BASKET Bilateral 10/10/2013   Procedure: CYSTOSCOPY/URETEROSCOPY/STONE EXTRACTION WITH HOLMIUM LASER, bilateral retrograde, bilateral ureteral stents;  Surgeon: Senior CHRISTELLA Matilda, MD;  Location: [facility];  Service: Urology;  Laterality: Bilateral;   EXPLORATORY LAPAROTOMY  1970   EXPLORATORY STOMACH SURGERY FOR TEAR IN STOMACH WALL   EXTRACORPOREAL SHOCK WAVE LITHOTRIPSY Left 10/06/2019    Procedure: EXTRACORPOREAL SHOCK WAVE LITHOTRIPSY (ESWL);  Surgeon: Elisabeth Valli BIRCH, MD;  Location: [facility];  Service: Urology;  Laterality: Left;   EXTRACORPOREAL SHOCK WAVE LITHOTRIPSY  multiple since 2002,  last one 02-28-2013   HOLMIUM LASER APPLICATION Left 10/10/2013   Procedure: HOLMIUM LASER APPLICATION;  Surgeon: Senior CHRISTELLA Matilda, MD;  Location: [facility];  Service: Urology;  Laterality: Left;   KNEE ARTHROSCOPY  02/05/2011   Procedure: ARTHROSCOPY KNEE;  Surgeon: Lynwood SQUIBB Aplington;  Location: [facility];  Service: Orthopedics;  Laterality: Right;  Right knee arthroscopy partial lateral and medial meniscectomy with condyle debridement   LUMBAR SPINE SURGERY  x2  1995   ORIF ANKLE FRACTURE Right 1988   ROTATOR CUFF REPAIR Bilateral right 02/16/2018;  left 1997   TONSILLECTOMY AND ADENOIDECTOMY  1951   TOTAL KNEE ARTHROPLASTY Left 04/08/2019   Procedure: TOTAL KNEE ARTHROPLASTY;  Surgeon: Gerome Charleston, MD;  Location: [facility];  Service: Orthopedics;  Laterality: Left;  with adductor canal   Patient Active Problem List   Diagnosis Date Noted   Cellulitis 08/17/2023   Asthma exacerbation 04/22/2021   History of total knee arthroplasty 04/08/2019   DOE (dyspnea on exertion) 11/09/2018   Cough variant asthma vs UACS  06/27/2016    PCP: Kip Righter, MD  REFERRING PROVIDER: Melita Drivers, MD  REFERRING DIAG:  Diagnosis  M25.511 (ICD-10-CM) - Pain in right shoulder  S/p Rt reverse total shoulder replacement   THERAPY DIAG:  Acute pain  of right shoulder  Stiffness of right shoulder, not elsewhere classified  Muscle weakness (generalized)  Abnormal posture  Rationale for Evaluation and Treatment: Rehabilitation  ONSET DATE: surgery 08/11/23  SUBJECTIVE:                                                                                                                                                                                      SUBJECTIVE  STATEMENT: I've got pulleys and I do it 3x/day.  It stiffens back up after I do it.  I'm cutting up my food now and sleeping in the bed and not recliner.    From eval: Pt presents to PT s/p Rt reverse total shoulder replacement performed 08/11/23.  [place] and had resultant torn rotator cuff and this lead to surgery. He is wearing sling just when out of the house.    Hand dominance: Right  PERTINENT HISTORY: Lumbar surgery x 2 in 1995, ORIF Rt ankle, TKA Lt 2021, fall in airport 06/2023 with concussion, rib fracture and Rt shoulder injury, Rt reverse TSA 08/11/23  PAIN:09/21/23 Are you having pain? Yes: NPRS scale: 0-7/10 Pain location: Rt shoulder  Pain description: stabbing, aching Aggravating factors: moving Rt UE Relieving factors: pain meds, rest, ice  PRECAUTIONS: Other: follow post-op protocol  RED FLAGS: None   WEIGHT BEARING RESTRICTIONS: No  FALLS:  Has patient fallen in last 6 months? Yes. Number of falls prior to surgery- torn rotator cuff and labrum  LIVING ENVIRONMENT: Lives with: lives with their spouse Lives in: House/apartment Stairs: No Has following equipment at home: Single point cane    PLOF: Independent and Leisure: travel  PATIENT GOALS: return to prior level of function, use Rt UE   NEXT MD VISIT: 10/07/23  OBJECTIVE:  Note: Objective measures were completed at Evaluation unless otherwise noted.  DIAGNOSTIC FINDINGS:  NA  PATIENT SURVEYS :    Quick Dash Disability/Symptom Score:= 79.5% disability   Minimally Clinically Important Difference (MCID): 15-20 points    COGNITION: Overall cognitive status: Within functional limits for tasks assessed     SENSATION: WFL  POSTURE: Forward head/rounded shoulders   UPPER EXTREMITY ROM:   Passive ROM Right eval Left eval  Shoulder flexion 60 WFLs   Shoulder extension    Shoulder abduction 50   Shoulder adduction    Shoulder internal rotation 30   Shoulder  external rotation 15   Elbow flexion    Elbow extension    Wrist flexion    Wrist extension    Wrist ulnar deviation    Wrist radial deviation    Wrist pronation    Wrist supination    (  Blank rows = not tested)  UPPER EXTREMITY MMT: Not tested due to post-op restrictions  JOINT MOBILITY TESTING:  NA- post op  PALPATION:  Healing surgical incision over Rt anterior aspect of shoulder.  Reduced mobility and tenderness                                                                                                                              TREATMENT DATE:   09/21/23 UE Ranger: at steps: flexion and circles 2x5 each Overhead pulleys: flexion x 3 minutes Supine: clasped hands overhead, ER and abduction x10  Seated shoulder circles and shrugs Isometrics: abduction and ER 5 hold 2x5 Rt upper trap stretch 2x20 seconds  Table slides: Rt 2x5 with 5 hold  Manual: P/ROM into flexion, ER and abduction within protocol limits, isometrics with manual resistance ER and abduction    09/16/23 UE Ranger: seated: flexion and circles 2x5 each Overhead pulleys: flexion x 3 minutes Supine: clasped hands overhead, ER and abduction x10  Seated shoulder circles and shrugs Manual: P/ROM into flexion, ER and abduction within protocol limits, isometrics with manual resistance ER and abduction    09/14/23 Findings from evaluation discussed, pt educated on plan of care, HEP initiated.     PATIENT EDUCATION: Education details: [email or web address], over the door pulleys, scar mobs on Rt   Person educated: Patient Education method: Explanation, Demonstration, and Handouts Education comprehension: verbalized understanding and returned demonstration  HOME EXERCISE PROGRAM: Access Code: [email or web address] URL: [email or web address] Date: 09/14/2023 Prepared by: Burnard  Exercises - Seated Shoulder Shrug Circles AROM Backward  - 5 x daily - 7 x weekly - 1 sets - 10 reps - Seated Shoulder Flexion Towel  Slide at Table Top  - 2-3 x daily - 7 x weekly - 1 sets - 10 reps - Seated Elbow Flexion and Extension AROM  - 3 x daily - 7 x weekly - 1 sets - 10 reps - Supine Shoulder Flexion AAROM with Hands Clasped  - 1 x daily - 7 x weekly - 3 sets - 10 reps - Supine Shoulder External Rotation with Dowel  - 2 x daily - 7 x weekly - 1 sets - 10 reps - 10 hold  ASSESSMENT:  CLINICAL IMPRESSION: Pt is doing well with exercises per protocol.  He has pulleys at home and is using them for flexion and this is challenging for pt.  He demonstrates improved A/AROM movement.   PT added to HEP for ER and abduction isometrics.  He is weaning from sling at home.  Pain and guarding with ER on the Rt with P/ROM.  Educated pt on correct alignment with A/AROM ER with cane to maximize stretch.  PT provided verbal and tactile cueing with exercise.  Patient will benefit from skilled PT to address the below impairments and improve overall function.    OBJECTIVE IMPAIRMENTS: decreased ROM, decreased strength, increased edema, increased fascial restrictions, impaired perceived functional ability, increased muscle spasms, impaired flexibility,  impaired UE functional use, postural dysfunction, and pain.   ACTIVITY LIMITATIONS: carrying, lifting, dressing, self feeding, reach over head, and hygiene/grooming  PARTICIPATION LIMITATIONS: meal prep, cleaning, laundry, driving, community activity, and yard work  PERSONAL FACTORS: Age and 1 comorbidity: s/p Rt TSA are also affecting patient's functional outcome.   REHAB POTENTIAL: Good  CLINICAL DECISION MAKING: Stable/uncomplicated  EVALUATION COMPLEXITY: Low  GOALS: Goals reviewed with patient? Yes  SHORT TERM GOALS: Target date: 10/12/2023    Be independent in initial HEP Baseline: Goal status: INITIAL  2.  Demonstrate Rt shoulder P/ROM flexion to > or = to 100 degrees to promote overhead reaching  Baseline:  Goal status: INITIAL  3.  Improve QuickDASH to < or = to  68% disability  Baseline: 79%  Goal status: INITIAL  4.  Wean from sling and report > or = to 30% use of Rt UE with ADLs and dressing  Baseline:  Goal status: INITIAL  5.  Demonstrate Rt shoulder P/ROM ER to > or = to 45 degrees to improve reaching behind head  Baseline: 15 Goal status: INITIAL    LONG TERM GOALS: Target date: 11/09/2023    Be independent in advanced HEP Baseline:  Goal status: INITIAL  2.  Improve QuickDASH to < or = to 50% disability for use with functional tasks  Baseline: 79%  Goal status: INITIAL  3.  Demonstrate Rt shoulder A/ROM flexion to > or = to 110 degrees for reaching overhead Baseline:  Goal status: INITIAL  4.  Report > or = to 60% use of Rt UE with ADLs and self-care due to improve ROM and functional strength  Baseline:  Goal status: INITIAL  5.  Demonstrate Rt UE A/ROM ER to occiput to aid with self-care Baseline:  Goal status: INITIAL    PLAN: PT FREQUENCY: 2x/week  PT DURATION: 8 weeks  PLANNED INTERVENTIONS: 97110-Therapeutic exercises, 97530- Therapeutic activity, 97112- Neuromuscular re-education, 97535- Self Care, 02859- Manual therapy, [phone]- Canalith repositioning, [id number]- Aquatic Therapy, G0283- Electrical stimulation (unattended), 20560 (1-2 muscles), 20561 (3+ muscles)- Dry Needling, Patient/Family education, Taping, Joint mobilization, Scar mobilization, Cryotherapy, and Moist heat  PLAN FOR NEXT SESSION: see if pt got pulleys and teach how to use, review HEP, scar mobs, P/ROM  Burnard Joy, PT 09/21/23 4:13 PM

## 2023-09-23 ENCOUNTER — Ambulatory Visit

## 2023-09-23 DIAGNOSIS — M25611 Stiffness of right shoulder, not elsewhere classified: Secondary | ICD-10-CM | POA: Diagnosis not present

## 2023-09-23 DIAGNOSIS — M25511 Pain in right shoulder: Secondary | ICD-10-CM | POA: Diagnosis not present

## 2023-09-23 DIAGNOSIS — M6281 Muscle weakness (generalized): Secondary | ICD-10-CM | POA: Diagnosis not present

## 2023-09-23 DIAGNOSIS — R293 Abnormal posture: Secondary | ICD-10-CM

## 2023-09-23 NOTE — Therapy (Addendum)
 OUTPATIENT PHYSICAL THERAPY TREATMENT   Patient Name: Andrew Moran MRN: [id number] DOB:Jul 14, 1945, 78 y.o., male Today's Date: 09/23/2023  END OF SESSION:  PT End of Session - 09/23/23 1638     Visit Number 4    Date for PT Re-Evaluation 11/09/23    Authorization Type [employer] Medicare-16 vl approved : 09/14/2023 - 11/09/2023    Authorization - Visit Number 4    Authorization - Number of Visits 16    Progress Note Due on Visit 10    PT Start Time 1533    PT Stop Time 1611    PT Time Calculation (min) 38 min    Activity Tolerance Patient tolerated treatment well    Behavior During Therapy [facility] for tasks assessed/performed             Past Medical History:  Diagnosis Date   Arthritis    Asthma    pulmology-- dr wert--- cough varient versus uacs   GERD (gastroesophageal reflux disease)    per pt takes DGL supplement   History of diverticulitis of colon 2002   managed medically , no surgical intervention   History of DVT of lower extremity 06/27/2019   followed by dr g. loni--  completed 3 months xarelto  07/ 2021   History of kidney stones    Renal calculi    left side   Past Surgical History:  Procedure Laterality Date   ANKLE HARDWARE REMOVAL Right 2008   CARPAL TUNNEL RELEASE Bilateral left 1983;  right 1984   CYSTOSCOPY WITH RETROGRADE PYELOGRAM, URETEROSCOPY AND STENT PLACEMENT Left 02/23/2020   Procedure: CYSTOSCOPY WITH RETROGRADE PYELOGRAM, URETEROSCOPY, LITHOTRIPSY, STONE EXTRACTION,  AND STENT PLACEMENT;  Surgeon: Matilda Senior, MD;  Location: [facility];  Service: Urology;  Laterality: Left;   CYSTOSCOPY/RETROGRADE/URETEROSCOPY/STONE EXTRACTION WITH BASKET Bilateral 10/10/2013   Procedure: CYSTOSCOPY/URETEROSCOPY/STONE EXTRACTION WITH HOLMIUM LASER, bilateral retrograde, bilateral ureteral stents;  Surgeon: Senior CHRISTELLA Matilda, MD;  Location: [facility];  Service: Urology;  Laterality: Bilateral;   EXPLORATORY LAPAROTOMY  1970   EXPLORATORY  STOMACH SURGERY FOR TEAR IN STOMACH WALL   EXTRACORPOREAL SHOCK WAVE LITHOTRIPSY Left 10/06/2019   Procedure: EXTRACORPOREAL SHOCK WAVE LITHOTRIPSY (ESWL);  Surgeon: Elisabeth Valli BIRCH, MD;  Location: [facility];  Service: Urology;  Laterality: Left;   EXTRACORPOREAL SHOCK WAVE LITHOTRIPSY  multiple since 2002,  last one 02-28-2013   HOLMIUM LASER APPLICATION Left 10/10/2013   Procedure: HOLMIUM LASER APPLICATION;  Surgeon: Senior CHRISTELLA Matilda, MD;  Location: [facility];  Service: Urology;  Laterality: Left;   KNEE ARTHROSCOPY  02/05/2011   Procedure: ARTHROSCOPY KNEE;  Surgeon: Lynwood SQUIBB Aplington;  Location: [facility];  Service: Orthopedics;  Laterality: Right;  Right knee arthroscopy partial lateral and medial meniscectomy with condyle debridement   LUMBAR SPINE SURGERY  x2  1995   ORIF ANKLE FRACTURE Right 1988   ROTATOR CUFF REPAIR Bilateral right 02/16/2018;  left 1997   TONSILLECTOMY AND ADENOIDECTOMY  1951   TOTAL KNEE ARTHROPLASTY Left 04/08/2019   Procedure: TOTAL KNEE ARTHROPLASTY;  Surgeon: Gerome Charleston, MD;  Location: [facility];  Service: Orthopedics;  Laterality: Left;  with adductor canal   Patient Active Problem List   Diagnosis Date Noted   Cellulitis 08/17/2023   Asthma exacerbation 04/22/2021   History of total knee arthroplasty 04/08/2019   DOE (dyspnea on exertion) 11/09/2018   Cough variant asthma vs UACS  06/27/2016    PCP: Kip Righter, MD  REFERRING PROVIDER: Melita Drivers, MD  REFERRING DIAG:  Diagnosis  M25.511 (ICD-10-CM) - Pain in right shoulder  S/p Rt reverse total shoulder replacement   THERAPY DIAG:  Acute pain of right shoulder  Stiffness of right shoulder, not elsewhere classified  Muscle weakness (generalized)  Abnormal posture  Rationale for Evaluation and Treatment: Rehabilitation  ONSET DATE: surgery 08/11/23  SUBJECTIVE:                                                                                                                                                                                       SUBJECTIVE STATEMENT: Pt reports pain is 1-2 at rest, however 7/10 when moving.   From eval: Pt presents to PT s/p Rt reverse total shoulder replacement performed 08/11/23.  [place] and had resultant torn rotator cuff and this lead to surgery. He is wearing sling just when out of the house.    Hand dominance: Right  PERTINENT HISTORY: Lumbar surgery x 2 in 1995, ORIF Rt ankle, TKA Lt 2021, fall in airport 06/2023 with concussion, rib fracture and Rt shoulder injury, Rt reverse TSA 08/11/23  PAIN:09/23/23 Are you having pain? Yes: NPRS scale: 1-2/10 at rest, 7/10 during movement Pain location: Rt shoulder  Pain description: stabbing, aching Aggravating factors: moving Rt UE Relieving factors: pain meds, rest, ice  PRECAUTIONS: Other: follow post-op protocol  RED FLAGS: None   WEIGHT BEARING RESTRICTIONS: No  FALLS:  Has patient fallen in last 6 months? Yes. Number of falls prior to surgery- torn rotator cuff and labrum  LIVING ENVIRONMENT: Lives with: lives with their spouse Lives in: House/apartment Stairs: No Has following equipment at home: Single point cane    PLOF: Independent and Leisure: travel  PATIENT GOALS: return to prior level of function, use Rt UE   NEXT MD VISIT: 10/07/23  OBJECTIVE:  Note: Objective measures were completed at Evaluation unless otherwise noted.  DIAGNOSTIC FINDINGS:  NA  PATIENT SURVEYS :  Quick Dash Disability/Symptom Score:= 79.5% disability   Minimally Clinically Important Difference (MCID): 15-20 points    COGNITION: Overall cognitive status: Within functional limits for tasks assessed     SENSATION: WFL  POSTURE: Forward head/rounded shoulders   UPPER EXTREMITY ROM:   Passive ROM Right eval Left eval  Shoulder flexion 60 WFLs   Shoulder extension    Shoulder abduction 50   Shoulder adduction    Shoulder internal  rotation 30   Shoulder external rotation 15   Elbow flexion    Elbow extension    Wrist flexion    Wrist extension    Wrist ulnar deviation    Wrist radial deviation    Wrist pronation    Wrist  supination    (Blank rows = not tested)  UPPER EXTREMITY MMT: Not tested due to post-op restrictions  JOINT MOBILITY TESTING:  NA- post op  PALPATION:  Healing surgical incision over Rt anterior aspect of shoulder.  Reduced mobility and tenderness                                                                                                                              TREATMENT DATE:   09/23/23 UE Ranger: at steps: flexion and circles 2x5 each Isometrics: Rt abduction and ER 5 hold 2x5 Overhead pulleys: flexion x 3 minutes Seated ER with cane- tactile cues for alignment and motion x10 Manual: P/ROM into flexion, ER and abduction within protocol limits, isometrics with manual resistance ER and abduction  Supine: clasped hands overhead, ER and abduction x10    09/21/23 UE Ranger: at steps: flexion and circles 2x5 each Overhead pulleys: flexion x 3 minutes Supine: clasped hands overhead, ER and abduction x10  Seated shoulder circles and shrugs Isometrics: abduction and ER 5 hold 2x5 Rt upper trap stretch 2x20 seconds  Table slides: Rt 2x5 with 5 hold  Manual: P/ROM into flexion, ER and abduction within protocol limits, isometrics with manual resistance ER and abduction    09/16/23 UE Ranger: seated: flexion and circles 2x5 each Overhead pulleys: flexion x 3 minutes Supine: clasped hands overhead, ER and abduction x10  Seated shoulder circles and shrugs Manual: P/ROM into flexion, ER and abduction within protocol limits, isometrics with manual resistance ER and abduction    PATIENT EDUCATION: Education details: [email or web address], over the door pulleys, scar mobs on Rt   Person educated: Patient Education method: Explanation, Demonstration, and Handouts Education comprehension:  verbalized understanding and returned demonstration  HOME EXERCISE PROGRAM: Access Code: [email or web address] URL: [email or web address] Date: 09/14/2023 Prepared by: Burnard  Exercises - Seated Shoulder Shrug Circles AROM Backward  - 5 x daily - 7 x weekly - 1 sets - 10 reps - Seated Shoulder Flexion Towel Slide at Table Top  - 2-3 x daily - 7 x weekly - 1 sets - 10 reps - Seated Elbow Flexion and Extension AROM  - 3 x daily - 7 x weekly - 1 sets - 10 reps - Supine Shoulder Flexion AAROM with Hands Clasped  - 1 x daily - 7 x weekly - 3 sets - 10 reps - Supine Shoulder External Rotation with Dowel  - 2 x daily - 7 x weekly - 1 sets - 10 reps - 10 hold  ASSESSMENT:  CLINICAL IMPRESSION:  Today's session started with a series of shoulder strengthening and mobility exercises to promote independence with ADL's and functional activities. PT assisted pt with verbal and tactile cues during standing exercises to enhance the quality in technique and to decrease patient pain. Session ended with manual therapy performed during pt's active shoulder movement. Pt required several verbal and tactile cues when performing shoulder external rotation, to properly complete the motion. Plan to  progress shoulder strengthening and mobility exercises as protocol allows, performing manual assistance and manual therapy as needed for next session. Patient will benefit from skilled PT to address the below impairments and improve overall function.     OBJECTIVE IMPAIRMENTS: decreased ROM, decreased strength, increased edema, increased fascial restrictions, impaired perceived functional ability, increased muscle spasms, impaired flexibility, impaired UE functional use, postural dysfunction, and pain.   ACTIVITY LIMITATIONS: carrying, lifting, dressing, self feeding, reach over head, and hygiene/grooming  PARTICIPATION LIMITATIONS: meal prep, cleaning, laundry, driving, community activity, and yard work  PERSONAL  FACTORS: Age and 1 comorbidity: s/p Rt TSA are also affecting patient's functional outcome.   REHAB POTENTIAL: Good  CLINICAL DECISION MAKING: Stable/uncomplicated  EVALUATION COMPLEXITY: Low  GOALS: Goals reviewed with patient? Yes  SHORT TERM GOALS: Target date: 10/12/2023    Be independent in initial HEP Baseline: has initial HEP (09/23/23) Goal status: IN PROGRESS  2.  Demonstrate Rt shoulder P/ROM flexion to > or = to 100 degrees to promote overhead reaching  Baseline:  Goal status: INITIAL  3.  Improve QuickDASH to < or = to 68% disability  Baseline: 79%  Goal status: INITIAL  4.  Wean from sling and report > or = to 30% use of Rt UE with ADLs and dressing  Baseline:  Goal status: INITIAL  5.  Demonstrate Rt shoulder P/ROM ER to > or = to 45 degrees to improve reaching behind head  Baseline: 15 Goal status: INITIAL    LONG TERM GOALS: Target date: 11/09/2023    Be independent in advanced HEP Baseline:  Goal status: INITIAL  2.  Improve QuickDASH to < or = to 50% disability for use with functional tasks  Baseline: 79%  Goal status: INITIAL  3.  Demonstrate Rt shoulder A/ROM flexion to > or = to 110 degrees for reaching overhead Baseline:  Goal status: INITIAL  4.  Report > or = to 60% use of Rt UE with ADLs and self-care due to improve ROM and functional strength  Baseline:  Goal status: INITIAL  5.  Demonstrate Rt UE A/ROM ER to occiput to aid with self-care Baseline:  Goal status: INITIAL    PLAN: PT FREQUENCY: 2x/week  PT DURATION: 8 weeks  PLANNED INTERVENTIONS: 97110-Therapeutic exercises, 97530- Therapeutic activity, [id number]- Neuromuscular re-education, 97535- Self Care, 02859- Manual therapy, [phone]- Canalith repositioning, [id number]- Aquatic Therapy, [id number]- Electrical stimulation (unattended), 20560 (1-2 muscles), 20561 (3+ muscles)- Dry Needling, Patient/Family education, Taping, Joint mobilization, Scar mobilization, Cryotherapy, and Moist  heat  PLAN FOR NEXT SESSION:  review HEP, scar mobs, P/ROM. Measure next session. Lavanda Cleverly, SPT 09/23/23 4:47 PM  I agree with the following treatment note after reviewing documentation. This session was performed under the supervision of a licensed clinician.  Burnard Joy, PT 09/23/23 4:47 PM

## 2023-09-28 ENCOUNTER — Encounter: Payer: Self-pay | Admitting: Physical Therapy

## 2023-09-28 ENCOUNTER — Ambulatory Visit: Admitting: Physical Therapy

## 2023-09-28 DIAGNOSIS — M25511 Pain in right shoulder: Secondary | ICD-10-CM

## 2023-09-28 DIAGNOSIS — R293 Abnormal posture: Secondary | ICD-10-CM

## 2023-09-28 DIAGNOSIS — M6281 Muscle weakness (generalized): Secondary | ICD-10-CM

## 2023-09-28 DIAGNOSIS — M25611 Stiffness of right shoulder, not elsewhere classified: Secondary | ICD-10-CM

## 2023-09-28 NOTE — Therapy (Signed)
 OUTPATIENT PHYSICAL THERAPY TREATMENT   Patient Name: Andrew Moran MRN: [id number] DOB:[birth year], 78 y.o., male Today's Date: 09/28/2023  END OF SESSION:  PT End of Session - 09/28/23 1440     Visit Number 5    Date for PT Re-Evaluation 11/09/23    Authorization Type [employer]HC Medicare-16 vl approved : 09/14/2023 - 11/09/2023    Authorization - Visit Number 5    Authorization - Number of Visits 16    Progress Note Due on Visit 10    PT Start Time 1445    PT Stop Time 1530    PT Time Calculation (min) 45 min    Activity Tolerance Patient tolerated treatment well    Behavior During Therapy [facility] for tasks assessed/performed              Past Medical History:  Diagnosis Date   Arthritis    Asthma    pulmology-- dr wert--- cough varient versus uacs   GERD (gastroesophageal reflux disease)    per pt takes DGL supplement   History of diverticulitis of colon 2002   managed medically , no surgical intervention   History of DVT of lower extremity 06/27/2019   followed by dr g. loni--  completed 3 months xarelto  07/ 2021   History of kidney stones    Renal calculi    left side   Past Surgical History:  Procedure Laterality Date   ANKLE HARDWARE REMOVAL Right 2008   CARPAL TUNNEL RELEASE Bilateral left 1983;  right 1984   CYSTOSCOPY WITH RETROGRADE PYELOGRAM, URETEROSCOPY AND STENT PLACEMENT Left 02/23/2020   Procedure: CYSTOSCOPY WITH RETROGRADE PYELOGRAM, URETEROSCOPY, LITHOTRIPSY, STONE EXTRACTION,  AND STENT PLACEMENT;  Surgeon: Matilda Senior, MD;  Location: [facility];  Service: Urology;  Laterality: Left;   CYSTOSCOPY/RETROGRADE/URETEROSCOPY/STONE EXTRACTION WITH BASKET Bilateral 10/10/2013   Procedure: CYSTOSCOPY/URETEROSCOPY/STONE EXTRACTION WITH HOLMIUM LASER, bilateral retrograde, bilateral ureteral stents;  Surgeon: Senior CHRISTELLA Matilda, MD;  Location: [facility];  Service: Urology;  Laterality: Bilateral;   EXPLORATORY LAPAROTOMY  1970    EXPLORATORY STOMACH SURGERY FOR TEAR IN STOMACH WALL   EXTRACORPOREAL SHOCK WAVE LITHOTRIPSY Left 10/06/2019   Procedure: EXTRACORPOREAL SHOCK WAVE LITHOTRIPSY (ESWL);  Surgeon: Elisabeth Valli BIRCH, MD;  Location: [facility];  Service: Urology;  Laterality: Left;   EXTRACORPOREAL SHOCK WAVE LITHOTRIPSY  multiple since 2002,  last one 02-28-2013   HOLMIUM LASER APPLICATION Left 10/10/2013   Procedure: HOLMIUM LASER APPLICATION;  Surgeon: Senior CHRISTELLA Matilda, MD;  Location: [facility];  Service: Urology;  Laterality: Left;   KNEE ARTHROSCOPY  02/05/2011   Procedure: ARTHROSCOPY KNEE;  Surgeon: Lynwood SQUIBB Aplington;  Location: [facility];  Service: Orthopedics;  Laterality: Right;  Right knee arthroscopy partial lateral and medial meniscectomy with condyle debridement   LUMBAR SPINE SURGERY  x2  1995   ORIF ANKLE FRACTURE Right 1988   ROTATOR CUFF REPAIR Bilateral right 02/16/2018;  left 1997   TONSILLECTOMY AND ADENOIDECTOMY  1951   TOTAL KNEE ARTHROPLASTY Left 04/08/2019   Procedure: TOTAL KNEE ARTHROPLASTY;  Surgeon: Gerome Charleston, MD;  Location: [facility];  Service: Orthopedics;  Laterality: Left;  with adductor canal   Patient Active Problem List   Diagnosis Date Noted   Cellulitis 08/17/2023   Asthma exacerbation 04/22/2021   History of total knee arthroplasty 04/08/2019   DOE (dyspnea on exertion) 11/09/2018   Cough variant asthma vs UACS  06/27/2016    PCP: Kip Righter, MD  REFERRING PROVIDER: Melita Drivers, MD  REFERRING DIAG:  Diagnosis  M25.511 (ICD-10-CM) - Pain in right shoulder  S/p Rt reverse total shoulder replacement   THERAPY DIAG:  Acute pain of right shoulder  Stiffness of right shoulder, not elsewhere classified  Muscle weakness (generalized)  Abnormal posture  Rationale for Evaluation and Treatment: Rehabilitation  ONSET DATE: surgery 08/11/23  SUBJECTIVE:                                                                                                                                                                                       SUBJECTIVE STATEMENT: Pt reports pain is 0/10 at rest, up to 6-710  From eval: Pt presents to PT s/p Rt reverse total shoulder replacement performed 08/11/23.  [place] and had resultant torn rotator cuff and this lead to surgery. He is wearing sling just when out of the house.    Hand dominance: Right  PERTINENT HISTORY: Lumbar surgery x 2 in 1995, ORIF Rt ankle, TKA Lt 2021, fall in airport 06/2023 with concussion, rib fracture and Rt shoulder injury, Rt reverse TSA 08/11/23  PAIN:09/23/23 Are you having pain? Yes: NPRS scale: 1-2/10 at rest, 7/10 during movement Pain location: Rt shoulder  Pain description: stabbing, aching Aggravating factors: moving Rt UE Relieving factors: pain meds, rest, ice  PRECAUTIONS: Other: follow post-op protocol  RED FLAGS: None   WEIGHT BEARING RESTRICTIONS: No  FALLS:  Has patient fallen in last 6 months? Yes. Number of falls prior to surgery- torn rotator cuff and labrum  LIVING ENVIRONMENT: Lives with: lives with their spouse Lives in: House/apartment Stairs: No Has following equipment at home: Single point cane    PLOF: Independent and Leisure: travel  PATIENT GOALS: return to prior level of function, use Rt UE   NEXT MD VISIT: 10/07/23  OBJECTIVE:  Note: Objective measures were completed at Evaluation unless otherwise noted.  DIAGNOSTIC FINDINGS:  NA  PATIENT SURVEYS :  Quick Dash Disability/Symptom Score:= 79.5% disability   Minimally Clinically Important Difference (MCID): 15-20 points    COGNITION: Overall cognitive status: Within functional limits for tasks assessed     SENSATION: WFL  POSTURE: Forward head/rounded shoulders   UPPER EXTREMITY ROM:   Passive ROM Right eval Left eval  Shoulder flexion 60 WFLs   Shoulder extension    Shoulder abduction 50   Shoulder adduction    Shoulder internal  rotation 30   Shoulder external rotation 15   Elbow flexion    Elbow extension    Wrist flexion    Wrist extension    Wrist ulnar deviation    Wrist radial deviation    Wrist pronation    Wrist  supination    (Blank rows = not tested)  UPPER EXTREMITY MMT: Not tested due to post-op restrictions  JOINT MOBILITY TESTING:  NA- post op  PALPATION:  Healing surgical incision over Rt anterior aspect of shoulder.  Reduced mobility and tenderness                                                                                                                              TREATMENT DATE:   09/28/23 Pulleys flexion x 2 min  Seated mobilization with pillowcase for relaxation Standing with noodle along spine: scap retraction ---> ER x 20 B UE Ranger: at steps: flexion, ER and circles  Seated OH flexion with hands clasped x 15; also done after manual therapy Seated ER with cane- x 5 Manual:STM and IASTM to R UT, pecs, biceps, use of ball to pectorals for STM/HEP; scar massage Supine: elbow extension stretch (towel rolls supporting upper arm)  09/23/23 UE Ranger: at steps: flexion and circles 2x5 each Isometrics: Rt abduction and ER 5 hold 2x5 Overhead pulleys: flexion x 3 minutes Seated ER with cane- tactile cues for alignment and motion x10 Manual: P/ROM into flexion, ER and abduction within protocol limits, isometrics with manual resistance ER and abduction  Supine: clasped hands overhead, ER and abduction x10    09/21/23 UE Ranger: at steps: flexion and circles 2x5 each Overhead pulleys: flexion x 3 minutes Supine: clasped hands overhead, ER and abduction x10  Seated shoulder circles and shrugs Isometrics: abduction and ER 5 hold 2x5 Rt upper trap stretch 2x20 seconds  Table slides: Rt 2x5 with 5 hold  Manual: P/ROM into flexion, ER and abduction within protocol limits, isometrics with manual resistance ER and abduction    09/16/23 UE Ranger: seated: flexion and circles 2x5  each Overhead pulleys: flexion x 3 minutes Supine: clasped hands overhead, ER and abduction x10  Seated shoulder circles and shrugs Manual: P/ROM into flexion, ER and abduction within protocol limits, isometrics with manual resistance ER and abduction    PATIENT EDUCATION: Education details: [email or web address], over the door pulleys, scar mobs on Rt   Person educated: Patient Education method: Explanation, Demonstration, and Handouts Education comprehension: verbalized understanding and returned demonstration  HOME EXERCISE PROGRAM: Access Code: [email or web address] URL: [email or web address] Date: 09/14/2023 Prepared by: Burnard  Exercises - Seated Shoulder Shrug Circles AROM Backward  - 5 x daily - 7 x weekly - 1 sets - 10 reps - Seated Shoulder Flexion Towel Slide at Table Top  - 2-3 x daily - 7 x weekly - 1 sets - 10 reps - Seated Elbow Flexion and Extension AROM  - 3 x daily - 7 x weekly - 1 sets - 10 reps - Supine Shoulder Flexion AAROM with Hands Clasped  - 1 x daily - 7 x weekly - 3 sets - 10 reps - Supine Shoulder External Rotation with Dowel  - 2 x daily - 7 x weekly - 1 sets - 10 reps -  10 hold  ASSESSMENT:  CLINICAL IMPRESSION:  Patient complaining of sharp pain in anterior shoulder as well as neck pain during some exercises today and at home. Addressed with manual therapy to R UQ with excellent results. Patient reports decreased pain and demonstrated improved ease of B OH flexion at end of session with no UT compensation.     OBJECTIVE IMPAIRMENTS: decreased ROM, decreased strength, increased edema, increased fascial restrictions, impaired perceived functional ability, increased muscle spasms, impaired flexibility, impaired UE functional use, postural dysfunction, and pain.   ACTIVITY LIMITATIONS: carrying, lifting, dressing, self feeding, reach over head, and hygiene/grooming  PARTICIPATION LIMITATIONS: meal prep, cleaning, laundry, driving, community activity, and yard  work  PERSONAL FACTORS: Age and 1 comorbidity: s/p Rt TSA are also affecting patient's functional outcome.   REHAB POTENTIAL: Good  CLINICAL DECISION MAKING: Stable/uncomplicated  EVALUATION COMPLEXITY: Low  GOALS: Goals reviewed with patient? Yes  SHORT TERM GOALS: Target date: 10/12/2023    Be independent in initial HEP Baseline: has initial HEP (09/23/23) Goal status: IN PROGRESS  2.  Demonstrate Rt shoulder P/ROM flexion to > or = to 100 degrees to promote overhead reaching  Baseline:  Goal status: INITIAL  3.  Improve QuickDASH to < or = to 68% disability  Baseline: 79%  Goal status: INITIAL  4.  Wean from sling and report > or = to 30% use of Rt UE with ADLs and dressing  Baseline:  Goal status: INITIAL  5.  Demonstrate Rt shoulder P/ROM ER to > or = to 45 degrees to improve reaching behind head  Baseline: 15 Goal status: INITIAL    LONG TERM GOALS: Target date: 11/09/2023    Be independent in advanced HEP Baseline:  Goal status: INITIAL  2.  Improve QuickDASH to < or = to 50% disability for use with functional tasks  Baseline: 79%  Goal status: INITIAL  3.  Demonstrate Rt shoulder A/ROM flexion to > or = to 110 degrees for reaching overhead Baseline:  Goal status: INITIAL  4.  Report > or = to 60% use of Rt UE with ADLs and self-care due to improve ROM and functional strength  Baseline:  Goal status: INITIAL  5.  Demonstrate Rt UE A/ROM ER to occiput to aid with self-care Baseline:  Goal status: INITIAL    PLAN: PT FREQUENCY: 2x/week  PT DURATION: 8 weeks  PLANNED INTERVENTIONS: 97110-Therapeutic exercises, 97530- Therapeutic activity, [id number]- Neuromuscular re-education, 97535- Self Care, 02859- Manual therapy, [phone]- Canalith repositioning, [id number]- Aquatic Therapy, [id number]- Electrical stimulation (unattended), 20560 (1-2 muscles), 20561 (3+ muscles)- Dry Needling, Patient/Family education, Taping, Joint mobilization, Scar mobilization, Cryotherapy,  and Moist heat  PLAN FOR NEXT SESSION:  continue with STM prn. review HEP, scar mobs, P/ROM. Measure next session.   Mliss Cummins, PT

## 2023-09-30 ENCOUNTER — Ambulatory Visit: Admitting: Physical Therapy

## 2023-09-30 ENCOUNTER — Encounter: Payer: Self-pay | Admitting: Physical Therapy

## 2023-09-30 DIAGNOSIS — M25511 Pain in right shoulder: Secondary | ICD-10-CM

## 2023-09-30 DIAGNOSIS — R293 Abnormal posture: Secondary | ICD-10-CM | POA: Diagnosis not present

## 2023-09-30 DIAGNOSIS — M6281 Muscle weakness (generalized): Secondary | ICD-10-CM | POA: Diagnosis not present

## 2023-09-30 DIAGNOSIS — M25611 Stiffness of right shoulder, not elsewhere classified: Secondary | ICD-10-CM | POA: Diagnosis not present

## 2023-09-30 NOTE — Therapy (Signed)
 OUTPATIENT PHYSICAL THERAPY TREATMENT/MD NOTE   Patient Name: Andrew Moran MRN: [id number] DOB:09-28-1945, 78 y.o., male Today's Date: 09/30/2023  END OF SESSION:  PT End of Session - 09/30/23 1527     Visit Number 6    Date for PT Re-Evaluation 11/09/23    Authorization Type [employer] Medicare-16 vl approved : 09/14/2023 - 11/09/2023    Authorization - Visit Number 6    Authorization - Number of Visits 16    Progress Note Due on Visit 10    PT Start Time 1530    PT Stop Time 1618    PT Time Calculation (min) 48 min    Behavior During Therapy [facility] for tasks assessed/performed              Past Medical History:  Diagnosis Date   Arthritis    Asthma    pulmology-- dr wert--- cough varient versus uacs   GERD (gastroesophageal reflux disease)    per pt takes DGL supplement   History of diverticulitis of colon 2002   managed medically , no surgical intervention   History of DVT of lower extremity 06/27/2019   followed by dr g. loni--  completed 3 months xarelto  07/ 2021   History of kidney stones    Renal calculi    left side   Past Surgical History:  Procedure Laterality Date   ANKLE HARDWARE REMOVAL Right 2008   CARPAL TUNNEL RELEASE Bilateral left 1983;  right 1984   CYSTOSCOPY WITH RETROGRADE PYELOGRAM, URETEROSCOPY AND STENT PLACEMENT Left 02/23/2020   Procedure: CYSTOSCOPY WITH RETROGRADE PYELOGRAM, URETEROSCOPY, LITHOTRIPSY, STONE EXTRACTION,  AND STENT PLACEMENT;  Surgeon: Matilda Senior, MD;  Location: [facility];  Service: Urology;  Laterality: Left;   CYSTOSCOPY/RETROGRADE/URETEROSCOPY/STONE EXTRACTION WITH BASKET Bilateral 10/10/2013   Procedure: CYSTOSCOPY/URETEROSCOPY/STONE EXTRACTION WITH HOLMIUM LASER, bilateral retrograde, bilateral ureteral stents;  Surgeon: Senior CHRISTELLA Matilda, MD;  Location: [facility];  Service: Urology;  Laterality: Bilateral;   EXPLORATORY LAPAROTOMY  1970   EXPLORATORY STOMACH SURGERY FOR TEAR IN STOMACH WALL    EXTRACORPOREAL SHOCK WAVE LITHOTRIPSY Left 10/06/2019   Procedure: EXTRACORPOREAL SHOCK WAVE LITHOTRIPSY (ESWL);  Surgeon: Elisabeth Valli BIRCH, MD;  Location: [facility];  Service: Urology;  Laterality: Left;   EXTRACORPOREAL SHOCK WAVE LITHOTRIPSY  multiple since 2002,  last one 02-28-2013   HOLMIUM LASER APPLICATION Left 10/10/2013   Procedure: HOLMIUM LASER APPLICATION;  Surgeon: Senior CHRISTELLA Matilda, MD;  Location: [facility];  Service: Urology;  Laterality: Left;   KNEE ARTHROSCOPY  02/05/2011   Procedure: ARTHROSCOPY KNEE;  Surgeon: Lynwood SQUIBB [place];  Location: [facility];  Service: Orthopedics;  Laterality: Right;  Right knee arthroscopy partial lateral and medial meniscectomy with condyle debridement   LUMBAR SPINE SURGERY  x2  1995   ORIF ANKLE FRACTURE Right 1988   ROTATOR CUFF REPAIR Bilateral right 02/16/2018;  left 1997   TONSILLECTOMY AND ADENOIDECTOMY  1951   TOTAL KNEE ARTHROPLASTY Left 04/08/2019   Procedure: TOTAL KNEE ARTHROPLASTY;  Surgeon: Gerome Charleston, MD;  Location: [facility];  Service: Orthopedics;  Laterality: Left;  with adductor canal   Patient Active Problem List   Diagnosis Date Noted   Cellulitis 08/17/2023   Asthma exacerbation 04/22/2021   History of total knee arthroplasty 04/08/2019   DOE (dyspnea on exertion) 11/09/2018   Cough variant asthma vs UACS  06/27/2016    PCP: Kip Righter, MD  REFERRING PROVIDER: Melita Drivers, MD  REFERRING DIAG:  Diagnosis  M25.511 (ICD-10-CM) - Pain in right  shoulder  S/p Rt reverse total shoulder replacement   THERAPY DIAG:  Acute pain of right shoulder  Stiffness of right shoulder, not elsewhere classified  Muscle weakness (generalized)  Abnormal posture  Rationale for Evaluation and Treatment: Rehabilitation  ONSET DATE: surgery 08/11/23  SUBJECTIVE:                                                                                                                                                                                       SUBJECTIVE STATEMENT: Decreased pain since last visit in the neck and shoulder  From eval: Pt presents to PT s/p Rt reverse total shoulder replacement performed 08/11/23.  [place] and had resultant torn rotator cuff and this lead to surgery. He is wearing sling just when out of the house.    Hand dominance: Right  PERTINENT HISTORY: Lumbar surgery x 2 in 1995, ORIF Rt ankle, TKA Lt 2021, fall in airport 06/2023 with concussion, rib fracture and Rt shoulder injury, Rt reverse TSA 08/11/23  PAIN:09/23/23 Are you having pain? Yes: NPRS scale: 0/10 at rest, 7/10 during movement Pain location: Rt shoulder  Pain description: stabbing, aching Aggravating factors: moving Rt UE Relieving factors: pain meds, rest, ice  PRECAUTIONS: Other: follow post-op protocol  RED FLAGS: None   WEIGHT BEARING RESTRICTIONS: No  FALLS:  Has patient fallen in last 6 months? Yes. Number of falls prior to surgery- torn rotator cuff and labrum  LIVING ENVIRONMENT: Lives with: lives with their spouse Lives in: House/apartment Stairs: No Has following equipment at home: Single point cane    PLOF: Independent and Leisure: travel  PATIENT GOALS: return to prior level of function, use Rt UE   NEXT MD VISIT: 10/07/23  OBJECTIVE:  Note: Objective measures were completed at Evaluation unless otherwise noted.  DIAGNOSTIC FINDINGS:  NA  PATIENT SURVEYS :  Quick Dash Disability/Symptom Score:= 79.5% disability   Minimally Clinically Important Difference (MCID): 15-20 points    COGNITION: Overall cognitive status: Within functional limits for tasks assessed     SENSATION: WFL  POSTURE: Forward head/rounded shoulders   UPPER EXTREMITY ROM:   Passive ROM Right eval Left eval Right  09/30/23  Shoulder flexion 60 WFLs  105  Shoulder extension     Shoulder abduction 50  65  Shoulder adduction     Shoulder internal rotation 30    Shoulder  external rotation 15  30  Elbow flexion     Elbow extension     Wrist flexion     Wrist extension     Wrist ulnar deviation     Wrist radial deviation  Wrist pronation     Wrist supination     (Blank rows = not tested)  UPPER EXTREMITY MMT: Not tested due to post-op restrictions  JOINT MOBILITY TESTING:  NA- post op  PALPATION:  Healing surgical incision over Rt anterior aspect of shoulder.  Reduced mobility and tenderness                                                                                                                              TREATMENT DATE:  09/30/23 Pulleys flexion and scaption x 3 min ea with noodle behind back horizontally Wall ladder x 3 challenging and a little painful Standing with noodle along spine: scap retraction ---> ER x 20 B Standing shoulder ABD AA with cane x 5 painful in ant shoulder Seated OH flexion with hands clasped x 20 no pain Isometrics ER into wall 5 x 10 sec hold - pt cued to not push as he was causing marked pain anteriorly Atttempted ER isometric walkout with yellow but too difficult Supine chest press with dowel x 10, OH flex x 10 Manual:STM and IASTM to R  biceps, PROM R shoulder (towel rolls supporting upper arm)    09/28/23 Pulleys flexion x 2 min  Seated mobilization with pillowcase for relaxation Standing with noodle along spine: scap retraction ---> ER x 20 B UE Ranger: at steps: flexion, ER and circles  Seated OH flexion with hands clasped x 15; also done after manual therapy Seated ER with cane- x 5 Manual:STM and IASTM to R UT, pecs, biceps, use of ball to pectorals for STM/HEP; scar massage Supine: elbow extension stretch (towel rolls supporting upper arm)  09/23/23 UE Ranger: at steps: flexion and circles 2x5 each Isometrics: Rt abduction and ER 5 hold 2x5 Overhead pulleys: flexion x 3 minutes Seated ER with cane- tactile cues for alignment and motion x10 Manual: P/ROM into flexion, ER and abduction  within protocol limits, isometrics with manual resistance ER and abduction  Supine: clasped hands overhead, ER and abduction x10    09/21/23 UE Ranger: at steps: flexion and circles 2x5 each Overhead pulleys: flexion x 3 minutes Supine: clasped hands overhead, ER and abduction x10  Seated shoulder circles and shrugs Isometrics: abduction and ER 5 hold 2x5 Rt upper trap stretch 2x20 seconds  Table slides: Rt 2x5 with 5 hold  Manual: P/ROM into flexion, ER and abduction within protocol limits, isometrics with manual resistance ER and abduction    09/16/23 UE Ranger: seated: flexion and circles 2x5 each Overhead pulleys: flexion x 3 minutes Supine: clasped hands overhead, ER and abduction x10  Seated shoulder circles and shrugs Manual: P/ROM into flexion, ER and abduction within protocol limits, isometrics with manual resistance ER and abduction    PATIENT EDUCATION: Education details: [email or web address], over the door pulleys, scar mobs on Rt   Person educated: Patient Education method: Explanation, Demonstration, and Handouts Education comprehension: verbalized understanding and returned demonstration  HOME EXERCISE PROGRAM: Access Code:  [email or web address] URL: [email or web address] Date: 09/14/2023 Prepared by: Burnard  Exercises - Seated Shoulder Shrug Circles AROM Backward  - 5 x daily - 7 x weekly - 1 sets - 10 reps - Seated Shoulder Flexion Towel Slide at Table Top  - 2-3 x daily - 7 x weekly - 1 sets - 10 reps - Seated Elbow Flexion and Extension AROM  - 3 x daily - 7 x weekly - 1 sets - 10 reps - Supine Shoulder Flexion AAROM with Hands Clasped  - 1 x daily - 7 x weekly - 3 sets - 10 reps - Supine Shoulder External Rotation with Dowel  - 2 x daily - 7 x weekly - 1 sets - 10 reps - 10 hold  ASSESSMENT:  CLINICAL IMPRESSION:  Patient is progressing with A/AROM, but is somewhat limited by pain. He has marked pain in the R biceps which improves with STM. His neck is much better  and he is having less incidences of sharp anterior shoulder pain. Patient may be overdoing it with his exercises. Advised him that he should not have pain with isometrics beyond mild discomfort, nor should her push his A/A exercises beyond this level.  He demonstrates significant forward head and rounded shoulders and we have been working on scapular retraction to improve his posture. He continues to demonstrate potential for improvement and would benefit from continued skilled therapy to address impairments.      OBJECTIVE IMPAIRMENTS: decreased ROM, decreased strength, increased edema, increased fascial restrictions, impaired perceived functional ability, increased muscle spasms, impaired flexibility, impaired UE functional use, postural dysfunction, and pain.   ACTIVITY LIMITATIONS: carrying, lifting, dressing, self feeding, reach over head, and hygiene/grooming  PARTICIPATION LIMITATIONS: meal prep, cleaning, laundry, driving, community activity, and yard work  PERSONAL FACTORS: Age and 1 comorbidity: s/p Rt TSA are also affecting patient's functional outcome.   REHAB POTENTIAL: Good  CLINICAL DECISION MAKING: Stable/uncomplicated  EVALUATION COMPLEXITY: Low  GOALS: Goals reviewed with patient? Yes  SHORT TERM GOALS: Target date: 10/12/2023    Be independent in initial HEP Baseline: has initial HEP (09/23/23) Goal status: IN PROGRESS  2.  Demonstrate Rt shoulder P/ROM flexion to > or = to 100 degrees to promote overhead reaching  Baseline:  Goal status: INITIAL  3.  Improve QuickDASH to < or = to 68% disability  Baseline: 79%  Goal status: INITIAL  4.  Wean from sling and report > or = to 30% use of Rt UE with ADLs and dressing  Baseline:  Goal status: INITIAL  5.  Demonstrate Rt shoulder P/ROM ER to > or = to 45 degrees to improve reaching behind head  Baseline: 15 Goal status: INITIAL    LONG TERM GOALS: Target date: 11/09/2023    Be independent in advanced  HEP Baseline:  Goal status: INITIAL  2.  Improve QuickDASH to < or = to 50% disability for use with functional tasks  Baseline: 79%  Goal status: INITIAL  3.  Demonstrate Rt shoulder A/ROM flexion to > or = to 110 degrees for reaching overhead Baseline:  Goal status: INITIAL  4.  Report > or = to 60% use of Rt UE with ADLs and self-care due to improve ROM and functional strength  Baseline:  Goal status: INITIAL  5.  Demonstrate Rt UE A/ROM ER to occiput to aid with self-care Baseline:  Goal status: INITIAL    PLAN: PT FREQUENCY: 2x/week  PT DURATION: 8 weeks  PLANNED INTERVENTIONS: 97110-Therapeutic exercises, 97530- Therapeutic activity, [id number]- Neuromuscular  re-education, [phone]- Self Care, [id number]- Manual therapy, [phone]- Canalith repositioning, [id number]- Aquatic Therapy, [phone]- Electrical stimulation (unattended), [phone] (1-2 muscles), [phone] (3+ muscles)- Dry Needling, Patient/Family education, Taping, Joint mobilization, Scar mobilization, Cryotherapy, and Moist heat  PLAN FOR NEXT SESSION:  What did MD say, progress per protocol, continue with STM prn. review HEP, scar mobs, P/ROM.    Mliss Cummins, PT

## 2023-10-05 ENCOUNTER — Encounter: Payer: Self-pay | Admitting: Physical Therapy

## 2023-10-05 ENCOUNTER — Ambulatory Visit: Admitting: Physical Therapy

## 2023-10-05 DIAGNOSIS — M25611 Stiffness of right shoulder, not elsewhere classified: Secondary | ICD-10-CM | POA: Diagnosis not present

## 2023-10-05 DIAGNOSIS — R293 Abnormal posture: Secondary | ICD-10-CM

## 2023-10-05 DIAGNOSIS — M25511 Pain in right shoulder: Secondary | ICD-10-CM

## 2023-10-05 DIAGNOSIS — M6281 Muscle weakness (generalized): Secondary | ICD-10-CM | POA: Diagnosis not present

## 2023-10-05 NOTE — Therapy (Signed)
 OUTPATIENT PHYSICAL THERAPY TREATMENT NOTE   Patient Name: Andrew Moran MRN: [id number] DOB:[birth year], 78 y.o., male Today's Date: 10/05/2023  END OF SESSION:  PT End of Session - 10/05/23 1537     Visit Number 7    Date for PT Re-Evaluation 11/09/23    Authorization Type [employer] Medicare-16 vl approved : 09/14/2023 - 11/09/2023    Authorization - Visit Number 7    Authorization - Number of Visits 16    Progress Note Due on Visit 10    PT Start Time 1535    PT Stop Time 1618    PT Time Calculation (min) 43 min    Activity Tolerance Patient tolerated treatment well    Behavior During Therapy [facility] for tasks assessed/performed               Past Medical History:  Diagnosis Date   Arthritis    Asthma    pulmology-- dr wert--- cough varient versus uacs   GERD (gastroesophageal reflux disease)    per pt takes DGL supplement   History of diverticulitis of colon 2002   managed medically , no surgical intervention   History of DVT of lower extremity 06/27/2019   followed by dr g. loni--  completed 3 months xarelto  07/ 2021   History of kidney stones    Renal calculi    left side   Past Surgical History:  Procedure Laterality Date   ANKLE HARDWARE REMOVAL Right 2008   CARPAL TUNNEL RELEASE Bilateral left 1983;  right 1984   CYSTOSCOPY WITH RETROGRADE PYELOGRAM, URETEROSCOPY AND STENT PLACEMENT Left 02/23/2020   Procedure: CYSTOSCOPY WITH RETROGRADE PYELOGRAM, URETEROSCOPY, LITHOTRIPSY, STONE EXTRACTION,  AND STENT PLACEMENT;  Surgeon: Matilda Senior, MD;  Location: [facility];  Service: Urology;  Laterality: Left;   CYSTOSCOPY/RETROGRADE/URETEROSCOPY/STONE EXTRACTION WITH BASKET Bilateral 10/10/2013   Procedure: CYSTOSCOPY/URETEROSCOPY/STONE EXTRACTION WITH HOLMIUM LASER, bilateral retrograde, bilateral ureteral stents;  Surgeon: Senior CHRISTELLA Matilda, MD;  Location: [facility];  Service: Urology;  Laterality: Bilateral;   EXPLORATORY LAPAROTOMY  1970    EXPLORATORY STOMACH SURGERY FOR TEAR IN STOMACH WALL   EXTRACORPOREAL SHOCK WAVE LITHOTRIPSY Left 10/06/2019   Procedure: EXTRACORPOREAL SHOCK WAVE LITHOTRIPSY (ESWL);  Surgeon: Elisabeth Valli BIRCH, MD;  [facility]  Service: Urology;  Laterality: Left;   EXTRACORPOREAL SHOCK WAVE LITHOTRIPSY  multiple since 2002,  last one 02-28-2013   HOLMIUM LASER APPLICATION Left 10/10/2013   Procedure: HOLMIUM LASER APPLICATION;  Surgeon: Senior CHRISTELLA Matilda, MD;  Location: [facility];  Service: Urology;  Laterality: Left;   KNEE ARTHROSCOPY  02/05/2011   Procedure: ARTHROSCOPY KNEE;  Surgeon: Lynwood SQUIBB [place];  Location: [facility];  Service: Orthopedics;  Laterality: Right;  Right knee arthroscopy partial lateral and medial meniscectomy with condyle debridement   LUMBAR SPINE SURGERY  x2  1995   ORIF ANKLE FRACTURE Right 1988   ROTATOR CUFF REPAIR Bilateral right 02/16/2018;  left 1997   TONSILLECTOMY AND ADENOIDECTOMY  1951   TOTAL KNEE ARTHROPLASTY Left 04/08/2019   Procedure: TOTAL KNEE ARTHROPLASTY;  Surgeon: Gerome Charleston, MD;  Location: [facility];  Service: Orthopedics;  Laterality: Left;  with adductor canal   Patient Active Problem List   Diagnosis Date Noted   Cellulitis 08/17/2023   Asthma exacerbation 04/22/2021   History of total knee arthroplasty 04/08/2019   DOE (dyspnea on exertion) 11/09/2018   Cough variant asthma vs UACS  06/27/2016    PCP: Kip Righter, MD  REFERRING PROVIDER: Melita Drivers, MD  REFERRING  DIAG:  Diagnosis  M25.511 (ICD-10-CM) - Pain in right shoulder  S/p Rt reverse total shoulder replacement   THERAPY DIAG:  Acute pain of right shoulder  Stiffness of right shoulder, not elsewhere classified  Muscle weakness (generalized)  Abnormal posture  Rationale for Evaluation and Treatment: Rehabilitation  ONSET DATE: surgery 08/11/23  SUBJECTIVE:                                                                                                                                                                                       SUBJECTIVE STATEMENT: Saw MD. He's happy with where I am.   From eval: Pt presents to PT s/p Rt reverse total shoulder replacement performed 08/11/23.  [place] and had resultant torn rotator cuff and this lead to surgery. He is wearing sling just when out of the house.    Hand dominance: Right  PERTINENT HISTORY: Lumbar surgery x 2 in 1995, ORIF Rt ankle, TKA Lt 2021, fall in airport 06/2023 with concussion, rib fracture and Rt shoulder injury, Rt reverse TSA 08/11/23  PAIN:09/23/23 Are you having pain? Yes: NPRS scale: 0/10 at rest, 7/10 during movement Pain location: Rt shoulder  Pain description: stabbing, aching Aggravating factors: moving Rt UE Relieving factors: pain meds, rest, ice  PRECAUTIONS: Other: follow post-op protocol  RED FLAGS: None   WEIGHT BEARING RESTRICTIONS: No  FALLS:  Has patient fallen in last 6 months? Yes. Number of falls prior to surgery- torn rotator cuff and labrum  LIVING ENVIRONMENT: Lives with: lives with their spouse Lives in: House/apartment Stairs: No Has following equipment at home: Single point cane    PLOF: Independent and Leisure: travel  PATIENT GOALS: return to prior level of function, use Rt UE   NEXT MD VISIT: 10/07/23  OBJECTIVE:  Note: Objective measures were completed at Evaluation unless otherwise noted.  DIAGNOSTIC FINDINGS:  NA  PATIENT SURVEYS :  Quick Dash Disability/Symptom Score:= 79.5% disability   Minimally Clinically Important Difference (MCID): 15-20 points    COGNITION: Overall cognitive status: Within functional limits for tasks assessed     SENSATION: WFL  POSTURE: Forward head/rounded shoulders   UPPER EXTREMITY ROM:   Passive ROM Right eval Left eval Right  09/30/23  Shoulder flexion 60 WFLs  105  Shoulder extension     Shoulder abduction 50  65  Shoulder adduction     Shoulder  internal rotation 30    Shoulder external rotation 15  30  Elbow flexion     Elbow extension     Wrist flexion     Wrist extension     Wrist ulnar deviation  Wrist radial deviation     Wrist pronation     Wrist supination     (Blank rows = not tested)  UPPER EXTREMITY MMT: Not tested due to post-op restrictions  JOINT MOBILITY TESTING:  NA- post op  PALPATION:  Healing surgical incision over Rt anterior aspect of shoulder.  Reduced mobility and tenderness                                                                                                                              TREATMENT DATE:  10/05/23 Pulleys flexion and scaption x 3 min ea with noodle behind back horizontally Standing with noodle along spine: scap retraction ---> ER x 20 B Seated OH flexion with hands clasped x 20 no pain Isometrics ABD and ER into wall 5 x 10 sec hold  Supine  OH flex with clasped hands x 15 post manual Manual:scapular mobs in supine to relax shoulder; STM to R infraspinatus, PROM R shoulder (towel rolls supporting elbow) flex, ER and ABD ROM today: 115 deg flexion 28 deg ER   09/30/23 Pulleys flexion and scaption x 3 min ea with noodle behind back horizontally Wall ladder x 3 challenging and a little painful Standing with noodle along spine: scap retraction ---> ER x 20 B Standing shoulder ABD AA with cane x 5 painful in ant shoulder Seated OH flexion with hands clasped x 20 no pain Isometrics ER into wall 5 x 10 sec hold - pt cued to not push as he was causing marked pain anteriorly Atttempted ER isometric walkout with yellow but too difficult Supine chest press with dowel x 10, OH flex x 10 Manual:STM and IASTM to R  biceps, PROM R shoulder (towel rolls supporting upper arm)  09/28/23 Pulleys flexion x 2 min  Seated mobilization with pillowcase for relaxation Standing with noodle along spine: scap retraction ---> ER x 20 B UE Ranger: at steps: flexion, ER and circles  Seated  OH flexion with hands clasped x 15; also done after manual therapy Seated ER with cane- x 5 Manual:STM and IASTM to R UT, pecs, biceps, use of ball to pectorals for STM/HEP; scar massage Supine: elbow extension stretch (towel rolls supporting upper arm)  09/23/23 UE Ranger: at steps: flexion and circles 2x5 each Isometrics: Rt abduction and ER 5 hold 2x5 Overhead pulleys: flexion x 3 minutes Seated ER with cane- tactile cues for alignment and motion x10 Manual: P/ROM into flexion, ER and abduction within protocol limits, isometrics with manual resistance ER and abduction  Supine: clasped hands overhead, ER and abduction x10     PATIENT EDUCATION: Education details: [email or web address], over the door pulleys, scar mobs on Rt   Person educated: Patient Education method: Explanation, Demonstration, and Handouts Education comprehension: verbalized understanding and returned demonstration  HOME EXERCISE PROGRAM: Access Code: [email or web address] URL: [email or web address] Date: 09/14/2023 Prepared by: Burnard  Exercises - Seated Shoulder Shrug Circles AROM Backward  - 5 x daily - 7  x weekly - 1 sets - 10 reps - Seated Shoulder Flexion Towel Slide at Table Top  - 2-3 x daily - 7 x weekly - 1 sets - 10 reps - Seated Elbow Flexion and Extension AROM  - 3 x daily - 7 x weekly - 1 sets - 10 reps - Supine Shoulder Flexion AAROM with Hands Clasped  - 1 x daily - 7 x weekly - 3 sets - 10 reps - Supine Shoulder External Rotation with Dowel  - 2 x daily - 7 x weekly - 1 sets - 10 reps - 10 hold  ASSESSMENT:  CLINICAL IMPRESSION:  Patient saw MD and is pleased with his progress at this time. Patient having less pain since easing back on intensity of isometrics and ROM. Flexion has improved 10 degrees since last visit. He should be able to progress to sidelying AROM exercises at next visit if tolerated. He is 8 weeks on 10/06/23.     OBJECTIVE IMPAIRMENTS: decreased ROM, decreased strength, increased  edema, increased fascial restrictions, impaired perceived functional ability, increased muscle spasms, impaired flexibility, impaired UE functional use, postural dysfunction, and pain.   ACTIVITY LIMITATIONS: carrying, lifting, dressing, self feeding, reach over head, and hygiene/grooming  PARTICIPATION LIMITATIONS: meal prep, cleaning, laundry, driving, community activity, and yard work  PERSONAL FACTORS: Age and 1 comorbidity: s/p Rt TSA are also affecting patient's functional outcome.   REHAB POTENTIAL: Good  CLINICAL DECISION MAKING: Stable/uncomplicated  EVALUATION COMPLEXITY: Low  GOALS: Goals reviewed with patient? Yes  SHORT TERM GOALS: Target date: 10/12/2023    Be independent in initial HEP Baseline: has initial HEP (09/23/23) Goal status: IN PROGRESS  2.  Demonstrate Rt shoulder P/ROM flexion to > or = to 100 degrees to promote overhead reaching  Baseline:  Goal status: MET   3.  Improve QuickDASH to < or = to 68% disability  Baseline: 79%  Goal status: INITIAL  4.  Wean from sling and report > or = to 30% use of Rt UE with ADLs and dressing  Baseline:  Goal status: INITIAL  5.  Demonstrate Rt shoulder P/ROM ER to > or = to 45 degrees to improve reaching behind head  Baseline: 15 Goal status: INITIAL    LONG TERM GOALS: Target date: 11/09/2023    Be independent in advanced HEP Baseline:  Goal status: INITIAL  2.  Improve QuickDASH to < or = to 50% disability for use with functional tasks  Baseline: 79%  Goal status: INITIAL  3.  Demonstrate Rt shoulder A/ROM flexion to > or = to 110 degrees for reaching overhead Baseline:  Goal status: INITIAL  4.  Report > or = to 60% use of Rt UE with ADLs and self-care due to improve ROM and functional strength  Baseline:  Goal status: INITIAL  5.  Demonstrate Rt UE A/ROM ER to occiput to aid with self-care Baseline:  Goal status: INITIAL    PLAN: PT FREQUENCY: 2x/week  PT DURATION: 8 weeks  PLANNED  INTERVENTIONS: 97110-Therapeutic exercises, 97530- Therapeutic activity, [id number]- Neuromuscular re-education, 97535- Self Care, [id number]- Manual therapy, [phone]- Canalith repositioning, [id number]- Aquatic Therapy, [id number]- Electrical stimulation (unattended), [id number] (1-2 muscles), [id number] (3+ muscles)- Dry Needling, Patient/Family education, Taping, Joint mobilization, Scar mobilization, Cryotherapy, and Moist heat  PLAN FOR NEXT SESSION:  progress per protocol, continue with STM prn. Work on AROM as tolerated.   Mliss Cummins, PT

## 2023-10-06 ENCOUNTER — Other Ambulatory Visit (HOSPITAL_COMMUNITY): Payer: Self-pay

## 2023-10-06 NOTE — Therapy (Signed)
 OUTPATIENT PHYSICAL THERAPY TREATMENT NOTE   Patient Name: Andrew Moran MRN: [id number] DOB:March 08, 1946, 78 y.o., male Today's Date: 10/07/2023  END OF SESSION:  PT End of Session - 10/07/23 1357     Visit Number 8    Authorization Type [employer] [id number] vl approved : 09/14/2023 - 11/09/2023    Authorization - Visit Number 8    Authorization - Number of Visits 16    Progress Note Due on Visit 10    PT Start Time 1358    PT Stop Time 1442    PT Time Calculation (min) 44 min    Activity Tolerance Patient tolerated treatment well    Behavior During Therapy [facility] for tasks assessed/performed                Past Medical History:  Diagnosis Date   Arthritis    Asthma    pulmology-- dr wert--- cough varient versus uacs   GERD (gastroesophageal reflux disease)    per pt takes DGL supplement   History of diverticulitis of colon 2002   managed medically , no surgical intervention   History of DVT of lower extremity 06/27/2019   followed by dr g. loni--  completed 3 months xarelto  07/ 2021   History of kidney stones    Renal calculi    left side   Past Surgical History:  Procedure Laterality Date   ANKLE HARDWARE REMOVAL Right 2008   CARPAL TUNNEL RELEASE Bilateral left 1983;  right 1984   CYSTOSCOPY WITH RETROGRADE PYELOGRAM, URETEROSCOPY AND STENT PLACEMENT Left 02/23/2020   Procedure: CYSTOSCOPY WITH RETROGRADE PYELOGRAM, URETEROSCOPY, LITHOTRIPSY, STONE EXTRACTION,  AND STENT PLACEMENT;  Surgeon: Matilda Senior, MD;  Location: [facility];  Service: Urology;  Laterality: Left;   CYSTOSCOPY/RETROGRADE/URETEROSCOPY/STONE EXTRACTION WITH BASKET Bilateral 10/10/2013   Procedure: CYSTOSCOPY/URETEROSCOPY/STONE EXTRACTION WITH HOLMIUM LASER, bilateral retrograde, bilateral ureteral stents;  Surgeon: Senior CHRISTELLA Matilda, MD;  Location: [facility];  Service: Urology;  Laterality: Bilateral;   EXPLORATORY LAPAROTOMY  1970   EXPLORATORY STOMACH SURGERY FOR TEAR IN  STOMACH WALL   EXTRACORPOREAL SHOCK WAVE LITHOTRIPSY Left 10/06/2019   Procedure: EXTRACORPOREAL SHOCK WAVE LITHOTRIPSY (ESWL);  Surgeon: Elisabeth Valli BIRCH, MD;  Location: [facility];  Service: Urology;  Laterality: Left;   EXTRACORPOREAL SHOCK WAVE LITHOTRIPSY  multiple since 2002,  last one 02-28-2013   HOLMIUM LASER APPLICATION Left 10/10/2013   Procedure: HOLMIUM LASER APPLICATION;  Surgeon: Senior CHRISTELLA Matilda, MD;  Location: [facility];  Service: Urology;  Laterality: Left;   KNEE ARTHROSCOPY  02/05/2011   Procedure: ARTHROSCOPY KNEE;  Surgeon: Lynwood SQUIBB [place];  Location: [facility];  Service: Orthopedics;  Laterality: Right;  Right knee arthroscopy partial lateral and medial meniscectomy with condyle debridement   LUMBAR SPINE SURGERY  x2  1995   ORIF ANKLE FRACTURE Right 1988   ROTATOR CUFF REPAIR Bilateral right 02/16/2018;  left 1997   TONSILLECTOMY AND ADENOIDECTOMY  1951   TOTAL KNEE ARTHROPLASTY Left 04/08/2019   Procedure: TOTAL KNEE ARTHROPLASTY;  Surgeon: Gerome Charleston, MD;  Location: [facility];  Service: Orthopedics;  Laterality: Left;  with adductor canal   Patient Active Problem List   Diagnosis Date Noted   Cellulitis 08/17/2023   Asthma exacerbation 04/22/2021   History of total knee arthroplasty 04/08/2019   DOE (dyspnea on exertion) 11/09/2018   Cough variant asthma vs UACS  06/27/2016    PCP: Kip Righter, MD  REFERRING PROVIDER: Melita Drivers, MD  REFERRING DIAG:  Diagnosis  M25.511 (ICD-10-CM) -  Pain in right shoulder  S/p Rt reverse total shoulder replacement   THERAPY DIAG:  Acute pain of right shoulder  Stiffness of right shoulder, not elsewhere classified  Muscle weakness (generalized)  Abnormal posture  Rationale for Evaluation and Treatment: Rehabilitation  ONSET DATE: surgery 08/11/23  SUBJECTIVE:                                                                                                                                                                                       SUBJECTIVE STATEMENT: Still no pain at rest.  8 weeks post op on 10/06/23  From eval: Pt presents to PT s/p Rt reverse total shoulder replacement performed 08/11/23.  [place] and had resultant torn rotator cuff and this lead to surgery. He is wearing sling just when out of the house.    Hand dominance: Right  PERTINENT HISTORY: Lumbar surgery x 2 in 1995, ORIF Rt ankle, TKA Lt 2021, fall in airport 06/2023 with concussion, rib fracture and Rt shoulder injury, Rt reverse TSA 08/11/23  PAIN:09/23/23 Are you having pain? Yes: NPRS scale: 0/10 at rest, 7/10 during movement Pain location: Rt shoulder  Pain description: stabbing, aching Aggravating factors: moving Rt UE Relieving factors: pain meds, rest, ice  PRECAUTIONS: Other: follow post-op protocol  RED FLAGS: None   WEIGHT BEARING RESTRICTIONS: No  FALLS:  Has patient fallen in last 6 months? Yes. Number of falls prior to surgery- torn rotator cuff and labrum  LIVING ENVIRONMENT: Lives with: lives with their spouse Lives in: House/apartment Stairs: No Has following equipment at home: Single point cane    PLOF: Independent and Leisure: travel  PATIENT GOALS: return to prior level of function, use Rt UE   NEXT MD VISIT: 10/07/23  OBJECTIVE:  Note: Objective measures were completed at Evaluation unless otherwise noted.  DIAGNOSTIC FINDINGS:  NA  PATIENT SURVEYS :  Quick Dash Disability/Symptom Score:= 79.5% disability   Minimally Clinically Important Difference (MCID): 15-20 points    COGNITION: Overall cognitive status: Within functional limits for tasks assessed     SENSATION: WFL  POSTURE: Forward head/rounded shoulders   UPPER EXTREMITY ROM:   Passive ROM Right eval Left eval Right  09/30/23  Shoulder flexion 60 WFLs  105  Shoulder extension     Shoulder abduction 50  65  Shoulder adduction     Shoulder internal rotation 30     Shoulder external rotation 15  30  Elbow flexion     Elbow extension     Wrist flexion     Wrist extension     Wrist ulnar deviation     Wrist  radial deviation     Wrist pronation     Wrist supination     (Blank rows = not tested)  UPPER EXTREMITY MMT: Not tested due to post-op restrictions  JOINT MOBILITY TESTING:  NA- post op  PALPATION:  Healing surgical incision over Rt anterior aspect of shoulder.  Reduced mobility and tenderness                                                                                                                              TREATMENT DATE:  10/07/23 Pulleys flexion and scaption x 3 min Standing with noodle along spine: scap retraction ---> ER x 10 B Seated OH flexion with hands clasped x 15 neck  Worked on scapular depression in standing and in bent over position S/L flex x 10 some pain in first part of arc S/L ER 2x10 feels in elbow beyond 90 deg Supine OH flexion with clasped hands x 5 large towel roll under elbow Supine OH flexion with small noodle x 20 large towel roll under elbow Supine Isometrics  ER with red loop around wrists 10 x 5-10 sec hold painfree range Supine shoulder depression x 10 Supine biceps curls 3# x 20   10/05/23 Pulleys flexion and scaption x 3 min ea with noodle behind back horizontally Standing with noodle along spine: scap retraction ---> ER x 20 B Seated OH flexion with hands clasped x 20 no pain Isometrics ABD and ER into wall 5 x 10 sec hold  Supine  OH flex with clasped hands x 15 post manual Manual:scapular mobs in supine to relax shoulder; STM to R infraspinatus, PROM R shoulder (towel rolls supporting elbow) flex, ER and ABD ROM today: 115 deg flexion 28 deg ER   09/30/23 Pulleys flexion and scaption x 3 min ea with noodle behind back horizontally Wall ladder x 3 challenging and a little painful Standing with noodle along spine: scap retraction ---> ER x 20 B Standing shoulder ABD AA with cane x 5  painful in ant shoulder Seated OH flexion with hands clasped x 20 no pain Isometrics ER into wall 5 x 10 sec hold - pt cued to not push as he was causing marked pain anteriorly Atttempted ER isometric walkout with yellow but too difficult Supine chest press with dowel x 10, OH flex x 10 Manual:STM and IASTM to R  biceps, PROM R shoulder (towel rolls supporting upper arm)  09/28/23 Pulleys flexion x 2 min  Seated mobilization with pillowcase for relaxation Standing with noodle along spine: scap retraction ---> ER x 20 B UE Ranger: at steps: flexion, ER and circles  Seated OH flexion with hands clasped x 15; also done after manual therapy Seated ER with cane- x 5 Manual:STM and IASTM to R UT, pecs, biceps, use of ball to pectorals for STM/HEP; scar massage Supine: elbow extension stretch (towel rolls supporting upper arm)  09/23/23 UE Ranger: at steps: flexion and circles 2x5 each Isometrics:  Rt abduction and ER 5 hold 2x5 Overhead pulleys: flexion x 3 minutes Seated ER with cane- tactile cues for alignment and motion x10 Manual: P/ROM into flexion, ER and abduction within protocol limits, isometrics with manual resistance ER and abduction  Supine: clasped hands overhead, ER and abduction x10     PATIENT EDUCATION: Education details: [email or web address], over the door pulleys, scar mobs on Rt   Person educated: Patient Education method: Explanation, Demonstration, and Handouts Education comprehension: verbalized understanding and returned demonstration  HOME EXERCISE PROGRAM: Access Code: [email or web address] URL: [email or web address] Date: 10/07/2023 Prepared by: Mliss  Exercises - Seated Shoulder Shrug Circles AROM Backward  - 5 x daily - 7 x weekly - 1 sets - 10 reps - Seated Shoulder Flexion Towel Slide at Table Top  - 2-3 x daily - 7 x weekly - 1 sets - 10 reps - Seated Elbow Flexion and Extension AROM  - 3 x daily - 7 x weekly - 1 sets - 10 reps - Supine Shoulder Flexion AAROM  with Hands Clasped  - 1 x daily - 7 x weekly - 3 sets - 10 reps - Supine Shoulder External Rotation with Dowel  - 2 x daily - 7 x weekly - 1 sets - 10 reps - 10 hold - Isometric Shoulder Abduction at Wall  - 2 x daily - 7 x weekly - 2 sets - 5-10 reps - 5 hold - Standing Isometric Shoulder External Rotation with Doorway and Towel Roll  - 2 x daily - 7 x weekly - 2 sets - 5-10 reps - 5 hold - Sidelying Shoulder External Rotation AROM  - 1 x daily - 3 x weekly - 1-3 sets - 10 reps - Sidelying Shoulder Flexion 15 Degrees  - 1 x daily - 3-4 x weekly - 1-3 sets - 10 reps - 3 sec hold  ASSESSMENT:  CLINICAL IMPRESSION: Paitent able to progress strengthening today. He requires certain support and positioning to avoid sharp pain in anterior shoulder and some exercises are done in smaller ranges for now. We worked on scapular depression and retraction to help decrease UT tightness when needed. HEP updated. He reports that he felt like he really worked it today.  Pt  8 weeks post op on 10/06/23.     OBJECTIVE IMPAIRMENTS: decreased ROM, decreased strength, increased edema, increased fascial restrictions, impaired perceived functional ability, increased muscle spasms, impaired flexibility, impaired UE functional use, postural dysfunction, and pain.   ACTIVITY LIMITATIONS: carrying, lifting, dressing, self feeding, reach over head, and hygiene/grooming  PARTICIPATION LIMITATIONS: meal prep, cleaning, laundry, driving, community activity, and yard work  PERSONAL FACTORS: Age and 1 comorbidity: s/p Rt TSA are also affecting patient's functional outcome.   REHAB POTENTIAL: Good  CLINICAL DECISION MAKING: Stable/uncomplicated  EVALUATION COMPLEXITY: Low  GOALS: Goals reviewed with patient? Yes  SHORT TERM GOALS: Target date: 10/12/2023    Be independent in initial HEP Baseline: has initial HEP (09/23/23) Goal status: IN PROGRESS  2.  Demonstrate Rt shoulder P/ROM flexion to > or = to 100 degrees  to promote overhead reaching  Baseline:  Goal status: MET   3.  Improve QuickDASH to < or = to 68% disability  Baseline: 79%  Goal status: INITIAL  4.  Wean from sling and report > or = to 30% use of Rt UE with ADLs and dressing  Baseline:  Goal status: INITIAL  5.  Demonstrate Rt shoulder P/ROM ER to > or = to 45 degrees to improve reaching behind head  Baseline: 15 Goal status: INITIAL    LONG TERM GOALS: Target date: 11/09/2023    Be independent in advanced HEP Baseline:  Goal status: INITIAL  2.  Improve QuickDASH to < or = to 50% disability for use with functional tasks  Baseline: 79%  Goal status: INITIAL  3.  Demonstrate Rt shoulder A/ROM flexion to > or = to 110 degrees for reaching overhead Baseline:  Goal status: INITIAL  4.  Report > or = to 60% use of Rt UE with ADLs and self-care due to improve ROM and functional strength  Baseline:  Goal status: INITIAL  5.  Demonstrate Rt UE A/ROM ER to occiput to aid with self-care Baseline:  Goal status: INITIAL    PLAN: PT FREQUENCY: 2x/week  PT DURATION: 8 weeks  PLANNED INTERVENTIONS: 97110-Therapeutic exercises, 97530- Therapeutic activity, [id number]- Neuromuscular re-education, 97535- Self Care, [id number]- Manual therapy, [phone]- Canalith repositioning, [id number]- Aquatic Therapy, [id number]- Electrical stimulation (unattended), 20560 (1-2 muscles), 20561 (3+ muscles)- Dry Needling, Patient/Family education, Taping, Joint mobilization, Scar mobilization, Cryotherapy, and Moist heat  PLAN FOR NEXT SESSION:  progress per protocol, continue with STM prn. Work on AROM as tolerated.   Mliss Cummins, PT

## 2023-10-07 ENCOUNTER — Encounter: Payer: Self-pay | Admitting: Physical Therapy

## 2023-10-07 ENCOUNTER — Ambulatory Visit: Admitting: Physical Therapy

## 2023-10-07 DIAGNOSIS — M6281 Muscle weakness (generalized): Secondary | ICD-10-CM | POA: Diagnosis not present

## 2023-10-07 DIAGNOSIS — M25511 Pain in right shoulder: Secondary | ICD-10-CM | POA: Diagnosis not present

## 2023-10-07 DIAGNOSIS — R293 Abnormal posture: Secondary | ICD-10-CM

## 2023-10-07 DIAGNOSIS — M25611 Stiffness of right shoulder, not elsewhere classified: Secondary | ICD-10-CM | POA: Diagnosis not present

## 2023-10-12 ENCOUNTER — Encounter: Payer: Self-pay | Admitting: Physical Therapy

## 2023-10-12 ENCOUNTER — Ambulatory Visit: Admitting: Physical Therapy

## 2023-10-12 DIAGNOSIS — M6281 Muscle weakness (generalized): Secondary | ICD-10-CM

## 2023-10-12 DIAGNOSIS — R293 Abnormal posture: Secondary | ICD-10-CM | POA: Diagnosis not present

## 2023-10-12 DIAGNOSIS — M25611 Stiffness of right shoulder, not elsewhere classified: Secondary | ICD-10-CM

## 2023-10-12 DIAGNOSIS — M25511 Pain in right shoulder: Secondary | ICD-10-CM

## 2023-10-12 NOTE — Therapy (Signed)
 OUTPATIENT PHYSICAL THERAPY TREATMENT NOTE   Patient Name: NIVIN BRANIFF MRN: [id number] DOB:11-01-1945, 78 y.o., male Today's Date: 10/12/2023  END OF SESSION:  PT End of Session - 10/12/23 1539     Visit Number 9    Date for PT Re-Evaluation 11/09/23    Authorization Type [employer] Medicare-16 vl approved : 09/14/2023 - 11/09/2023    Authorization - Visit Number 9    Authorization - Number of Visits 16    Progress Note Due on Visit 10    PT Start Time 1533    PT Stop Time 1615    PT Time Calculation (min) 42 min    Activity Tolerance Patient tolerated treatment well    Behavior During Therapy [facility] for tasks assessed/performed                Past Medical History:  Diagnosis Date   Arthritis    Asthma    pulmology-- dr wert--- cough varient versus uacs   GERD (gastroesophageal reflux disease)    per pt takes DGL supplement   History of diverticulitis of colon 2002   managed medically , no surgical intervention   History of DVT of lower extremity 06/27/2019   followed by dr g. loni--  completed 3 months xarelto  07/ 2021   History of kidney stones    Renal calculi    left side   Past Surgical History:  Procedure Laterality Date   ANKLE HARDWARE REMOVAL Right 2008   CARPAL TUNNEL RELEASE Bilateral left 1983;  right 1984   CYSTOSCOPY WITH RETROGRADE PYELOGRAM, URETEROSCOPY AND STENT PLACEMENT Left 02/23/2020   Procedure: CYSTOSCOPY WITH RETROGRADE PYELOGRAM, URETEROSCOPY, LITHOTRIPSY, STONE EXTRACTION,  AND STENT PLACEMENT;  Surgeon: Matilda Senior, MD;  Location: [facility];  Service: Urology;  Laterality: Left;   CYSTOSCOPY/RETROGRADE/URETEROSCOPY/STONE EXTRACTION WITH BASKET Bilateral 10/10/2013   Procedure: CYSTOSCOPY/URETEROSCOPY/STONE EXTRACTION WITH HOLMIUM LASER, bilateral retrograde, bilateral ureteral stents;  Surgeon: Senior CHRISTELLA Matilda, MD;  Location: [facility];  Service: Urology;  Laterality: Bilateral;   EXPLORATORY LAPAROTOMY  1970    EXPLORATORY STOMACH SURGERY FOR TEAR IN STOMACH WALL   EXTRACORPOREAL SHOCK WAVE LITHOTRIPSY Left 10/06/2019   Procedure: EXTRACORPOREAL SHOCK WAVE LITHOTRIPSY (ESWL);  Surgeon: Elisabeth Valli BIRCH, MD;  Location: [facility];  Service: Urology;  Laterality: Left;   EXTRACORPOREAL SHOCK WAVE LITHOTRIPSY  multiple since 2002,  last one 02-28-2013   HOLMIUM LASER APPLICATION Left 10/10/2013   Procedure: HOLMIUM LASER APPLICATION;  Surgeon: Senior CHRISTELLA Matilda, MD;  Location: [facility];  Service: Urology;  Laterality: Left;   KNEE ARTHROSCOPY  02/05/2011   Procedure: ARTHROSCOPY KNEE;  Surgeon: Lynwood SQUIBB [place];  Location: [facility];  Service: Orthopedics;  Laterality: Right;  Right knee arthroscopy partial lateral and medial meniscectomy with condyle debridement   LUMBAR SPINE SURGERY  x2  1995   ORIF ANKLE FRACTURE Right 1988   ROTATOR CUFF REPAIR Bilateral right 02/16/2018;  left 1997   TONSILLECTOMY AND ADENOIDECTOMY  1951   TOTAL KNEE ARTHROPLASTY Left 04/08/2019   Procedure: TOTAL KNEE ARTHROPLASTY;  Surgeon: Gerome Charleston, MD;  Location: [facility];  Service: Orthopedics;  Laterality: Left;  with adductor canal   Patient Active Problem List   Diagnosis Date Noted   Cellulitis 08/17/2023   Asthma exacerbation 04/22/2021   History of total knee arthroplasty 04/08/2019   DOE (dyspnea on exertion) 11/09/2018   Cough variant asthma vs UACS  06/27/2016    PCP: Kip Righter, MD  REFERRING PROVIDER: Melita Drivers, MD  REFERRING DIAG:  Diagnosis  M25.511 (ICD-10-CM) - Pain in right shoulder  S/p Rt reverse total shoulder replacement   THERAPY DIAG:  Acute pain of right shoulder  Stiffness of right shoulder, not elsewhere classified  Muscle weakness (generalized)  Abnormal posture  Rationale for Evaluation and Treatment: Rehabilitation  ONSET DATE: surgery 08/11/23  SUBJECTIVE:                                                                                                                                                                                       SUBJECTIVE STATEMENT: Still no pain at rest.  8 weeks post op on 10/06/23  From eval: Pt presents to PT s/p Rt reverse total shoulder replacement performed 08/11/23.  [place] and had resultant torn rotator cuff and this lead to surgery. He is wearing sling just when out of the house.    Hand dominance: Right  PERTINENT HISTORY: Lumbar surgery x 2 in 1995, ORIF Rt ankle, TKA Lt 2021, fall in airport 06/2023 with concussion, rib fracture and Rt shoulder injury, Rt reverse TSA 08/11/23  PAIN:09/23/23 Are you having pain? Yes: NPRS scale: 0/10 at rest, 7/10 during movement Pain location: Rt shoulder  Pain description: stabbing, aching Aggravating factors: moving Rt UE Relieving factors: pain meds, rest, ice  PRECAUTIONS: Other: follow post-op protocol  RED FLAGS: None   WEIGHT BEARING RESTRICTIONS: No  FALLS:  Has patient fallen in last 6 months? Yes. Number of falls prior to surgery- torn rotator cuff and labrum  LIVING ENVIRONMENT: Lives with: lives with their spouse Lives in: House/apartment Stairs: No Has following equipment at home: Single point cane    PLOF: Independent and Leisure: travel  PATIENT GOALS: return to prior level of function, use Rt UE   NEXT MD VISIT: 10/07/23  OBJECTIVE:  Note: Objective measures were completed at Evaluation unless otherwise noted.  DIAGNOSTIC FINDINGS:  NA  PATIENT SURVEYS :  Quick Dash Disability/Symptom Score:= 79.5% disability   Minimally Clinically Important Difference (MCID): 15-20 points    COGNITION: Overall cognitive status: Within functional limits for tasks assessed     SENSATION: WFL  POSTURE: Forward head/rounded shoulders   UPPER EXTREMITY ROM:   Passive ROM Right eval Left eval Right  09/30/23  Shoulder flexion 60 WFLs  105  Shoulder extension     Shoulder abduction 50  65  Shoulder  adduction     Shoulder internal rotation 30    Shoulder external rotation 15  30  Elbow flexion     Elbow extension     Wrist flexion     Wrist extension  Wrist ulnar deviation     Wrist radial deviation     Wrist pronation     Wrist supination     (Blank rows = not tested)  UPPER EXTREMITY MMT: Not tested due to post-op restrictions  JOINT MOBILITY TESTING:  NA- post op  PALPATION:  Healing surgical incision over Rt anterior aspect of shoulder.  Reduced mobility and tenderness                                                                                                                              TREATMENT DATE:  10/12/23 Pulleys held due to sharp pain anteriorly Discussion of rTSA using pictures Seated with noodle along spine: scap retraction ---> ER isometric with yellow band 5 sec hold x 10 B Seated OH flexion with hands clasped x 20  S/L flex 2x 10 in pain free range S/L ER 2x10 feels in elbow beyond 90 deg S/L ABD 2x10 Supine Supine OH flexion with small noodle x 20 large towel roll under elbow Supine Isometrics  ER with red loop around wrists 10 x 5-10 sec hold painfree range Supine biceps curls 1# x 10, hammer curl x 10 Scar massage along incision due to pain reported. Most tender along proximal incision  10/07/23 Pulleys flexion and scaption x 3 min Standing with noodle along spine: scap retraction ---> ER x 10 B Seated OH flexion with hands clasped x 15 neck  Worked on scapular depression in standing and in bent over position S/L flex x 10 some pain in first part of arc S/L ER 2x10 feels in elbow beyond 90 deg Supine OH flexion with clasped hands x 5 large towel roll under elbow Supine OH flexion with small noodle x 20 large towel roll under elbow Supine Isometrics  ER with red loop around wrists 10 x 5-10 sec hold painfree range Supine shoulder depression x 10 Supine biceps curls 3# x 20   10/05/23 Pulleys flexion and scaption x 3 min ea with  noodle behind back horizontally Standing with noodle along spine: scap retraction ---> ER x 20 B Seated OH flexion with hands clasped x 20 no pain Isometrics ABD and ER into wall 5 x 10 sec hold  Supine  OH flex with clasped hands x 15 post manual Manual:scapular mobs in supine to relax shoulder; STM to R infraspinatus, PROM R shoulder (towel rolls supporting elbow) flex, ER and ABD ROM today: 115 deg flexion 28 deg ER   09/30/23 Pulleys flexion and scaption x 3 min ea with noodle behind back horizontally Wall ladder x 3 challenging and a little painful Standing with noodle along spine: scap retraction ---> ER x 20 B Standing shoulder ABD AA with cane x 5 painful in ant shoulder Seated OH flexion with hands clasped x 20 no pain Isometrics ER into wall 5 x 10 sec hold - pt cued to not push as he was causing marked pain anteriorly Atttempted ER isometric walkout  with yellow but too difficult Supine chest press with dowel x 10, OH flex x 10 Manual:STM and IASTM to R  biceps, PROM R shoulder (towel rolls supporting upper arm)   PATIENT EDUCATION: Education details: [email or web address], over the door pulleys, scar mobs on Rt   Person educated: Patient Education method: Explanation, Demonstration, and Handouts Education comprehension: verbalized understanding and returned demonstration  HOME EXERCISE PROGRAM: Access Code: [email or web address] URL: [email or web address] Date: 10/07/2023 Prepared by: Mliss  Exercises - Seated Shoulder Shrug Circles AROM Backward  - 5 x daily - 7 x weekly - 1 sets - 10 reps - Seated Shoulder Flexion Towel Slide at Table Top  - 2-3 x daily - 7 x weekly - 1 sets - 10 reps - Seated Elbow Flexion and Extension AROM  - 3 x daily - 7 x weekly - 1 sets - 10 reps - Supine Shoulder Flexion AAROM with Hands Clasped  - 1 x daily - 7 x weekly - 3 sets - 10 reps - Supine Shoulder External Rotation with Dowel  - 2 x daily - 7 x weekly - 1 sets - 10 reps - 10 hold - Isometric  Shoulder Abduction at Wall  - 2 x daily - 7 x weekly - 2 sets - 5-10 reps - 5 hold - Standing Isometric Shoulder External Rotation with Doorway and Towel Roll  - 2 x daily - 7 x weekly - 2 sets - 5-10 reps - 5 hold - Sidelying Shoulder External Rotation AROM  - 1 x daily - 3 x weekly - 1-3 sets - 10 reps - Sidelying Shoulder Flexion 15 Degrees  - 1 x daily - 3-4 x weekly - 1-3 sets - 10 reps - 3 sec hold  ASSESSMENT:  CLINICAL IMPRESSION: Patient with increased stabbing pain again today anteriorly. TTP with scar massage. He tolerated all TE except pullyes, so we held these today.  He requires ongoing support under elbow in supine to pain in anterior shoulder and some exercises are done in smaller ranges for now.  Pt  9 weeks post op on 10/13/23.     OBJECTIVE IMPAIRMENTS: decreased ROM, decreased strength, increased edema, increased fascial restrictions, impaired perceived functional ability, increased muscle spasms, impaired flexibility, impaired UE functional use, postural dysfunction, and pain.   ACTIVITY LIMITATIONS: carrying, lifting, dressing, self feeding, reach over head, and hygiene/grooming  PARTICIPATION LIMITATIONS: meal prep, cleaning, laundry, driving, community activity, and yard work  PERSONAL FACTORS: Age and 1 comorbidity: s/p Rt TSA are also affecting patient's functional outcome.   REHAB POTENTIAL: Good  CLINICAL DECISION MAKING: Stable/uncomplicated  EVALUATION COMPLEXITY: Low  GOALS: Goals reviewed with patient? Yes  SHORT TERM GOALS: Target date: 10/12/2023    Be independent in initial HEP Baseline: has initial HEP (09/23/23) Goal status: IN PROGRESS  2.  Demonstrate Rt shoulder P/ROM flexion to > or = to 100 degrees to promote overhead reaching  Baseline:  Goal status: MET   3.  Improve QuickDASH to < or = to 68% disability  Baseline: 79%  Goal status: INITIAL  4.  Wean from sling and report > or = to 30% use of Rt UE with ADLs and dressing  Baseline:   Goal status: INITIAL  5.  Demonstrate Rt shoulder P/ROM ER to > or = to 45 degrees to improve reaching behind head  Baseline: 15 Goal status: INITIAL    LONG TERM GOALS: Target date: 11/09/2023    Be independent in advanced HEP Baseline:  Goal status: INITIAL  2.  Improve QuickDASH to < or = to 50% disability for use with functional tasks  Baseline: 79%  Goal status: INITIAL  3.  Demonstrate Rt shoulder A/ROM flexion to > or = to 110 degrees for reaching overhead Baseline:  Goal status: INITIAL  4.  Report > or = to 60% use of Rt UE with ADLs and self-care due to improve ROM and functional strength  Baseline:  Goal status: INITIAL  5.  Demonstrate Rt UE A/ROM ER to occiput to aid with self-care Baseline:  Goal status: INITIAL    PLAN: PT FREQUENCY: 2x/week  PT DURATION: 8 weeks  PLANNED INTERVENTIONS: 97110-Therapeutic exercises, 97530- Therapeutic activity, [id number]- Neuromuscular re-education, 97535- Self Care, [id number]- Manual therapy, [phone]- Canalith repositioning, [id number]- Aquatic Therapy, [id number]- Electrical stimulation (unattended), 20560 (1-2 muscles), [id number] (3+ muscles)- Dry Needling, Patient/Family education, Taping, Joint mobilization, Scar mobilization, Cryotherapy, and Moist heat  PLAN FOR NEXT SESSION:  progress per protocol, continue with STM prn. Work on AROM as tolerated.   Mliss Cummins, PT

## 2023-10-14 ENCOUNTER — Ambulatory Visit: Admitting: Physical Therapy

## 2023-10-14 ENCOUNTER — Encounter: Payer: Self-pay | Admitting: Physical Therapy

## 2023-10-14 DIAGNOSIS — R293 Abnormal posture: Secondary | ICD-10-CM | POA: Diagnosis not present

## 2023-10-14 DIAGNOSIS — M6281 Muscle weakness (generalized): Secondary | ICD-10-CM | POA: Diagnosis not present

## 2023-10-14 DIAGNOSIS — M25611 Stiffness of right shoulder, not elsewhere classified: Secondary | ICD-10-CM | POA: Diagnosis not present

## 2023-10-14 DIAGNOSIS — M25511 Pain in right shoulder: Secondary | ICD-10-CM

## 2023-10-14 NOTE — Therapy (Addendum)
 OUTPATIENT PHYSICAL THERAPY TREATMENT AND PROGRESS NOTE  Reporting Period 09/14/23 to 10/14/23   See note below for Objective Data and Assessment of Progress/Goals.    Patient Name: Andrew Moran MRN: [id number] DOB:1946-02-06, 78 y.o., male Today's Date: 10/14/2023  END OF SESSION:  PT End of Session - 10/14/23 1614     Visit Number 10    Date for PT Re-Evaluation 11/09/23    Authorization Type [employer] Medicare-16 vl approved : 09/14/2023 - 11/09/2023    Authorization - Visit Number 10    Authorization - Number of Visits 16    Progress Note Due on Visit 11    PT Start Time 1535    PT Stop Time 1613    PT Time Calculation (min) 38 min    Activity Tolerance Patient tolerated treatment well    Behavior During Therapy [facility] for tasks assessed/performed                 Past Medical History:  Diagnosis Date   Arthritis    Asthma    pulmology-- dr wert--- cough varient versus uacs   GERD (gastroesophageal reflux disease)    per pt takes DGL supplement   History of diverticulitis of colon 2002   managed medically , no surgical intervention   History of DVT of lower extremity 06/27/2019   followed by dr g. loni--  completed 3 months xarelto  07/ 2021   History of kidney stones    Renal calculi    left side   Past Surgical History:  Procedure Laterality Date   ANKLE HARDWARE REMOVAL Right 2008   CARPAL TUNNEL RELEASE Bilateral left 1983;  right 1984   CYSTOSCOPY WITH RETROGRADE PYELOGRAM, URETEROSCOPY AND STENT PLACEMENT Left 02/23/2020   Procedure: CYSTOSCOPY WITH RETROGRADE PYELOGRAM, URETEROSCOPY, LITHOTRIPSY, STONE EXTRACTION,  AND STENT PLACEMENT;  Surgeon: Matilda Senior, MD;  Location: [facility];  Service: Urology;  Laterality: Left;   CYSTOSCOPY/RETROGRADE/URETEROSCOPY/STONE EXTRACTION WITH BASKET Bilateral 10/10/2013   Procedure: CYSTOSCOPY/URETEROSCOPY/STONE EXTRACTION WITH HOLMIUM LASER, bilateral retrograde, bilateral ureteral stents;   Surgeon: Senior CHRISTELLA Matilda, MD;  Location: [facility];  Service: Urology;  Laterality: Bilateral;   EXPLORATORY LAPAROTOMY  1970   EXPLORATORY STOMACH SURGERY FOR TEAR IN STOMACH WALL   EXTRACORPOREAL SHOCK WAVE LITHOTRIPSY Left 10/06/2019   Procedure: EXTRACORPOREAL SHOCK WAVE LITHOTRIPSY (ESWL);  Surgeon: Elisabeth Valli BIRCH, MD;  Location[facility];  Service: Urology;  Laterality: Left;   EXTRACORPOREAL SHOCK WAVE LITHOTRIPSY  multiple since 2002,  last one 02-28-2013   HOLMIUM LASER APPLICATION Left 10/10/2013   Procedure: HOLMIUM LASER APPLICATION;  Surgeon: Senior CHRISTELLA Matilda, MD;  Location: [facility];  Service: Urology;  Laterality: Left;   KNEE ARTHROSCOPY  02/05/2011   Procedure: ARTHROSCOPY KNEE;  Surgeon: Lynwood SQUIBB [place];  Location: [facility];  Service: Orthopedics;  Laterality: Right;  Right knee arthroscopy partial lateral and medial meniscectomy with condyle debridement   LUMBAR SPINE SURGERY  x2  1995   ORIF ANKLE FRACTURE Right 1988   ROTATOR CUFF REPAIR Bilateral right 02/16/2018;  left 1997   TONSILLECTOMY AND ADENOIDECTOMY  1951   TOTAL KNEE ARTHROPLASTY Left 04/08/2019   Procedure: TOTAL KNEE ARTHROPLASTY;  Surgeon: Gerome Charleston, MD;  Location: [facility];  Service: Orthopedics;  Laterality: Left;  with adductor canal   Patient Active Problem List   Diagnosis Date Noted   Cellulitis 08/17/2023   Asthma exacerbation 04/22/2021   History of total knee arthroplasty 04/08/2019   DOE (dyspnea on exertion) 11/09/2018  Cough variant asthma vs UACS  06/27/2016    PCP: Kip Righter, MD  REFERRING PROVIDER: Melita Drivers, MD  REFERRING DIAG:  Diagnosis  M25.511 (ICD-10-CM) - Pain in right shoulder  S/p Rt reverse total shoulder replacement   THERAPY DIAG:  Acute pain of right shoulder  Stiffness of right shoulder, not elsewhere classified  Muscle weakness (generalized)  Abnormal posture  Rationale for Evaluation and Treatment: Rehabilitation  ONSET  DATE: surgery 08/11/23  SUBJECTIVE:                                                                                                                                                                                      SUBJECTIVE STATEMENT: Still no pain at rest.  8 weeks post op on 10/06/23  From eval: Pt presents to PT s/p Rt reverse total shoulder replacement performed 08/11/23.  [place] and had resultant torn rotator cuff and this lead to surgery. He is wearing sling just when out of the house.    Hand dominance: Right  PERTINENT HISTORY: Lumbar surgery x 2 in 1995, ORIF Rt ankle, TKA Lt 2021, fall in airport 06/2023 with concussion, rib fracture and Rt shoulder injury, Rt reverse TSA 08/11/23  PAIN:09/23/23 Are you having pain? Yes: NPRS scale: 0/10 at rest, 7/10 during movement Pain location: Rt shoulder  Pain description: stabbing, aching Aggravating factors: moving Rt UE Relieving factors: pain meds, rest, ice  PRECAUTIONS: Other: follow post-op protocol  RED FLAGS: None   WEIGHT BEARING RESTRICTIONS: No  FALLS:  Has patient fallen in last 6 months? Yes. Number of falls prior to surgery- torn rotator cuff and labrum  LIVING ENVIRONMENT: Lives with: lives with their spouse Lives in: House/apartment Stairs: No Has following equipment at home: Single point cane    PLOF: Independent and Leisure: travel  PATIENT GOALS: return to prior level of function, use Rt UE   NEXT MD VISIT: 10/07/23  OBJECTIVE:  Note: Objective measures were completed at Evaluation unless otherwise noted.  DIAGNOSTIC FINDINGS:  NA  PATIENT SURVEYS :  Quick Dash Disability/Symptom Score:= 79.5% disability   Minimally Clinically Important Difference (MCID): 15-20 points    COGNITION: Overall cognitive status: Within functional limits for tasks assessed     SENSATION: WFL  POSTURE: Forward head/rounded shoulders   UPPER EXTREMITY ROM:   Passive ROM  Right eval Left eval Right  09/30/23  Shoulder flexion 60 WFLs  105  Shoulder extension     Shoulder abduction 50  65  Shoulder adduction     Shoulder internal rotation 30    Shoulder external rotation 15  30  Elbow flexion  Elbow extension     Wrist flexion     Wrist extension     Wrist ulnar deviation     Wrist radial deviation     Wrist pronation     Wrist supination     (Blank rows = not tested)  UPPER EXTREMITY MMT: Not tested due to post-op restrictions  JOINT MOBILITY TESTING:  NA- post op  PALPATION:  Healing surgical incision over Rt anterior aspect of shoulder.  Reduced mobility and tenderness                                                                                                                              TREATMENT DATE:  10/14/23 Seated with noodle along spine: scap retraction then retraction + ER isometric with yellow band 5 sec hold x 10 B cues to stand up straight; R shoulder ABD yellow band x 10; active ABD x 10 L cues avoid elevation Standing at mirror working on depressing scapula with active small range flexion and scaption - difficult Seated OH flexion with hands clasped x 10  S/L flex 1x 10 in pain free range - 2 pillows' 2nd set with bent arm x 10 S/L ER 5 sec 2x10  S/L ABD 2x10 - cues to be fully on his side to avoid pain Supine OH flexion with small noodle x 20 large towel roll under elbow Supine ER x 10 R only  Supine biceps curls 2# 2x 10, hammer curl 2x 10  10/12/23 Pulleys held due to sharp pain anteriorly Discussion of rTSA using pictures Seated with noodle along spine: scap retraction ---> ER isometric with yellow band 5 sec hold x 10 B Seated OH flexion with hands clasped x 20  S/L flex 2x 10 in pain free range S/L ER 2x10 feels in elbow beyond 90 deg S/L ABD 2x10 Supine Supine OH flexion with small noodle x 20 large towel roll under elbow Supine Isometrics  ER with red loop around wrists 10 x 5-10 sec hold painfree  range Supine biceps curls 1# x 10, hammer curl x 10 Scar massage along incision due to pain reported. Most tender along proximal incision  10/07/23 Pulleys flexion and scaption x 3 min Standing with noodle along spine: scap retraction ---> ER x 10 B Seated OH flexion with hands clasped x 15 neck  Worked on scapular depression in standing and in bent over position S/L flex x 10 some pain in first part of arc S/L ER 2x10 feels in elbow beyond 90 deg Supine OH flexion with clasped hands x 5 large towel roll under elbow Supine OH flexion with small noodle x 20 large towel roll under elbow Supine Isometrics  ER with red loop around wrists 10 x 5-10 sec hold painfree range Supine shoulder depression x 10 Supine biceps curls 3# x 20  10/05/23 Pulleys flexion and scaption x 3 min ea with noodle behind back horizontally Standing with noodle along spine: scap retraction --->  ER x 20 B Seated OH flexion with hands clasped x 20 no pain Isometrics ABD and ER into wall 5 x 10 sec hold  Supine  OH flex with clasped hands x 15 post manual Manual:scapular mobs in supine to relax shoulder; STM to R infraspinatus, PROM R shoulder (towel rolls supporting elbow) flex, ER and ABD ROM today: 115 deg flexion 28 deg ER   09/30/23 Pulleys flexion and scaption x 3 min ea with noodle behind back horizontally Wall ladder x 3 challenging and a little painful Standing with noodle along spine: scap retraction ---> ER x 20 B Standing shoulder ABD AA with cane x 5 painful in ant shoulder Seated OH flexion with hands clasped x 20 no pain Isometrics ER into wall 5 x 10 sec hold - pt cued to not push as he was causing marked pain anteriorly Atttempted ER isometric walkout with yellow but too difficult Supine chest press with dowel x 10, OH flex x 10 Manual:STM and IASTM to R  biceps, PROM R shoulder (towel rolls supporting upper arm)   PATIENT EDUCATION: Education details: [email or web address], over the door pulleys, scar  mobs on Rt   Person educated: Patient Education method: Explanation, Demonstration, and Handouts Education comprehension: verbalized understanding and returned demonstration  HOME EXERCISE PROGRAM: Access Code: [email or web address] URL: [email or web address] Date: 10/07/2023 Prepared by: Mliss  Exercises - Seated Shoulder Shrug Circles AROM Backward  - 5 x daily - 7 x weekly - 1 sets - 10 reps - Seated Shoulder Flexion Towel Slide at Table Top  - 2-3 x daily - 7 x weekly - 1 sets - 10 reps - Seated Elbow Flexion and Extension AROM  - 3 x daily - 7 x weekly - 1 sets - 10 reps - Supine Shoulder Flexion AAROM with Hands Clasped  - 1 x daily - 7 x weekly - 3 sets - 10 reps - Supine Shoulder External Rotation with Dowel  - 2 x daily - 7 x weekly - 1 sets - 10 reps - 10 hold - Isometric Shoulder Abduction at Wall  - 2 x daily - 7 x weekly - 2 sets - 5-10 reps - 5 hold - Standing Isometric Shoulder External Rotation with Doorway and Towel Roll  - 2 x daily - 7 x weekly - 2 sets - 5-10 reps - 5 hold - Sidelying Shoulder External Rotation AROM  - 1 x daily - 3 x weekly - 1-3 sets - 10 reps - Sidelying Shoulder Flexion 15 Degrees  - 1 x daily - 3-4 x weekly - 1-3 sets - 10 reps - 3 sec hold  ASSESSMENT:  CLINICAL IMPRESSION: Patient is progressing with ROM in anti-gravity positions. He continues to have strength deficits in flexion causing elevation of R shoulder. Focused on depression of scapula with flexion activities today.  No UT elevation noted with seated clasped hands, supine flexion with noodle and S/L flexion. Able to do ABD in S/L with no pain with cues to roll forward a bit.  Patient is progressing appropriately, but is somewhat limited by pain. He continues to demonstrate potential for improvement and would benefit from continued skilled therapy to address impairments.   Pt  9 weeks post op on 10/13/23.      OBJECTIVE IMPAIRMENTS: decreased ROM, decreased strength, increased edema,  increased fascial restrictions, impaired perceived functional ability, increased muscle spasms, impaired flexibility, impaired UE functional use, postural dysfunction, and pain.   ACTIVITY LIMITATIONS: carrying, lifting, dressing, self feeding, reach over head, and  hygiene/grooming  PARTICIPATION LIMITATIONS: meal prep, cleaning, laundry, driving, community activity, and yard work  PERSONAL FACTORS: Age and 1 comorbidity: s/p Rt TSA are also affecting patient's functional outcome.   REHAB POTENTIAL: Good  CLINICAL DECISION MAKING: Stable/uncomplicated  EVALUATION COMPLEXITY: Low  GOALS: Goals reviewed with patient? Yes  SHORT TERM GOALS: Target date: 10/12/2023    Be independent in initial HEP Baseline: has initial HEP (09/23/23) Goal status: IN PROGRESS  2.  Demonstrate Rt shoulder P/ROM flexion to > or = to 100 degrees to promote overhead reaching  Baseline:  Goal status: MET   3.  Improve QuickDASH to < or = to 68% disability  Baseline: 79%  Goal status: INITIAL  4.  Wean from sling and report > or = to 30% use of Rt UE with ADLs and dressing  Baseline:  Goal status: PARTIALLY MET (weaned from sling)  5.  Demonstrate Rt shoulder P/ROM ER to > or = to 45 degrees to improve reaching behind head  Baseline: 15 Goal status: IN PROGRESS 10/14/23    LONG TERM GOALS: Target date: 11/09/2023    Be independent in advanced HEP Baseline:  Goal status: INITIAL   2.  Improve QuickDASH to < or = to 50% disability for use with functional tasks  Baseline: 79%  Goal status: INITIAL  3.  Demonstrate Rt shoulder A/ROM flexion to > or = to 110 degrees for reaching overhead Baseline:  Goal status: INITIAL  4.  Report > or = to 60% use of Rt UE with ADLs and self-care due to improve ROM and functional strength  Baseline:  Goal status: INITIAL  5.  Demonstrate Rt UE A/ROM ER to occiput to aid with self-care Baseline:  Goal status: INITIAL    PLAN: PT FREQUENCY:  2x/week  PT DURATION: 8 weeks  PLANNED INTERVENTIONS: 97110-Therapeutic exercises, 97530- Therapeutic activity, [id number]- Neuromuscular re-education, 97535- Self Care, [id number]- Manual therapy, [phone]- Canalith repositioning, [id number]- Aquatic Therapy, [id number]- Electrical stimulation (unattended), [id number] (1-2 muscles), [id number] (3+ muscles)- Dry Needling, Patient/Family education, Taping, Joint mobilization, Scar mobilization, Cryotherapy, and Moist heat  PLAN FOR NEXT SESSION:  progress per protocol, continue with STM prn. Work on AROM as tolerated.   Mliss Cummins, PT

## 2023-10-16 NOTE — Therapy (Signed)
 OUTPATIENT PHYSICAL THERAPY TREATMENT NOTE   Patient Name: Andrew Moran MRN: [id number] DOB:01/19/1946, 78 y.o., male Today's Date: 10/19/2023  END OF SESSION:  PT End of Session - 10/19/23 1444     Visit Number 11    Date for PT Re-Evaluation 11/09/23    Authorization Type [employer] Medicare-16 vl approved : 09/14/2023 - 11/09/2023    Authorization - Visit Number 11    Authorization - Number of Visits 16    Progress Note Due on Visit 20    PT Start Time 1445    PT Stop Time 1531    PT Time Calculation (min) 46 min    Activity Tolerance Patient tolerated treatment well    Behavior During Therapy [facility] for tasks assessed/performed                  Past Medical History:  Diagnosis Date   Arthritis    Asthma    pulmology-- dr wert--- cough varient versus uacs   GERD (gastroesophageal reflux disease)    per pt takes DGL supplement   History of diverticulitis of colon 2002   managed medically , no surgical intervention   History of DVT of lower extremity 06/27/2019   followed by dr g. loni--  completed 3 months xarelto  07/ 2021   History of kidney stones    Renal calculi    left side   Past Surgical History:  Procedure Laterality Date   ANKLE HARDWARE REMOVAL Right 2008   CARPAL TUNNEL RELEASE Bilateral left 1983;  right 1984   CYSTOSCOPY WITH RETROGRADE PYELOGRAM, URETEROSCOPY AND STENT PLACEMENT Left 02/23/2020   Procedure: CYSTOSCOPY WITH RETROGRADE PYELOGRAM, URETEROSCOPY, LITHOTRIPSY, STONE EXTRACTION,  AND STENT PLACEMENT;  Surgeon: Matilda Senior, MD;  Location: [facility] [place];  Service: Urology;  Laterality: Left;   CYSTOSCOPY/RETROGRADE/URETEROSCOPY/STONE EXTRACTION WITH BASKET Bilateral 10/10/2013   Procedure: CYSTOSCOPY/URETEROSCOPY/STONE EXTRACTION WITH HOLMIUM LASER, bilateral retrograde, bilateral ureteral stents;  Surgeon: Senior CHRISTELLA Matilda, MD;  Location: [facility];  Service: Urology;  Laterality: Bilateral;   EXPLORATORY LAPAROTOMY  1970    EXPLORATORY STOMACH SURGERY FOR TEAR IN STOMACH WALL   EXTRACORPOREAL SHOCK WAVE LITHOTRIPSY Left 10/06/2019   Procedure: EXTRACORPOREAL SHOCK WAVE LITHOTRIPSY (ESWL);  Surgeon: Elisabeth Valli BIRCH, MD;  Location: [facility];  Service: Urology;  Laterality: Left;   EXTRACORPOREAL SHOCK WAVE LITHOTRIPSY  multiple since 2002,  last one 02-28-2013   HOLMIUM LASER APPLICATION Left 10/10/2013   Procedure: HOLMIUM LASER APPLICATION;  Surgeon: Senior CHRISTELLA Matilda, MD;  Location: [facility];  Service: Urology;  Laterality: Left;   KNEE ARTHROSCOPY  02/05/2011   Procedure: ARTHROSCOPY KNEE;  Surgeon: Lynwood SQUIBB [place];  Location: [facility];  Service: Orthopedics;  Laterality: Right;  Right knee arthroscopy partial lateral and medial meniscectomy with condyle debridement   LUMBAR SPINE SURGERY  x2  1995   ORIF ANKLE FRACTURE Right 1988   ROTATOR CUFF REPAIR Bilateral right 02/16/2018;  left 1997   TONSILLECTOMY AND ADENOIDECTOMY  1951   TOTAL KNEE ARTHROPLASTY Left 04/08/2019   Procedure: TOTAL KNEE ARTHROPLASTY;  Surgeon: Gerome Charleston, MD;  Location: [facility];  Service: Orthopedics;  Laterality: Left;  with adductor canal   Patient Active Problem List   Diagnosis Date Noted   Cellulitis 08/17/2023   Asthma exacerbation 04/22/2021   History of total knee arthroplasty 04/08/2019   DOE (dyspnea on exertion) 11/09/2018   Cough variant asthma vs UACS  06/27/2016    PCP: Kip Righter, MD  REFERRING PROVIDER: Melita Drivers,  MD  REFERRING DIAG:  Diagnosis  M25.511 (ICD-10-CM) - Pain in right shoulder  S/p Rt reverse total shoulder replacement   THERAPY DIAG:  Acute pain of right shoulder  Stiffness of right shoulder, not elsewhere classified  Muscle weakness (generalized)  Abnormal posture  Rationale for Evaluation and Treatment: Rehabilitation  ONSET DATE: surgery 08/11/23  SUBJECTIVE:                                                                                                                                                                                       SUBJECTIVE STATEMENT: Pain is only with exercise.   8 weeks post op on 10/06/23  From eval: Pt presents to PT s/p Rt reverse total shoulder replacement performed 08/11/23.  [place] and had resultant torn rotator cuff and this lead to surgery. He is wearing sling just when out of the house.    Hand dominance: Right  PERTINENT HISTORY: Lumbar surgery x 2 in 1995, ORIF Rt ankle, TKA Lt 2021, fall in airport 06/2023 with concussion, rib fracture and Rt shoulder injury, Rt reverse TSA 08/11/23  PAIN:09/23/23 Are you having pain? Yes: NPRS scale: 0/10 at rest, 7/10 during movement Pain location: Rt shoulder  Pain description: stabbing, aching Aggravating factors: moving Rt UE Relieving factors: pain meds, rest, ice  PRECAUTIONS: Other: follow post-op protocol  RED FLAGS: None   WEIGHT BEARING RESTRICTIONS: No  FALLS:  Has patient fallen in last 6 months? Yes. Number of falls prior to surgery- torn rotator cuff and labrum  LIVING ENVIRONMENT: Lives with: lives with their spouse Lives in: House/apartment Stairs: No Has following equipment at home: Single point cane    PLOF: Independent and Leisure: travel  PATIENT GOALS: return to prior level of function, use Rt UE   NEXT MD VISIT: 10/07/23  OBJECTIVE:  Note: Objective measures were completed at Evaluation unless otherwise noted.  DIAGNOSTIC FINDINGS:  NA  PATIENT SURVEYS :  Quick Dash Disability/Symptom Score:= 79.5% disability   10/19/23 Quick Dash  15.9 / 100 = 15.9 %  Minimally Clinically Important Difference (MCID): 15-20 points    COGNITION: Overall cognitive status: Within functional limits for tasks assessed     SENSATION: WFL  POSTURE: Forward head/rounded shoulders   UPPER EXTREMITY ROM:   Passive ROM Right eval Left eval Right  09/30/23 Right 10/19/23  Shoulder flexion 60 WFLs  105 109    Shoulder extension      Shoulder abduction 50  65 70  Shoulder adduction      Shoulder internal rotation 30     Shoulder external rotation 15  30 33  Elbow  flexion      Elbow extension      Wrist flexion      Wrist extension      Wrist ulnar deviation      Wrist radial deviation      Wrist pronation      Wrist supination      (Blank rows = not tested)  UPPER EXTREMITY MMT: Not tested due to post-op restrictions  JOINT MOBILITY TESTING:  NA- post op  PALPATION:  Healing surgical incision over Rt anterior aspect of shoulder.  Reduced mobility and tenderness                                                                                                                              TREATMENT DATE:  10/19/23 Seated with noodle along spine: scap retraction then retraction + ER isometric with yellow band 5 sec hold x 10 B cues to stand up straight; R shoulder ABD yellow band x 10; active ABD x 10 L cues avoid elevation Standing at mirror working on depressing scapula  Seated cervical lateral flexion B 5 sec hold x 10 Seated cervical flexion 5 sec hold x 10 Seated OH flexion with hands clasped x 10  S/L flex 1x 10 in pain free range - 2 pillows' S/L ER 1 x10  Supine OH flexion with clasped hands x 5, then with small noodle x 5 large towel roll under elbow Supine ER x 10 with yellow band  STM to R biceps to tolerance PROM all flex, ABD, ER   10/14/23 Seated with noodle along spine: scap retraction then retraction + ER isometric with yellow band 5 sec hold x 10 B cues to stand up straight; R shoulder ABD yellow band x 10; active ABD x 10 L cues avoid elevation Standing at mirror working on depressing scapula with active small range flexion and scaption - difficult Seated OH flexion with hands clasped x 10  S/L flex 1x 10 in pain free range - 2 pillows' 2nd set with bent arm x 10 S/L ER 5 sec 2x10  S/L ABD 2x10 - cues to be fully on his side to avoid pain Supine OH flexion with  small noodle x 20 large towel roll under elbow Supine ER x 10 R only  Supine biceps curls 2# 2x 10, hammer curl 2x 10  10/12/23 Pulleys held due to sharp pain anteriorly Discussion of rTSA using pictures Seated with noodle along spine: scap retraction ---> ER isometric with yellow band 5 sec hold x 10 B Seated OH flexion with hands clasped x 20  S/L flex 2x 10 in pain free range S/L ER 2x10 feels in elbow beyond 90 deg S/L ABD 2x10 Supine Supine OH flexion with small noodle x 20 large towel roll under elbow Supine Isometrics  ER with red loop around wrists 10 x 5-10 sec hold painfree range Supine biceps curls 1# x 10, hammer curl x 10 Scar massage along incision  due to pain reported. Most tender along proximal incision  10/07/23 Pulleys flexion and scaption x 3 min Standing with noodle along spine: scap retraction ---> ER x 10 B Seated OH flexion with hands clasped x 15 neck  Worked on scapular depression in standing and in bent over position S/L flex x 10 some pain in first part of arc S/L ER 2x10 feels in elbow beyond 90 deg Supine OH flexion with clasped hands x 5 large towel roll under elbow Supine OH flexion with small noodle x 20 large towel roll under elbow Supine Isometrics  ER with red loop around wrists 10 x 5-10 sec hold painfree range Supine shoulder depression x 10 Supine biceps curls 3# x 20  10/05/23 Pulleys flexion and scaption x 3 min ea with noodle behind back horizontally Standing with noodle along spine: scap retraction ---> ER x 20 B Seated OH flexion with hands clasped x 20 no pain Isometrics ABD and ER into wall 5 x 10 sec hold  Supine  OH flex with clasped hands x 15 post manual Manual:scapular mobs in supine to relax shoulder; STM to R infraspinatus, PROM R shoulder (towel rolls supporting elbow) flex, ER and ABD ROM today: 115 deg flexion 28 deg ER   09/30/23 Pulleys flexion and scaption x 3 min ea with noodle behind back horizontally Wall ladder x 3  challenging and a little painful Standing with noodle along spine: scap retraction ---> ER x 20 B Standing shoulder ABD AA with cane x 5 painful in ant shoulder Seated OH flexion with hands clasped x 20 no pain Isometrics ER into wall 5 x 10 sec hold - pt cued to not push as he was causing marked pain anteriorly Atttempted ER isometric walkout with yellow but too difficult Supine chest press with dowel x 10, OH flex x 10 Manual:STM and IASTM to R  biceps, PROM R shoulder (towel rolls supporting upper arm)   PATIENT EDUCATION: Education details: [email or web address], over the door pulleys, scar mobs on Rt   Person educated: Patient Education method: Explanation, Demonstration, and Handouts Education comprehension: verbalized understanding and returned demonstration  HOME EXERCISE PROGRAM: Access Code: [email or web address] URL: [email or web address] Date: 10/07/2023 Prepared by: Mliss  Exercises - Seated Shoulder Shrug Circles AROM Backward  - 5 x daily - 7 x weekly - 1 sets - 10 reps - Seated Shoulder Flexion Towel Slide at Table Top  - 2-3 x daily - 7 x weekly - 1 sets - 10 reps - Seated Elbow Flexion and Extension AROM  - 3 x daily - 7 x weekly - 1 sets - 10 reps - Supine Shoulder Flexion AAROM with Hands Clasped  - 1 x daily - 7 x weekly - 3 sets - 10 reps - Supine Shoulder External Rotation with Dowel  - 2 x daily - 7 x weekly - 1 sets - 10 reps - 10 hold - Isometric Shoulder Abduction at Wall  - 2 x daily - 7 x weekly - 2 sets - 5-10 reps - 5 hold - Standing Isometric Shoulder External Rotation with Doorway and Towel Roll  - 2 x daily - 7 x weekly - 2 sets - 5-10 reps - 5 hold - Sidelying Shoulder External Rotation AROM  - 1 x daily - 3 x weekly - 1-3 sets - 10 reps - Sidelying Shoulder Flexion 15 Degrees  - 1 x daily - 3-4 x weekly - 1-3 sets - 10 reps - 3 sec hold  ASSESSMENT:  CLINICAL IMPRESSION:  Pt progressing slowly with ROM. He is pleased with his functional level, but pain  affects his ability to exercise. He is limited in flexion and ER unless done in S/L. Otherwise he gets sharp pain in anterior shoulder. Added cervical mobility today.  Pt  10 weeks post op on 10/20/23.      OBJECTIVE IMPAIRMENTS: decreased ROM, decreased strength, increased edema, increased fascial restrictions, impaired perceived functional ability, increased muscle spasms, impaired flexibility, impaired UE functional use, postural dysfunction, and pain.   ACTIVITY LIMITATIONS: carrying, lifting, dressing, self feeding, reach over head, and hygiene/grooming  PARTICIPATION LIMITATIONS: meal prep, cleaning, laundry, driving, community activity, and yard work  PERSONAL FACTORS: Age and 1 comorbidity: s/p Rt TSA are also affecting patient's functional outcome.   REHAB POTENTIAL: Good  CLINICAL DECISION MAKING: Stable/uncomplicated  EVALUATION COMPLEXITY: Low  GOALS: Goals reviewed with patient? Yes  SHORT TERM GOALS: Target date: 10/12/2023    Be independent in initial HEP Baseline: has initial HEP (09/23/23) Goal status: IN PROGRESS     Demonstrate Rt shoulder P/ROM flexion to > or = to 100 degrees to promote overhead reaching  Baseline:  Goal status: MET     Improve QuickDASH to < or = to 68% disability  Baseline: 79%  Goal status: MET   Wean from sling and report > or = to 30% use of Rt UE with ADLs and dressing  Baseline:  Goal status: MET 50%   5Demonstrate Rt shoulder P/ROM ER to > or = to 45 degrees to improve reaching behind head  Baseline: 15 Goal status: IN PROGRESS 10/14/23    LONG TERM GOALS: Target date: 11/09/2023    Be independent in advanced HEP Baseline:  Goal status: IN PROGRESS  2.  Improve QuickDASH to < or = to 50% disability for use with functional tasks  Baseline: 79%  Goal status: MET 15.9 / 100 = 15.9 %    3.  Demonstrate Rt shoulder A/ROM flexion to > or = to 110 degrees for reaching overhead Baseline:  Goal status: IN PROGRESS  4.  Report  > or = to 60% use of Rt UE with ADLs and self-care due to improve ROM and functional strength  Baseline:  Goal status: IN PROGRESS  5.  Demonstrate Rt UE A/ROM ER to occiput to aid with self-care Baseline:  Goal status: INITIAL    PLAN: PT FREQUENCY: 2x/week  PT DURATION: 8 weeks  PLANNED INTERVENTIONS: 97110-Therapeutic exercises, 97530- Therapeutic activity, [id number]- Neuromuscular re-education, 97535- Self Care, [id number]- Manual therapy, [id number]- Canalith repositioning, [id number]- Aquatic Therapy, [id number]- Electrical stimulation (unattended), 20560 (1-2 muscles), 20561 (3+ muscles)- Dry Needling, Patient/Family education, Taping, Joint mobilization, Scar mobilization, Cryotherapy, and Moist heat  PLAN FOR NEXT SESSION: progress per protocol, continue with STM prn. Work on AROM as tolerated.   Mliss Cummins, PT

## 2023-10-19 ENCOUNTER — Encounter: Payer: Self-pay | Admitting: Physical Therapy

## 2023-10-19 ENCOUNTER — Ambulatory Visit: Attending: Orthopedic Surgery | Admitting: Physical Therapy

## 2023-10-19 DIAGNOSIS — R293 Abnormal posture: Secondary | ICD-10-CM | POA: Diagnosis not present

## 2023-10-19 DIAGNOSIS — M25511 Pain in right shoulder: Secondary | ICD-10-CM | POA: Insufficient documentation

## 2023-10-19 DIAGNOSIS — R252 Cramp and spasm: Secondary | ICD-10-CM | POA: Insufficient documentation

## 2023-10-19 DIAGNOSIS — M25611 Stiffness of right shoulder, not elsewhere classified: Secondary | ICD-10-CM | POA: Insufficient documentation

## 2023-10-19 DIAGNOSIS — M6281 Muscle weakness (generalized): Secondary | ICD-10-CM | POA: Insufficient documentation

## 2023-10-20 NOTE — Therapy (Signed)
 OUTPATIENT PHYSICAL THERAPY TREATMENT NOTE   Patient Name: Andrew Moran MRN: [id number] DOB:1945-06-23, 78 y.o., male Today's Date: 10/21/2023  END OF SESSION:  PT End of Session - 10/21/23 1455     Visit Number 12    Date for PT Re-Evaluation 11/09/23    Authorization Type [employer] Medicare-16 vl approved : 09/14/2023 - 11/09/2023    Authorization - Visit Number 12    Authorization - Number of Visits 16    Progress Note Due on Visit 20    PT Start Time 1450    PT Stop Time 1530    PT Time Calculation (min) 40 min    Activity Tolerance Patient tolerated treatment well    Behavior During Therapy [facility] for tasks assessed/performed                   Past Medical History:  Diagnosis Date   Arthritis    Asthma    pulmology-- dr wert--- cough varient versus uacs   GERD (gastroesophageal reflux disease)    per pt takes DGL supplement   History of diverticulitis of colon 2002   managed medically , no surgical intervention   History of DVT of lower extremity 06/27/2019   followed by dr g. loni--  completed 3 months xarelto  07/ 2021   History of kidney stones    Renal calculi    left side   Past Surgical History:  Procedure Laterality Date   ANKLE HARDWARE REMOVAL Right 2008   CARPAL TUNNEL RELEASE Bilateral left 1983;  right 1984   CYSTOSCOPY WITH RETROGRADE PYELOGRAM, URETEROSCOPY AND STENT PLACEMENT Left 02/23/2020   Procedure: CYSTOSCOPY WITH RETROGRADE PYELOGRAM, URETEROSCOPY, LITHOTRIPSY, STONE EXTRACTION,  AND STENT PLACEMENT;  Surgeon: Matilda Senior, MD;  Location: [facility];  Service: Urology;  Laterality: Left;   CYSTOSCOPY/RETROGRADE/URETEROSCOPY/STONE EXTRACTION WITH BASKET Bilateral 10/10/2013   Procedure: CYSTOSCOPY/URETEROSCOPY/STONE EXTRACTION WITH HOLMIUM LASER, bilateral retrograde, bilateral ureteral stents;  Surgeon: Senior CHRISTELLA Matilda, MD;  Location: [facility];  Service: Urology;  Laterality: Bilateral;   EXPLORATORY LAPAROTOMY   1970   EXPLORATORY STOMACH SURGERY FOR TEAR IN STOMACH WALL   EXTRACORPOREAL SHOCK WAVE LITHOTRIPSY Left 10/06/2019   Procedure: EXTRACORPOREAL SHOCK WAVE LITHOTRIPSY (ESWL);  Surgeon: Elisabeth Valli BIRCH, MD;  Location: [facility];  Service: Urology;  Laterality: Left;   EXTRACORPOREAL SHOCK WAVE LITHOTRIPSY  multiple since 2002,  last one 02-28-2013   HOLMIUM LASER APPLICATION Left 10/10/2013   Procedure: HOLMIUM LASER APPLICATION;  Surgeon: Senior CHRISTELLA Matilda, MD;  Location: [facility];  Service: Urology;  Laterality: Left;   KNEE ARTHROSCOPY  02/05/2011   Procedure: ARTHROSCOPY KNEE;  Surgeon: Lynwood SQUIBB [place];  Location: [facility];  Service: Orthopedics;  Laterality: Right;  Right knee arthroscopy partial lateral and medial meniscectomy with condyle debridement   LUMBAR SPINE SURGERY  x2  1995   ORIF ANKLE FRACTURE Right 1988   ROTATOR CUFF REPAIR Bilateral right 02/16/2018;  left 1997   TONSILLECTOMY AND ADENOIDECTOMY  1951   TOTAL KNEE ARTHROPLASTY Left 04/08/2019   Procedure: TOTAL KNEE ARTHROPLASTY;  Surgeon: Gerome Charleston, MD;  Location: [facility];  Service: Orthopedics;  Laterality: Left;  with adductor canal   Patient Active Problem List   Diagnosis Date Noted   Cellulitis 08/17/2023   Asthma exacerbation 04/22/2021   History of total knee arthroplasty 04/08/2019   DOE (dyspnea on exertion) 11/09/2018   Cough variant asthma vs UACS  06/27/2016    PCP: Kip Righter, MD  REFERRING PROVIDER: Supple,  Franky, MD  REFERRING DIAG:  Diagnosis  M25.511 (ICD-10-CM) - Pain in right shoulder  S/p Rt reverse total shoulder replacement   THERAPY DIAG:  Acute pain of right shoulder  Stiffness of right shoulder, not elsewhere classified  Muscle weakness (generalized)  Abnormal posture  Rationale for Evaluation and Treatment: Rehabilitation  ONSET DATE: surgery 08/11/23  SUBJECTIVE:                                                                                                                                                                                       SUBJECTIVE STATEMENT: Nothing new to report. Ongoing sharp pain with certain exercises.   8 weeks post op on 10/06/23  From eval: Pt presents to PT s/p Rt reverse total shoulder replacement performed 08/11/23.  [place] the [place] and had resultant torn rotator cuff and this lead to surgery. He is wearing sling just when out of the house.    Hand dominance: Right  PERTINENT HISTORY: Lumbar surgery x 2 in 1995, ORIF Rt ankle, TKA Lt 2021, fall in airport 06/2023 with concussion, rib fracture and Rt shoulder injury, Rt reverse TSA 08/11/23  PAIN:09/23/23 Are you having pain? Yes: NPRS scale: 0/10 at rest, 7/10 during movement Pain location: Rt shoulder  Pain description: stabbing, aching Aggravating factors: moving Rt UE Relieving factors: pain meds, rest, ice  PRECAUTIONS: Other: follow post-op protocol  RED FLAGS: None   WEIGHT BEARING RESTRICTIONS: No  FALLS:  Has patient fallen in last 6 months? Yes. Number of falls prior to surgery- torn rotator cuff and labrum  LIVING ENVIRONMENT: Lives with: lives with their spouse Lives in: House/apartment Stairs: No Has following equipment at home: Single point cane    PLOF: Independent and Leisure: travel  PATIENT GOALS: return to prior level of function, use Rt UE   NEXT MD VISIT: 10/07/23  OBJECTIVE:  Note: Objective measures were completed at Evaluation unless otherwise noted.  DIAGNOSTIC FINDINGS:  NA  PATIENT SURVEYS :  Quick Dash Disability/Symptom Score:= 79.5% disability   10/19/23 Quick Dash  15.9 / 100 = 15.9 %  Minimally Clinically Important Difference (MCID): 15-20 points    COGNITION: Overall cognitive status: Within functional limits for tasks assessed     SENSATION: WFL  POSTURE: Forward head/rounded shoulders   UPPER EXTREMITY ROM:   Passive ROM Right eval Left eval Right  09/30/23  Right 10/19/23 Right 8/6  Shoulder flexion 60 WFLs  105 109  117  Shoulder extension       Shoulder abduction 50  65 70   Shoulder adduction       Shoulder internal rotation 30  Shoulder external rotation 15  30 33 40  Elbow flexion       Elbow extension       Wrist flexion       Wrist extension       Wrist ulnar deviation       Wrist radial deviation       Wrist pronation       Wrist supination       (Blank rows = not tested)  UPPER EXTREMITY MMT: Not tested due to post-op restrictions  JOINT MOBILITY TESTING:  NA- post op  PALPATION:  Healing surgical incision over Rt anterior aspect of shoulder.  Reduced mobility and tenderness                                                                                                                              TREATMENT DATE:  10/21/23 STM and IASTM with blade in L S/L to R lats, then in supine to lat attachment, biceps and upper arm muscles Supine lat stretch knees 90/90 clasped hands and OH reach bent arms; feet on bolster for 90/90 position 3 x 10 (last set did with just R foot on bolster due to reports of back pain) PROM R shoudler flex and ER   10/19/23 Seated with noodle along spine: scap retraction then retraction + ER isometric with yellow band 5 sec hold x 10 B cues to stand up straight; R shoulder ABD yellow band x 10; active ABD x 10 L cues avoid elevation Standing at mirror working on depressing scapula  Seated cervical lateral flexion B 5 sec hold x 10 Seated cervical flexion 5 sec hold x 10 Seated OH flexion with hands clasped x 10  S/L flex 1x 10 in pain free range - 2 pillows' S/L ER 1 x10  Supine OH flexion with clasped hands x 5, then with small noodle x 5 large towel roll under elbow Supine ER x 10 with yellow band  STM to R biceps to tolerance PROM all flex, ABD, ER   10/14/23 Seated with noodle along spine: scap retraction then retraction + ER isometric with yellow band 5 sec hold x 10 B cues to stand  up straight; R shoulder ABD yellow band x 10; active ABD x 10 L cues avoid elevation Standing at mirror working on depressing scapula with active small range flexion and scaption - difficult Seated OH flexion with hands clasped x 10  S/L flex 1x 10 in pain free range - 2 pillows' 2nd set with bent arm x 10 S/L ER 5 sec 2x10  S/L ABD 2x10 - cues to be fully on his side to avoid pain Supine OH flexion with small noodle x 20 large towel roll under elbow Supine ER x 10 R only  Supine biceps curls 2# 2x 10, hammer curl 2x 10  10/12/23 Pulleys held due to sharp pain anteriorly Discussion of rTSA using pictures Seated with noodle along spine: scap retraction --->  ER isometric with yellow band 5 sec hold x 10 B Seated OH flexion with hands clasped x 20  S/L flex 2x 10 in pain free range S/L ER 2x10 feels in elbow beyond 90 deg S/L ABD 2x10 Supine Supine OH flexion with small noodle x 20 large towel roll under elbow Supine Isometrics  ER with red loop around wrists 10 x 5-10 sec hold painfree range Supine biceps curls 1# x 10, hammer curl x 10 Scar massage along incision due to pain reported. Most tender along proximal incision  10/07/23 Pulleys flexion and scaption x 3 min Standing with noodle along spine: scap retraction ---> ER x 10 B Seated OH flexion with hands clasped x 15 neck  Worked on scapular depression in standing and in bent over position S/L flex x 10 some pain in first part of arc S/L ER 2x10 feels in elbow beyond 90 deg Supine OH flexion with clasped hands x 5 large towel roll under elbow Supine OH flexion with small noodle x 20 large towel roll under elbow Supine Isometrics  ER with red loop around wrists 10 x 5-10 sec hold painfree range Supine shoulder depression x 10 Supine biceps curls 3# x 20  10/05/23 Pulleys flexion and scaption x 3 min ea with noodle behind back horizontally Standing with noodle along spine: scap retraction ---> ER x 20 B Seated OH flexion with  hands clasped x 20 no pain Isometrics ABD and ER into wall 5 x 10 sec hold  Supine  OH flex with clasped hands x 15 post manual Manual:scapular mobs in supine to relax shoulder; STM to R infraspinatus, PROM R shoulder (towel rolls supporting elbow) flex, ER and ABD ROM today: 115 deg flexion 28 deg ER   09/30/23 Pulleys flexion and scaption x 3 min ea with noodle behind back horizontally Wall ladder x 3 challenging and a little painful Standing with noodle along spine: scap retraction ---> ER x 20 B Standing shoulder ABD AA with cane x 5 painful in ant shoulder Seated OH flexion with hands clasped x 20 no pain Isometrics ER into wall 5 x 10 sec hold - pt cued to not push as he was causing marked pain anteriorly Atttempted ER isometric walkout with yellow but too difficult Supine chest press with dowel x 10, OH flex x 10 Manual:STM and IASTM to R  biceps, PROM R shoulder (towel rolls supporting upper arm)   PATIENT EDUCATION: Education details: [email or web address], over the door pulleys, scar mobs on Rt   Person educated: Patient Education method: Explanation, Demonstration, and Handouts Education comprehension: verbalized understanding and returned demonstration  HOME EXERCISE PROGRAM: Access Code: [email or web address] URL: [email or web address] Date: 10/07/2023 Prepared by: Mliss  Exercises - Seated Shoulder Shrug Circles AROM Backward  - 5 x daily - 7 x weekly - 1 sets - 10 reps - Seated Shoulder Flexion Towel Slide at Table Top  - 2-3 x daily - 7 x weekly - 1 sets - 10 reps - Seated Elbow Flexion and Extension AROM  - 3 x daily - 7 x weekly - 1 sets - 10 reps - Supine Shoulder Flexion AAROM with Hands Clasped  - 1 x daily - 7 x weekly - 3 sets - 10 reps - Supine Shoulder External Rotation with Dowel  - 2 x daily - 7 x weekly - 1 sets - 10 reps - 10 hold - Isometric Shoulder Abduction at Wall  - 2 x daily - 7 x weekly - 2 sets - 5-10  reps - 5 hold - Standing Isometric Shoulder External  Rotation with Doorway and Towel Roll  - 2 x daily - 7 x weekly - 2 sets - 5-10 reps - 5 hold - Sidelying Shoulder External Rotation AROM  - 1 x daily - 3 x weekly - 1-3 sets - 10 reps - Sidelying Shoulder Flexion 15 Degrees  - 1 x daily - 3-4 x weekly - 1-3 sets - 10 reps - 3 sec hold  ASSESSMENT:  CLINICAL IMPRESSION: Focused on R anterior shoulder pain today which seems to be coming from patient's R latissimus. He responded very well to manual therapy and supine lat stretch demonstrating a significant improvement in R shoulder flexion and ER at end of session.  Pt  10 weeks post op on 10/20/23.      OBJECTIVE IMPAIRMENTS: decreased ROM, decreased strength, increased edema, increased fascial restrictions, impaired perceived functional ability, increased muscle spasms, impaired flexibility, impaired UE functional use, postural dysfunction, and pain.   ACTIVITY LIMITATIONS: carrying, lifting, dressing, self feeding, reach over head, and hygiene/grooming  PARTICIPATION LIMITATIONS: meal prep, cleaning, laundry, driving, community activity, and yard work  PERSONAL FACTORS: Age and 1 comorbidity: s/p Rt TSA are also affecting patient's functional outcome.   REHAB POTENTIAL: Good  CLINICAL DECISION MAKING: Stable/uncomplicated  EVALUATION COMPLEXITY: Low  GOALS: Goals reviewed with patient? Yes  SHORT TERM GOALS: Target date: 10/12/2023    Be independent in initial HEP Baseline: has initial HEP (09/23/23) Goal status: IN PROGRESS     Demonstrate Rt shoulder P/ROM flexion to > or = to 100 degrees to promote overhead reaching  Baseline:  Goal status: MET     Improve QuickDASH to < or = to 68% disability  Baseline: 79%  Goal status: MET   Wean from sling and report > or = to 30% use of Rt UE with ADLs and dressing  Baseline:  Goal status: MET 50%   5Demonstrate Rt shoulder P/ROM ER to > or = to 45 degrees to improve reaching behind head  Baseline: 15 Goal status: IN PROGRESS  10/14/23    LONG TERM GOALS: Target date: 11/09/2023    Be independent in advanced HEP Baseline:  Goal status: IN PROGRESS  2.  Improve QuickDASH to < or = to 50% disability for use with functional tasks  Baseline: 79%  Goal status: MET 15.9 / 100 = 15.9 %    3.  Demonstrate Rt shoulder A/ROM flexion to > or = to 110 degrees for reaching overhead Baseline:  Goal status: IN PROGRESS  4.  Report > or = to 60% use of Rt UE with ADLs and self-care due to improve ROM and functional strength  Baseline:  Goal status: IN PROGRESS  5.  Demonstrate Rt UE A/ROM ER to occiput to aid with self-care Baseline:  Goal status: INITIAL    PLAN: PT FREQUENCY: 2x/week  PT DURATION: 8 weeks  PLANNED INTERVENTIONS: 97110-Therapeutic exercises, 97530- Therapeutic activity, [id number]- Neuromuscular re-education, 97535- Self Care, [id number]- Manual therapy, [phone]- Canalith repositioning, [id number]- Aquatic Therapy, [id number]- Electrical stimulation (unattended), 20560 (1-2 muscles), 20561 (3+ muscles)- Dry Needling, Patient/Family education, Taping, Joint mobilization, Scar mobilization, Cryotherapy, and Moist heat  PLAN FOR NEXT SESSION: progress per protocol, continue with STM prn. Work on AROM as tolerated.   Mliss Cummins, PT

## 2023-10-21 ENCOUNTER — Ambulatory Visit: Admitting: Physical Therapy

## 2023-10-21 ENCOUNTER — Encounter: Payer: Self-pay | Admitting: Physical Therapy

## 2023-10-21 DIAGNOSIS — M25611 Stiffness of right shoulder, not elsewhere classified: Secondary | ICD-10-CM | POA: Diagnosis not present

## 2023-10-21 DIAGNOSIS — M25511 Pain in right shoulder: Secondary | ICD-10-CM | POA: Diagnosis not present

## 2023-10-21 DIAGNOSIS — M6281 Muscle weakness (generalized): Secondary | ICD-10-CM

## 2023-10-21 DIAGNOSIS — R293 Abnormal posture: Secondary | ICD-10-CM | POA: Diagnosis not present

## 2023-10-21 DIAGNOSIS — R252 Cramp and spasm: Secondary | ICD-10-CM | POA: Diagnosis not present

## 2023-10-23 ENCOUNTER — Ambulatory Visit (HOSPITAL_BASED_OUTPATIENT_CLINIC_OR_DEPARTMENT_OTHER)

## 2023-10-23 ENCOUNTER — Ambulatory Visit (HOSPITAL_BASED_OUTPATIENT_CLINIC_OR_DEPARTMENT_OTHER): Admitting: Adult Health

## 2023-10-23 ENCOUNTER — Ambulatory Visit: Payer: Self-pay | Admitting: Adult Health

## 2023-10-23 ENCOUNTER — Encounter (HOSPITAL_BASED_OUTPATIENT_CLINIC_OR_DEPARTMENT_OTHER): Payer: Self-pay | Admitting: Adult Health

## 2023-10-23 VITALS — BP 126/64 | HR 84 | Ht 65.0 in | Wt 173.0 lb

## 2023-10-23 DIAGNOSIS — J4531 Mild persistent asthma with (acute) exacerbation: Secondary | ICD-10-CM

## 2023-10-23 DIAGNOSIS — J45901 Unspecified asthma with (acute) exacerbation: Secondary | ICD-10-CM

## 2023-10-23 MED ORDER — BREZTRI AEROSPHERE 160-9-4.8 MCG/ACT IN AERO: 2.0000 | INHALATION_SPRAY | Freq: Two times a day (BID) | RESPIRATORY_TRACT | Status: DC

## 2023-10-23 MED ORDER — BREZTRI AEROSPHERE 160-9-4.8 MCG/ACT IN AERO: 2.0000 | INHALATION_SPRAY | Freq: Two times a day (BID) | RESPIRATORY_TRACT | 5 refills | Status: AC

## 2023-10-23 NOTE — Patient Instructions (Addendum)
 Stop Symbicort   Begin Breztri  2 puffs Twice daily, rinse after use.  Albuterol  inhaler As needed   Begin Claritin 10mg  At bedtime   Begin Pepcid  20mg  At bedtime   Chest xray today  Follow up in 6 weeks with PFT with Candis Dandy PA and As needed  -[facility] Please contact office for sooner follow up if symptoms do not improve or worsen or seek emergency care

## 2023-10-23 NOTE — Progress Notes (Signed)
 @Patient  ID: Debby JAYSON Most, male    DOB: 08/24/1945, 78 y.o.   MRN: [id number]  Chief Complaint  Patient presents with   Follow-up    Referring provider: Kip Righter, MD  HPI: 78 year old male never smoker followed for asthma Medical history significant for DVT  TEST/EVENTS :   10/23/2023 Follow up ; Asthma  Discussed the use of AI scribe software for clinical note transcription with the patient, who gave verbal consent to proceed.  History of Present Illness Shantel Wesely is a 78 year old male with asthma who presents with worsening asthma symptoms.  He has a history of asthma and notes that his symptoms have worsened recently. Previously, he was using Symbicort , but it is no longer effective. He experienced significant improvement with Breztri  samples, which he used until they ran out. Since then, he has returned to Symbicort , which does not control his symptoms effectively. He experiences shortness of breath and coughing, particularly when the Symbicort  wears off, and these symptoms improve with Breztri .  He reports shortness of breath, especially at night before taking his inhaler. He notes that his asthma symptoms have been worse this summer compared to previous years. He underwent a CT scan around the time of his hospitalization in June for the  shoulder procedure. He has a history of a blood clot in his leg in 2021 following knee replacement surgery.  CT chest August 17, 2023 was negative for PE showed mild atelectasis in the lung bases.  He has no heartburn, indigestion, sinus drainage, or postnasal drip. He occasionally takes an allergy  pill when needed. He has a dry cough and a raspy voice, which have persisted for a while. He sleeps with his head elevated due to GERD, which he has had for years. He is not currently on medication for GERD but uses DGL licorice supplements. He previously used Tagamet for many years.  He has an albuterol  inhaler for use as needed. There  have been no recent changes at home, such as new pets or moving. He has a long-standing pet cat. He is currently in physical therapy for his shoulder and is unable to drive due to limited arm mobility post-surgery.    Allergies  Allergen Reactions   Zithromax [Azithromycin] Shortness Of Breath   Betadine  [Povidone Iodine ] Itching    Iodine  drape, if left on too long   Wound Dressing Adhesive Other (See Comments)    Irritation     Immunization History  Administered Date(s) Administered   Fluad Quad(high Dose 65+) 01/14/2021   Influenza Whole 12/15/2016   Influenza, High Dose Seasonal PF 01/27/2016, 01/16/2019   Influenza-Unspecified 01/15/2018   PFIZER(Purple Top)SARS-COV-2 Vaccination 05/09/2019, 05/30/2019   Pneumococcal Conjugate-13 12/14/2014, 06/27/2015   Pneumococcal Polysaccharide-23 03/26/2012, 06/27/2014   Tdap 11/20/2011    Past Medical History:  Diagnosis Date   Arthritis    Asthma    pulmology-- dr wert--- cough varient versus uacs   GERD (gastroesophageal reflux disease)    per pt takes DGL supplement   History of diverticulitis of colon 2002   managed medically , no surgical intervention   History of DVT of lower extremity 06/27/2019   followed by dr g. loni--  completed 3 months xarelto  07/ 2021   History of kidney stones    Renal calculi    left side    Tobacco History: Social History   Tobacco Use  Smoking Status Never  Smokeless Tobacco Never   Counseling given: Not Answered   Outpatient  Medications Prior to Visit  Medication Sig Dispense Refill   acetaminophen  (TYLENOL ) 500 MG tablet Take 500-1,000 mg by mouth every 6 (six) hours as needed for moderate pain (pain score 4-6).     albuterol  (VENTOLIN  HFA) 108 (90 Base) MCG/ACT inhaler Inhale 2 puffs into the lungs every 6 (six) hours as needed for wheezing or shortness of breath.     atorvastatin  (LIPITOR) 20 MG tablet Take 20 mg by mouth daily.     budesonide -formoterol  (SYMBICORT ) 80-4.5  MCG/ACT inhaler Inhale 2 puffs into the lungs 2 (two) times daily. 1 each 12   budesonide -glycopyrrolate-formoterol  (BREZTRI  AEROSPHERE) 160-9-4.8 MCG/ACT AERO inhaler Inhale 2 puffs into the lungs in the morning and at bedtime.     Multiple Vitamins-Minerals (MULTIVITAMINS THER. W/MINERALS) TABS Take 1 tablet by mouth daily.     OVER THE COUNTER MEDICATION Take 2 tablets by mouth at bedtime. Deglycyrrhizinated-DGL     predniSONE  (DELTASONE ) 10 MG tablet 4 X 2 DAYS, 3 X 2 DAYS, 2 X 2 DAYS, 1 X 2 DAYS 20 tablet 0   No facility-administered medications prior to visit.     Review of Systems:   Constitutional:   No  weight loss, night sweats,  Fevers, chills, fatigue, or  lassitude.  HEENT:   No headaches,  Difficulty swallowing,  Tooth/dental problems, or  Sore throat,                No sneezing, itching, ear ache, nasal congestion, post nasal drip,   CV:  No chest pain,  Orthopnea, PND, swelling in lower extremities, anasarca, dizziness, palpitations, syncope.   GI  No heartburn, indigestion, abdominal pain, nausea, vomiting, diarrhea, change in bowel habits, loss of appetite, bloody stools.   Resp: .  No wheezing.  No chest wall deformity  Skin: no rash or lesions.  GU: no dysuria, change in color of urine, no urgency or frequency.  No flank pain, no hematuria   MS:  No joint pain or swelling.  No decreased range of motion.  No back pain.    Physical Exam  BP 126/64 (BP Location: Left Arm, Patient Position: Sitting, Cuff Size: Large)   Pulse 84   Ht 5' 5 (1.651 m)   Wt 173 lb (78.5 kg)   SpO2 97%   BMI 28.79 kg/m   GEN: A/Ox3; pleasant , NAD, well nourished    HEENT:  [place]/AT,   NOSE-clear, THROAT-clear, no lesions, no postnasal drip or exudate noted.   NECK:  Supple w/ fair ROM; no JVD; normal carotid impulses w/o bruits; no thyromegaly or nodules palpated; no lymphadenopathy.    RESP  Clear  P & A; w/o, wheezes/ rales/ or rhonchi. no accessory muscle use, no dullness to  percussion  CARD:  RRR, no m/r/g, no peripheral edema, pulses intact, no cyanosis or clubbing.  GI:   Soft & nt; nml bowel sounds; no organomegaly or masses detected.   Musco: Warm bil, decreased right arm range of motion  Neuro: alert, no focal deficits noted.    Skin: Warm, no lesions or rashes    Lab Results:  CBC    BNP No results found for: BNP  ProBNP No results found for: PROBNP  Imaging: No results found.  Administration History     None           No data to display          Lab Results  Component Value Date   NITRICOXIDE 22 05/10/2018  Assessment & Plan:   No problem-specific Assessment & Plan notes found for this encounter. Assessment and Plan Assessment & Plan Asthma with suboptimal control   Asthma symptoms have worsened since switching back to Symbicort  after Breztri  samples ran out, with shortness of breath and coughing, especially when Symbicort  wears off. Breztri  was effective in controlling symptoms. Differential includes asthma exacerbation possibly due to reflux or drainage. Recent CT scan  was neg for acute process/PE. Oxygen levels are stable, with no hemoptysis or chest pain. Discontinue Symbicort  and initiate Breztri  with a sample provided and prescription sent to pharmacy. Instruct to rinse mouth after using Breztri . Continue using albuterol  inhaler as needed. Order chest x-ray to assess current lung status and perform PFT  Start daily Claritin for potential allergy -related symptoms and begin Pepcid  for reflux management. If not improving will need to further evaluate with labs/imaging.   Chronic gastroesophageal reflux disease (GERD)   GERD is a potential trigger for asthma exacerbation. Currently managed with DGL, but not on any formal GERD medication. No recent reflux symptoms reported. Initiate Pepcid  for GERD management.  Deconditioning after recent left shoulder reverse total arthroplasty   Deconditioning noted  post-surgery, contributing to reduced endurance and potential impact on respiratory function. Physical therapy is ongoing for shoulder rehabilitation. Limited shoulder mobility affects daily activities, including driving. Recovery from surgery is ongoing, and endurance is expected to improve with continued rehabilitation. Continue physical therapy for shoulder rehabilitation.   Plan  Patient Instructions  Stop Symbicort   Begin Breztri  2 puffs Twice daily, rinse after use.  Albuterol  inhaler As needed   Begin Claritin 10mg  At bedtime   Begin Pepcid  20mg  At bedtime   Chest xray today  Follow up in 6 weeks with PFT with Candis Dandy PA and As needed  -[facility] Please contact office for sooner follow up if symptoms do not improve or worsen or seek emergency care       Madelin Stank, NP 10/23/2023

## 2023-10-26 ENCOUNTER — Ambulatory Visit

## 2023-10-26 DIAGNOSIS — M6281 Muscle weakness (generalized): Secondary | ICD-10-CM | POA: Diagnosis not present

## 2023-10-26 DIAGNOSIS — M25611 Stiffness of right shoulder, not elsewhere classified: Secondary | ICD-10-CM

## 2023-10-26 DIAGNOSIS — M25511 Pain in right shoulder: Secondary | ICD-10-CM

## 2023-10-26 DIAGNOSIS — R293 Abnormal posture: Secondary | ICD-10-CM

## 2023-10-26 DIAGNOSIS — R252 Cramp and spasm: Secondary | ICD-10-CM | POA: Diagnosis not present

## 2023-10-26 NOTE — Therapy (Signed)
 OUTPATIENT PHYSICAL THERAPY TREATMENT NOTE   Patient Name: Andrew Moran MRN: [id number] DOB:05-Apr-1945, 78 y.o., male Today's Date: 10/26/2023  END OF SESSION:  PT End of Session - 10/26/23 1539     Visit Number 13    Date for PT Re-Evaluation 11/09/23    Authorization Type [employer] Medicare-16 vl approved : 09/14/2023 - 11/09/2023    Authorization - Visit Number 13    Authorization - Number of Visits 16    Progress Note Due on Visit 20    PT Start Time 1446    PT Stop Time 1531    PT Time Calculation (min) 45 min    Activity Tolerance Patient tolerated treatment well    Behavior During Therapy [facility] for tasks assessed/performed                    Past Medical History:  Diagnosis Date   Arthritis    Asthma    pulmology-- dr wert--- cough varient versus uacs   GERD (gastroesophageal reflux disease)    per pt takes DGL supplement   History of diverticulitis of colon 2002   managed medically , no surgical intervention   History of DVT of lower extremity 06/27/2019   followed by dr g. loni--  completed 3 months xarelto  07/ 2021   History of kidney stones    Renal calculi    left side   Past Surgical History:  Procedure Laterality Date   ANKLE HARDWARE REMOVAL Right 2008   CARPAL TUNNEL RELEASE Bilateral left 1983;  right 1984   CYSTOSCOPY WITH RETROGRADE PYELOGRAM, URETEROSCOPY AND STENT PLACEMENT Left 02/23/2020   Procedure: CYSTOSCOPY WITH RETROGRADE PYELOGRAM, URETEROSCOPY, LITHOTRIPSY, STONE EXTRACTION,  AND STENT PLACEMENT;  Surgeon: Matilda Senior, MD;  Location: [facility];  Service: Urology;  Laterality: Left;   CYSTOSCOPY/RETROGRADE/URETEROSCOPY/STONE EXTRACTION WITH BASKET Bilateral 10/10/2013   Procedure: CYSTOSCOPY/URETEROSCOPY/STONE EXTRACTION WITH HOLMIUM LASER, bilateral retrograde, bilateral ureteral stents;  Surgeon: Senior CHRISTELLA Matilda, MD;  Location: [facility];  Service: Urology;  Laterality: Bilateral;   EXPLORATORY LAPAROTOMY   1970   EXPLORATORY STOMACH SURGERY FOR TEAR IN STOMACH WALL   EXTRACORPOREAL SHOCK WAVE LITHOTRIPSY Left 10/06/2019   Procedure: EXTRACORPOREAL SHOCK WAVE LITHOTRIPSY (ESWL);  Surgeon: Elisabeth Valli BIRCH, MD;  Location: [facility];  Service: Urology;  Laterality: Left;   EXTRACORPOREAL SHOCK WAVE LITHOTRIPSY  multiple since 2002,  last one 02-28-2013   HOLMIUM LASER APPLICATION Left 10/10/2013   Procedure: HOLMIUM LASER APPLICATION;  Surgeon: Senior CHRISTELLA Matilda, MD;  Location: [facility];  Service: Urology;  Laterality: Left;   KNEE ARTHROSCOPY  02/05/2011   Procedure: ARTHROSCOPY KNEE;  Surgeon: Lynwood SQUIBB [place];  Location: [facility];  Service: Orthopedics;  Laterality: Right;  Right knee arthroscopy partial lateral and medial meniscectomy with condyle debridement   LUMBAR SPINE SURGERY  x2  1995   ORIF ANKLE FRACTURE Right 1988   ROTATOR CUFF REPAIR Bilateral right 02/16/2018;  left 1997   TONSILLECTOMY AND ADENOIDECTOMY  1951   TOTAL KNEE ARTHROPLASTY Left 04/08/2019   Procedure: TOTAL KNEE ARTHROPLASTY;  Surgeon: Gerome Charleston, MD;  Location: [facility];  Service: Orthopedics;  Laterality: Left;  with adductor canal   Patient Active Problem List   Diagnosis Date Noted   Cellulitis 08/17/2023   Asthma exacerbation 04/22/2021   History of total knee arthroplasty 04/08/2019   DOE (dyspnea on exertion) 11/09/2018   Cough variant asthma vs UACS  06/27/2016    PCP: Kip Righter, MD  REFERRING PROVIDER:  Melita Drivers, MD  REFERRING DIAG:  Diagnosis  M25.511 (ICD-10-CM) - Pain in right shoulder  S/p Rt reverse total shoulder replacement   THERAPY DIAG:  Acute pain of right shoulder  Stiffness of right shoulder, not elsewhere classified  Muscle weakness (generalized)  Abnormal posture  Rationale for Evaluation and Treatment: Rehabilitation  ONSET DATE: surgery 08/11/23  SUBJECTIVE:                                                                                                                                                                                       SUBJECTIVE STATEMENT: I sometimes feel like I'm going backwards.  Able to use Rt UE for eating, brushing teeth and doing hair.  ~75% use of Rt UE  From eval: Pt presents to PT s/p Rt reverse total shoulder replacement performed 08/11/23.  [place] and had resultant torn rotator cuff and this lead to surgery. He is wearing sling just when out of the house.    Hand dominance: Right  PERTINENT HISTORY: Lumbar surgery x 2 in 1995, ORIF Rt ankle, TKA Lt 2021, fall in airport 06/2023 with concussion, rib fracture and Rt shoulder injury, Rt reverse TSA 08/11/23  PAIN:10/26/23 Are you having pain? Yes: NPRS scale: 0/10 at rest, up to/10 during movement Pain location: Rt shoulder  Pain description: stabbing, aching Aggravating factors: moving Rt UE into certain motions Relieving factors: pain meds, rest, ice  PRECAUTIONS: Other: follow post-op protocol  RED FLAGS: None   WEIGHT BEARING RESTRICTIONS: No  FALLS:  Has patient fallen in last 6 months? Yes. Number of falls prior to surgery- torn rotator cuff and labrum  LIVING ENVIRONMENT: Lives with: lives with their spouse Lives in: House/apartment Stairs: No Has following equipment at home: Single point cane    PLOF: Independent and Leisure: travel  PATIENT GOALS: return to prior level of function, use Rt UE   NEXT MD VISIT: 10/07/23  OBJECTIVE:  Note: Objective measures were completed at Evaluation unless otherwise noted.  DIAGNOSTIC FINDINGS:  NA  PATIENT SURVEYS :  Quick Dash Disability/Symptom Score:= 79.5% disability   10/19/23 Quick Dash  15.9 / 100 = 15.9 %  Minimally Clinically Important Difference (MCID): 15-20 points    COGNITION: Overall cognitive status: Within functional limits for tasks assessed     SENSATION: WFL  POSTURE: Forward head/rounded shoulders   UPPER EXTREMITY ROM:    Passive ROM Right eval Left eval Right  09/30/23 Right 10/19/23 Right 8/6  Shoulder flexion 60 WFLs  105 109  117  Shoulder extension       Shoulder abduction 50  65 70  Shoulder adduction       Shoulder internal rotation 30      Shoulder external rotation 15  30 33 40  Elbow flexion       Elbow extension       Wrist flexion       Wrist extension       Wrist ulnar deviation       Wrist radial deviation       Wrist pronation       Wrist supination       (Blank rows = not tested)  UPPER EXTREMITY MMT: Not tested due to post-op restrictions  JOINT MOBILITY TESTING:  NA- post op  PALPATION:  Healing surgical incision over Rt anterior aspect of shoulder.  Reduced mobility and tenderness                                                                                                                              TREATMENT DATE:  10/26/23 Seated with noodle along spine: scap retraction then retraction + ER isometric with yellow band 5 sec hold x 10 B cues to stand up straight; R shoulder ABD yellow band x 10; active flexion x 10 L cues avoid elevation Arm bike: level 1x 6 min (3/3)- PT present to discuss progress and to monitor for fatigue. Standing at mirror working on depressing scapula  Finger ladder: flexion with tactile cues to depress scapula 2x5 Seated cervical lateral flexion B 5 sec hold x 10 Seated cervical flexion 5 sec hold x 10 Shoulder extension and rows: red band 2x10 S/L flex 2x 10 in pain free range - 2 pillows S/L ER 2x10 Supine ER x 10 with yellow band  Manual: PROM all flex, ABD, ER   10/21/23 STM and IASTM with blade in L S/L to R lats, then in supine to lat attachment, biceps and upper arm muscles Supine lat stretch knees 90/90 clasped hands and OH reach bent arms; feet on bolster for 90/90 position 3 x 10 (last set did with just R foot on bolster due to reports of back pain) PROM R shoudler flex and ER   10/19/23 Seated with noodle along spine: scap  retraction then retraction + ER isometric with yellow band 5 sec hold x 10 B cues to stand up straight; R shoulder ABD yellow band x 10; active ABD x 10 L cues avoid elevation Standing at mirror working on depressing scapula  Seated cervical lateral flexion B 5 sec hold x 10 Seated cervical flexion 5 sec hold x 10 Seated OH flexion with hands clasped x 10  S/L flex 1x 10 in pain free range - 2 pillows' S/L ER 1 x10  Supine OH flexion with clasped hands x 5, then with small noodle x 5 large towel roll under elbow Supine ER x 10 with yellow band  STM to R biceps to tolerance PROM all flex, ABD, ER    PATIENT EDUCATION: Education details: [email or web address], over  the door pulleys, scar mobs on Rt   Person educated: Patient Education method: Explanation, Demonstration, and Handouts Education comprehension: verbalized understanding and returned demonstration  HOME EXERCISE PROGRAM: Access Code: [email or web address] URL: [email or web address] Date: 10/07/2023 Prepared by: Mliss  Exercises - Seated Shoulder Shrug Circles AROM Backward  - 5 x daily - 7 x weekly - 1 sets - 10 reps - Seated Shoulder Flexion Towel Slide at Table Top  - 2-3 x daily - 7 x weekly - 1 sets - 10 reps - Seated Elbow Flexion and Extension AROM  - 3 x daily - 7 x weekly - 1 sets - 10 reps - Supine Shoulder Flexion AAROM with Hands Clasped  - 1 x daily - 7 x weekly - 3 sets - 10 reps - Supine Shoulder External Rotation with Dowel  - 2 x daily - 7 x weekly - 1 sets - 10 reps - 10 hold - Isometric Shoulder Abduction at Wall  - 2 x daily - 7 x weekly - 2 sets - 5-10 reps - 5 hold - Standing Isometric Shoulder External Rotation with Doorway and Towel Roll  - 2 x daily - 7 x weekly - 2 sets - 5-10 reps - 5 hold - Sidelying Shoulder External Rotation AROM  - 1 x daily - 3 x weekly - 1-3 sets - 10 reps - Sidelying Shoulder Flexion 15 Degrees  - 1 x daily - 3-4 x weekly - 1-3 sets - 10 reps - 3 sec hold  ASSESSMENT:  CLINICAL  IMPRESSION: Pt reports 75% use of Rt UE with daily tasks.  A/ROM remains limited and overhead use is what limits his functional use.  Pt continues to work on scapular depression with movement against gravity.  He tolerated scapular strength with band today and had good improvement in ER against gravity in sidelying.  Pt with dizziness at times and we will assess next session. Sharp pain with ER P/ROM today.  Patient will benefit from skilled PT to address the below impairments and improve overall function.     OBJECTIVE IMPAIRMENTS: decreased ROM, decreased strength, increased edema, increased fascial restrictions, impaired perceived functional ability, increased muscle spasms, impaired flexibility, impaired UE functional use, postural dysfunction, and pain.   ACTIVITY LIMITATIONS: carrying, lifting, dressing, self feeding, reach over head, and hygiene/grooming  PARTICIPATION LIMITATIONS: meal prep, cleaning, laundry, driving, community activity, and yard work  PERSONAL FACTORS: Age and 1 comorbidity: s/p Rt TSA are also affecting patient's functional outcome.   REHAB POTENTIAL: Good  CLINICAL DECISION MAKING: Stable/uncomplicated  EVALUATION COMPLEXITY: Low  GOALS: Goals reviewed with patient? Yes  SHORT TERM GOALS: Target date: 10/12/2023    Be independent in initial HEP Baseline: has initial HEP (09/23/23) Goal status: IN PROGRESS     Demonstrate Rt shoulder P/ROM flexion to > or = to 100 degrees to promote overhead reaching  Baseline:  Goal status: MET     Improve QuickDASH to < or = to 68% disability  Baseline: 79%  Goal status: MET   Wean from sling and report > or = to 30% use of Rt UE with ADLs and dressing  Baseline:  Goal status: MET 50%   5Demonstrate Rt shoulder P/ROM ER to > or = to 45 degrees to improve reaching behind head  Baseline: 15 Goal status: IN PROGRESS 10/14/23    LONG TERM GOALS: Target date: 11/09/2023    Be independent in advanced  HEP Baseline:  Goal status: IN PROGRESS  2.  Improve QuickDASH to <  or = to 50% disability for use with functional tasks  Baseline: 79%  Goal status: MET 15.9 / 100 = 15.9 %    3.  Demonstrate Rt shoulder A/ROM flexion to > or = to 110 degrees for reaching overhead Baseline:  Goal status: IN PROGRESS  4.  Report > or = to 60% use of Rt UE with ADLs and self-care due to improve ROM and functional strength  Baseline:  Goal status: IN PROGRESS  5.  Demonstrate Rt UE A/ROM ER to occiput to aid with self-care Baseline:  Goal status: INITIAL    PLAN: PT FREQUENCY: 2x/week  PT DURATION: 8 weeks  PLANNED INTERVENTIONS: 97110-Therapeutic exercises, 97530- Therapeutic activity, [id number]- Neuromuscular re-education, 97535- Self Care, [id number]- Manual therapy, [phone]- Canalith repositioning, [id number]- Aquatic Therapy, [id number]- Electrical stimulation (unattended), 20560 (1-2 muscles), 20561 (3+ muscles)- Dry Needling, Patient/Family education, Taping, Joint mobilization, Scar mobilization, Cryotherapy, and Moist heat  PLAN FOR NEXT SESSION: progress per protocol, continue with STM prn. Work on AROM as tolerated.  Assess vestibular    Burnard Joy, PT 10/26/23 3:41 PM

## 2023-10-28 ENCOUNTER — Ambulatory Visit

## 2023-10-28 DIAGNOSIS — R293 Abnormal posture: Secondary | ICD-10-CM

## 2023-10-28 DIAGNOSIS — M25511 Pain in right shoulder: Secondary | ICD-10-CM | POA: Diagnosis not present

## 2023-10-28 DIAGNOSIS — M6281 Muscle weakness (generalized): Secondary | ICD-10-CM | POA: Diagnosis not present

## 2023-10-28 DIAGNOSIS — R252 Cramp and spasm: Secondary | ICD-10-CM | POA: Diagnosis not present

## 2023-10-28 DIAGNOSIS — M25611 Stiffness of right shoulder, not elsewhere classified: Secondary | ICD-10-CM | POA: Diagnosis not present

## 2023-10-28 NOTE — Therapy (Signed)
 OUTPATIENT PHYSICAL THERAPY TREATMENT NOTE   Patient Name: RAYON MCCHRISTIAN MRN: [id number] DOB:Oct 14, 1945, 78 y.o., male Today's Date: 10/28/2023  END OF SESSION:  PT End of Session - 10/28/23 1551     Visit Number 14    Date for PT Re-Evaluation 11/09/23    Authorization Type [employer] Medicare-16 vl approved : 09/14/2023 - 11/09/2023    Authorization - Number of Visits 16    Progress Note Due on Visit 20    PT Start Time 0245    PT Stop Time 0330    PT Time Calculation (min) 45 min    Activity Tolerance Patient tolerated treatment well    Behavior During Therapy [facility] for tasks assessed/performed                     Past Medical History:  Diagnosis Date   Arthritis    Asthma    pulmology-- dr wert--- cough varient versus uacs   GERD (gastroesophageal reflux disease)    per pt takes DGL supplement   History of diverticulitis of colon 2002   managed medically , no surgical intervention   History of DVT of lower extremity 06/27/2019   followed by dr g. loni--  completed 3 months xarelto  07/ 2021   History of kidney stones    Renal calculi    left side   Past Surgical History:  Procedure Laterality Date   ANKLE HARDWARE REMOVAL Right 2008   CARPAL TUNNEL RELEASE Bilateral left 1983;  right 1984   CYSTOSCOPY WITH RETROGRADE PYELOGRAM, URETEROSCOPY AND STENT PLACEMENT Left 02/23/2020   Procedure: CYSTOSCOPY WITH RETROGRADE PYELOGRAM, URETEROSCOPY, LITHOTRIPSY, STONE EXTRACTION,  AND STENT PLACEMENT;  Surgeon: Matilda Senior, MD;  Location[facility];  Service: Urology;  Laterality: Left;   CYSTOSCOPY/RETROGRADE/URETEROSCOPY/STONE EXTRACTION WITH BASKET Bilateral 10/10/2013   Procedure: CYSTOSCOPY/URETEROSCOPY/STONE EXTRACTION WITH HOLMIUM LASER, bilateral retrograde, bilateral ureteral stents;  Surgeon: Senior CHRISTELLA Matilda, MD;  Location: [facility];  Service: Urology;  Laterality: Bilateral;   EXPLORATORY LAPAROTOMY  1970   EXPLORATORY STOMACH  SURGERY FOR TEAR IN STOMACH WALL   EXTRACORPOREAL SHOCK WAVE LITHOTRIPSY Left 10/06/2019   Procedure: EXTRACORPOREAL SHOCK WAVE LITHOTRIPSY (ESWL);  Surgeon: Elisabeth Valli BIRCH, MD;  Location: [facility];  Service: Urology;  Laterality: Left;   EXTRACORPOREAL SHOCK WAVE LITHOTRIPSY  multiple since 2002,  last one 02-28-2013   HOLMIUM LASER APPLICATION Left 10/10/2013   Procedure: HOLMIUM LASER APPLICATION;  Surgeon: Senior CHRISTELLA Matilda, MD;  Location: [facility];  Service: Urology;  Laterality: Left;   KNEE ARTHROSCOPY  02/05/2011   Procedure: ARTHROSCOPY KNEE;  Surgeon: Lynwood SQUIBB [place];  Location: [facility];  Service: Orthopedics;  Laterality: Right;  Right knee arthroscopy partial lateral and medial meniscectomy with condyle debridement   LUMBAR SPINE SURGERY  x2  1995   ORIF ANKLE FRACTURE Right 1988   ROTATOR CUFF REPAIR Bilateral right 02/16/2018;  left 1997   TONSILLECTOMY AND ADENOIDECTOMY  1951   TOTAL KNEE ARTHROPLASTY Left 04/08/2019   Procedure: TOTAL KNEE ARTHROPLASTY;  Surgeon: Gerome Charleston, MD;  Location: [facility];  Service: Orthopedics;  Laterality: Left;  with adductor canal   Patient Active Problem List   Diagnosis Date Noted   Cellulitis 08/17/2023   Asthma exacerbation 04/22/2021   History of total knee arthroplasty 04/08/2019   DOE (dyspnea on exertion) 11/09/2018   Cough variant asthma vs UACS  06/27/2016    PCP: Kip Righter, MD  REFERRING PROVIDER: Melita Drivers, MD  REFERRING DIAG:  Diagnosis  M25.511 (ICD-10-CM) - Pain in right shoulder  S/p Rt reverse total shoulder replacement   THERAPY DIAG:  Acute pain of right shoulder  Abnormal posture  Stiffness of right shoulder, not elsewhere classified  Muscle weakness (generalized)  Rationale for Evaluation and Treatment: Rehabilitation  ONSET DATE: surgery 08/11/23  SUBJECTIVE:                                                                                                                                                                                       SUBJECTIVE STATEMENT: Pt reports having pain and dizziness throughout the week that comes and goes. He states he was a little sore from last session.   From eval: Pt presents to PT s/p Rt reverse total shoulder replacement performed 08/11/23.  [place] and had resultant torn rotator cuff and this lead to surgery. He is wearing sling just when out of the house.    Hand dominance: Right  PERTINENT HISTORY: Lumbar surgery x 2 in 1995, ORIF Rt ankle, TKA Lt 2021, fall in airport 06/2023 with concussion, rib fracture and Rt shoulder injury, Rt reverse TSA 08/11/23  PAIN:10/28/23 Are you having pain? Yes: NPRS scale: 0-3/10 at rest, up to/10 during movement Pain location: Rt shoulder  Pain description: stabbing, aching Aggravating factors: moving Rt UE into certain motions Relieving factors: pain meds, rest, ice  PRECAUTIONS: Other: follow post-op protocol  RED FLAGS: None   WEIGHT BEARING RESTRICTIONS: No  FALLS:  Has patient fallen in last 6 months? Yes. Number of falls prior to surgery- torn rotator cuff and labrum  LIVING ENVIRONMENT: Lives with: lives with their spouse Lives in: House/apartment Stairs: No Has following equipment at home: Single point cane    PLOF: Independent and Leisure: travel  PATIENT GOALS: return to prior level of function, use Rt UE   NEXT MD VISIT: 10/07/23  OBJECTIVE:  Note: Objective measures were completed at Evaluation unless otherwise noted.  DIAGNOSTIC FINDINGS:  NA  PATIENT SURVEYS :  Quick Dash Disability/Symptom Score:= 79.5% disability   10/19/23 Quick Dash  15.9 / 100 = 15.9 %  Minimally Clinically Important Difference (MCID): 15-20 points    COGNITION: Overall cognitive status: Within functional limits for tasks assessed     SENSATION: WFL  POSTURE: Forward head/rounded shoulders   UPPER EXTREMITY ROM:   Passive ROM Right eval  Left eval Right  09/30/23 Right 10/19/23 Right 8/6  Shoulder flexion 60 WFLs  105 109  117  Shoulder extension       Shoulder abduction 50  65 70   Shoulder adduction       Shoulder  internal rotation 30      Shoulder external rotation 15  30 33 40  Elbow flexion       Elbow extension       Wrist flexion       Wrist extension       Wrist ulnar deviation       Wrist radial deviation       Wrist pronation       Wrist supination       (Blank rows = not tested)  UPPER EXTREMITY MMT: Not tested due to post-op restrictions  JOINT MOBILITY TESTING:  NA- post op  PALPATION:  Healing surgical incision over Rt anterior aspect of shoulder.  Reduced mobility and tenderness                                                                                                                              TREATMENT DATE:   10/28/23 Arm bike level 1 x6 min (3/3)- PT student present to discuss progress and to monitor for fatigue Seated cervical flexion 5 sec hold x 10 Finger ladder: flexion with tactile cues to depress scapula 2x5 Seated cervical lateral flexion B 5 sec hold x 10 Seated shoulder ER yellow theraband 2x10 Positive Dix-Hallpike test for left sided nystagmus  Epley's maneuver -1 rep-for canalith repositioning for left sided nystagmus/mild dizziness  VOR x1 and x2 exercises for vestibular hypofunction   10/26/23 Seated with noodle along spine: scap retraction then retraction + ER isometric with yellow band 5 sec hold x 10 B cues to stand up straight; R shoulder ABD yellow band x 10; active flexion x 10 L cues avoid elevation Arm bike: level 1x 6 min (3/3)- PT present to discuss progress and to monitor for fatigue. Standing at mirror working on depressing scapula  Finger ladder: flexion with tactile cues to depress scapula 2x5 Seated cervical lateral flexion B 5 sec hold x 10 Seated cervical flexion 5 sec hold x 10 Shoulder extension and rows: red band 2x10 S/L flex 2x 10 in pain  free range - 2 pillows S/L ER 2x10 Supine ER x 10 with yellow band  Manual: PROM all flex, ABD, ER   10/21/23 STM and IASTM with blade in L S/L to R lats, then in supine to lat attachment, biceps and upper arm muscles Supine lat stretch knees 90/90 clasped hands and OH reach bent arms; feet on bolster for 90/90 position 3 x 10 (last set did with just R foot on bolster due to reports of back pain) PROM R shoudler flex and ER   PATIENT EDUCATION: Education details: [email or web address], over the door pulleys, scar mobs on Rt   Person educated: Patient Education method: Explanation, Demonstration, and Handouts Education comprehension: verbalized understanding and returned demonstration  HOME EXERCISE PROGRAM: Access Code: [email or web address]  URL: [email or web address]  Date: 10/28/2023  Prepared by: Burnard   Exercises  - Seated Shoulder Shrug Circles AROM Backward  - 5 x daily -  7 x weekly - 1 sets - 10 reps - Seated Shoulder Flexion Towel Slide at Table Top  - 2-3 x daily - 7 x weekly - 1 sets - 10 reps - Seated Elbow Flexion and Extension AROM  - 3 x daily - 7 x weekly - 1 sets - 10 reps - Supine Shoulder Flexion AAROM with Hands Clasped  - 1 x daily - 7 x weekly - 3 sets - 10 reps - Supine Shoulder External Rotation with Dowel  - 2 x daily - 7 x weekly - 1 sets - 10 reps - 10 hold - Isometric Shoulder Abduction at Wall  - 2 x daily - 7 x weekly - 2 sets - 5-10 reps - 5 hold - Standing Isometric Shoulder External Rotation with Doorway and Towel Roll  - 2 x daily - 7 x weekly - 2 sets - 5-10 reps - 5 hold - Sidelying Shoulder External Rotation AROM  - 1 x daily - 3 x weekly - 1-3 sets - 10 reps - Sidelying Shoulder Flexion 15 Degrees  - 1 x daily - 3-4 x weekly - 1-3 sets - 10 reps - 3 sec hold - Seated Gaze Stabilization with Head Rotation  - 3 x daily - 7 x weekly - Seated Gaze Stabilization with Head Nod  - 3 x daily - 7 x weekly     ASSESSMENT:  CLINICAL IMPRESSION:   Pt  reports still having intermittent dizziness and Rt shoulder pain from time to time. Today's session focused on treating both impairments, with trialing a series of cervical neck stretches and strengthening exercises followed by Epley's maneuver for canalith repositioning that pt responded well to. He appears to have nystagmus with head turned to the left, sitting up at the end of the maneuver stating, I feel normal now. Issued VOR exercises today. Plan to use Epley's for future visits as needed to treat vestibular symptoms. Patient will benefit from skilled PT to address the below impairments and improve overall function.    OBJECTIVE IMPAIRMENTS: decreased ROM, decreased strength, increased edema, increased fascial restrictions, impaired perceived functional ability, increased muscle spasms, impaired flexibility, impaired UE functional use, postural dysfunction, and pain.   ACTIVITY LIMITATIONS: carrying, lifting, dressing, self feeding, reach over head, and hygiene/grooming  PARTICIPATION LIMITATIONS: meal prep, cleaning, laundry, driving, community activity, and yard work  PERSONAL FACTORS: Age and 1 comorbidity: s/p Rt TSA are also affecting patient's functional outcome.   REHAB POTENTIAL: Good  CLINICAL DECISION MAKING: Stable/uncomplicated  EVALUATION COMPLEXITY: Low  GOALS: Goals reviewed with patient? Yes  SHORT TERM GOALS: Target date: 10/12/2023    Be independent in initial HEP Baseline: has initial HEP (09/23/23) Goal status: Met      Demonstrate Rt shoulder P/ROM flexion to > or = to 100 degrees to promote overhead reaching  Baseline:  Goal status: MET     Improve QuickDASH to < or = to 68% disability  Baseline: 79%  Goal status: MET   Wean from sling and report > or = to 30% use of Rt UE with ADLs and dressing  Baseline:  Goal status: MET 50%   Demonstrate Rt shoulder P/ROM ER to > or = to 45 degrees to improve reaching behind head  Baseline: 15 Goal status: IN  PROGRESS 10/14/23    LONG TERM GOALS: Target date: 11/09/2023    Be independent in advanced HEP Baseline:  Goal status: IN PROGRESS  2.  Improve QuickDASH to < or = to 50% disability  for use with functional tasks  Baseline: 79%  Goal status: MET 15.9 / 100 = 15.9 %    3.  Demonstrate Rt shoulder A/ROM flexion to > or = to 110 degrees for reaching overhead Baseline:  Goal status: IN PROGRESS  4.  Report > or = to 60% use of Rt UE with ADLs and self-care due to improve ROM and functional strength  Baseline:  Goal status: IN PROGRESS  5.  Demonstrate Rt UE A/ROM ER to occiput to aid with self-care Baseline:  Goal status: Progressing     PLAN: PT FREQUENCY: 2x/week  PT DURATION: 8 weeks  PLANNED INTERVENTIONS: 97110-Therapeutic exercises, 97530- Therapeutic activity, [id number]- Neuromuscular re-education, 97535- Self Care, [id number]- Manual therapy, [id number]- Canalith repositioning, [id number]- Aquatic Therapy, [id number]- Electrical stimulation (unattended), 20560 (1-2 muscles), 20561 (3+ muscles)- Dry Needling, Patient/Family education, Taping, Joint mobilization, Scar mobilization, Cryotherapy, and Moist heat  PLAN FOR NEXT SESSION: progress per protocol, continue with STM prn. Work on AROM as tolerated, continue Epley's treatment to left side as needed Abigail Jashan Cotten, SPT 10/28/23 5:13 PM  I agree with the following treatment note after reviewing documentation. This session was performed under the supervision of a licensed clinician.  Burnard Joy, PT 10/28/23 5:13 PM

## 2023-11-02 ENCOUNTER — Ambulatory Visit: Admitting: Physical Therapy

## 2023-11-02 ENCOUNTER — Encounter: Payer: Self-pay | Admitting: Physical Therapy

## 2023-11-02 DIAGNOSIS — R293 Abnormal posture: Secondary | ICD-10-CM | POA: Diagnosis not present

## 2023-11-02 DIAGNOSIS — M6281 Muscle weakness (generalized): Secondary | ICD-10-CM

## 2023-11-02 DIAGNOSIS — R252 Cramp and spasm: Secondary | ICD-10-CM | POA: Diagnosis not present

## 2023-11-02 DIAGNOSIS — M25511 Pain in right shoulder: Secondary | ICD-10-CM | POA: Diagnosis not present

## 2023-11-02 DIAGNOSIS — M25611 Stiffness of right shoulder, not elsewhere classified: Secondary | ICD-10-CM | POA: Diagnosis not present

## 2023-11-02 NOTE — Therapy (Signed)
 OUTPATIENT PHYSICAL THERAPY TREATMENT NOTE   Patient Name: Andrew Moran MRN: [id number] DOB:1945-12-02, 78 y.o., male Today's Date: 11/02/2023  END OF SESSION:  PT End of Session - 11/02/23 1450     Visit Number 15    Date for PT Re-Evaluation 11/09/23    Authorization Type [employer] Medicare-16 vl approved : 09/14/2023 - 11/09/2023; Re-auth pending (16 visits 8/26 to 10/21 requested)    Authorization - Visit Number 15   treatment charged the first visit   Authorization - Number of Visits 16    Progress Note Due on Visit 20    PT Start Time 1445    PT Stop Time 1533    PT Time Calculation (min) 48 min    Activity Tolerance Patient tolerated treatment well    Behavior During Therapy [facility] for tasks assessed/performed                     Past Medical History:  Diagnosis Date   Arthritis    Asthma    pulmology-- dr wert--- cough varient versus uacs   GERD (gastroesophageal reflux disease)    per pt takes DGL supplement   History of diverticulitis of colon 2002   managed medically , no surgical intervention   History of DVT of lower extremity 06/27/2019   followed by dr g. loni--  completed 3 months xarelto  07/ 2021   History of kidney stones    Renal calculi    left side   Past Surgical History:  Procedure Laterality Date   ANKLE HARDWARE REMOVAL Right 2008   CARPAL TUNNEL RELEASE Bilateral left 1983;  right 1984   CYSTOSCOPY WITH RETROGRADE PYELOGRAM, URETEROSCOPY AND STENT PLACEMENT Left 02/23/2020   Procedure: CYSTOSCOPY WITH RETROGRADE PYELOGRAM, URETEROSCOPY, LITHOTRIPSY, STONE EXTRACTION,  AND STENT PLACEMENT;  Surgeon: Matilda Senior, MD;  Location: [facility];  Service: Urology;  Laterality: Left;   CYSTOSCOPY/RETROGRADE/URETEROSCOPY/STONE EXTRACTION WITH BASKET Bilateral 10/10/2013   Procedure: CYSTOSCOPY/URETEROSCOPY/STONE EXTRACTION WITH HOLMIUM LASER, bilateral retrograde, bilateral ureteral stents;  Surgeon: Senior CHRISTELLA Matilda,  MD;  Location: [facility];  Service: Urology;  Laterality: Bilateral;   EXPLORATORY LAPAROTOMY  1970   EXPLORATORY STOMACH SURGERY FOR TEAR IN STOMACH WALL   EXTRACORPOREAL SHOCK WAVE LITHOTRIPSY Left 10/06/2019   Procedure: EXTRACORPOREAL SHOCK WAVE LITHOTRIPSY (ESWL);  Surgeon: Elisabeth Valli BIRCH, MD;  Location: [facility];  Service: Urology;  Laterality: Left;   EXTRACORPOREAL SHOCK WAVE LITHOTRIPSY  multiple since 2002,  last one 02-28-2013   HOLMIUM LASER APPLICATION Left 10/10/2013   Procedure: HOLMIUM LASER APPLICATION;  Surgeon: Senior CHRISTELLA Matilda, MD;  Location: [facility];  Service: Urology;  Laterality: Left;   KNEE ARTHROSCOPY  02/05/2011   Procedure: ARTHROSCOPY KNEE;  Surgeon: Lynwood SQUIBB [place];  Location: [facility];  Service: Orthopedics;  Laterality: Right;  Right knee arthroscopy partial lateral and medial meniscectomy with condyle debridement   LUMBAR SPINE SURGERY  x2  1995   ORIF ANKLE FRACTURE Right 1988   ROTATOR CUFF REPAIR Bilateral right 02/16/2018;  left 1997   TONSILLECTOMY AND ADENOIDECTOMY  1951   TOTAL KNEE ARTHROPLASTY Left 04/08/2019   Procedure: TOTAL KNEE ARTHROPLASTY;  Surgeon: Gerome Charleston, MD;  Location: [facility];  Service: Orthopedics;  Laterality: Left;  with adductor canal   Patient Active Problem List   Diagnosis Date Noted   Cellulitis 08/17/2023   Asthma exacerbation 04/22/2021   History of total knee arthroplasty 04/08/2019   DOE (dyspnea on exertion) 11/09/2018   Cough variant  asthma vs UACS  06/27/2016    PCP: Kip Righter, MD  REFERRING PROVIDER: Melita Drivers, MD  REFERRING DIAG:  Diagnosis  M25.511 (ICD-10-CM) - Pain in right shoulder  S/p Rt reverse total shoulder replacement   THERAPY DIAG:  Acute pain of right shoulder  Abnormal posture  Stiffness of right shoulder, not elsewhere classified  Muscle weakness (generalized)  Rationale for Evaluation and Treatment: Rehabilitation  ONSET DATE: surgery  08/11/23  SUBJECTIVE:                                                                                                                                                                                      SUBJECTIVE STATEMENT: Pt reports dizziness has been better until today. Much better than before. Patient 12 weeks post op 11/03/23  From eval: Pt presents to PT s/p Rt reverse total shoulder replacement performed 08/11/23.  [place] and had resultant torn rotator cuff and this lead to surgery. He is wearing sling just when out of the house.    Hand dominance: Right  PERTINENT HISTORY: Lumbar surgery x 2 in 1995, ORIF Rt ankle, TKA Lt 2021, fall in airport 06/2023 with concussion, rib fracture and Rt shoulder injury, Rt reverse TSA 08/11/23  PAIN:10/28/23 Are you having pain? Yes: NPRS scale: 0-3/10 at rest, up to/10 during movement Pain location: Rt shoulder  Pain description: stabbing, aching Aggravating factors: moving Rt UE into certain motions Relieving factors: pain meds, rest, ice  PRECAUTIONS: Other: follow post-op protocol  RED FLAGS: None   WEIGHT BEARING RESTRICTIONS: No  FALLS:  Has patient fallen in last 6 months? Yes. Number of falls prior to surgery- torn rotator cuff and labrum  LIVING ENVIRONMENT: Lives with: lives with their spouse Lives in: House/apartment Stairs: No Has following equipment at home: Single point cane    PLOF: Independent and Leisure: travel  PATIENT GOALS: return to prior level of function, use Rt UE   NEXT MD VISIT: 10/07/23  OBJECTIVE:  Note: Objective measures were completed at Evaluation unless otherwise noted.  DIAGNOSTIC FINDINGS:  NA  PATIENT SURVEYS :  Quick Dash Disability/Symptom Score:= 79.5% disability   10/19/23 Quick Dash  15.9 / 100 = 15.9 %  Minimally Clinically Important Difference (MCID): 15-20 points    COGNITION: Overall cognitive status: Within functional limits for tasks  assessed     SENSATION: WFL  POSTURE: Forward head/rounded shoulders   UPPER EXTREMITY ROM:  11/02/23: functional flexion and ABD 70 deg in sitting/standing, ER hand to behind ear, must tilt head, IR to back pocket,   Passive ROM Right eval Left eval Right  09/30/23 Right 10/19/23  Right 8/6 Right 11/02/23  Shoulder flexion 60 WFLs  105 109  117 120  Shoulder extension        Shoulder abduction 50  65 70  85  Shoulder adduction        Shoulder internal rotation 30       Shoulder external rotation 15  30 33 40 43  Elbow flexion        Elbow extension        Wrist flexion        Wrist extension        Wrist ulnar deviation        Wrist radial deviation        Wrist pronation        Wrist supination        (Blank rows = not tested)  UPPER EXTREMITY MMT: Not tested due to post-op restrictions  11/02/23 R shoulder flex/ABD 2+/5, ER 3-/5  JOINT MOBILITY TESTING:  NA- post op  PALPATION:  Healing surgical incision over Rt anterior aspect of shoulder.  Reduced mobility and tenderness                                                                                                                              TREATMENT DATE:   10/28/23 Arm bike level 1 x6 min (3/3)- PT present to discuss progress and to monitor for fatigue Goals assessed/re-auth form completed/ROM, MMT Supine OH lat strech with red band on wrists and legs in 90/90 on bolster x 10, then with dowel +2.5 # with prolonged hold x 1 min x 2 Rows and ext yellow band x 20 ea Red rows x 20  Red lat pull down x 20 Epley's maneuver -1 rep-for canalith repositioning for left sided nystagmus/mild dizziness  Reviewed briefly VOR x1 and x2 exercises for vestibular hypofunction   10/26/23 Seated with noodle along spine: scap retraction then retraction + ER isometric with yellow band 5 sec hold x 10 B cues to stand up straight; R shoulder ABD yellow band x 10; active flexion x 10 L cues avoid elevation Arm bike: level 1x 6 min  (3/3)- PT present to discuss progress and to monitor for fatigue. Standing at mirror working on depressing scapula  Finger ladder: flexion with tactile cues to depress scapula 2x5 Seated cervical lateral flexion B 5 sec hold x 10 Seated cervical flexion 5 sec hold x 10 Shoulder extension and rows: red band 2x10 S/L flex 2x 10 in pain free range - 2 pillows S/L ER 2x10 Supine ER x 10 with yellow band  Manual: PROM all flex, ABD, ER   10/21/23 STM and IASTM with blade in L S/L to R lats, then in supine to lat attachment, biceps and upper arm muscles Supine lat stretch knees 90/90 clasped hands and OH reach bent arms; feet on bolster for 90/90 position 3 x 10 (last set did with just R foot on bolster due to reports of  back pain) PROM R shoudler flex and ER   PATIENT EDUCATION: Education details: [email or web address], over the door pulleys, scar mobs on Rt   Person educated: Patient Education method: Explanation, Demonstration, and Handouts Education comprehension: verbalized understanding and returned demonstration  HOME EXERCISE PROGRAM: Access Code: [email or web address]  URL: [email or web address]  Date: 10/28/2023  Prepared by: Burnard   Exercises  - Seated Shoulder Shrug Circles AROM Backward  - 5 x daily - 7 x weekly - 1 sets - 10 reps - Seated Shoulder Flexion Towel Slide at Table Top  - 2-3 x daily - 7 x weekly - 1 sets - 10 reps - Seated Elbow Flexion and Extension AROM  - 3 x daily - 7 x weekly - 1 sets - 10 reps - Supine Shoulder Flexion AAROM with Hands Clasped  - 1 x daily - 7 x weekly - 3 sets - 10 reps - Supine Shoulder External Rotation with Dowel  - 2 x daily - 7 x weekly - 1 sets - 10 reps - 10 hold - Isometric Shoulder Abduction at Wall  - 2 x daily - 7 x weekly - 2 sets - 5-10 reps - 5 hold - Standing Isometric Shoulder External Rotation with Doorway and Towel Roll  - 2 x daily - 7 x weekly - 2 sets - 5-10 reps - 5 hold - Sidelying Shoulder External Rotation AROM  - 1 x  daily - 3 x weekly - 1-3 sets - 10 reps - Sidelying Shoulder Flexion 15 Degrees  - 1 x daily - 3-4 x weekly - 1-3 sets - 10 reps - 3 sec hold - Seated Gaze Stabilization with Head Rotation  - 3 x daily - 7 x weekly - Seated Gaze Stabilization with Head Nod  - 3 x daily - 7 x weekly     ASSESSMENT:  CLINICAL IMPRESSION: Patient reports improvement in shoulder pain except when lifting his arm. This has improved to where it is not stabbing pain anymore, but still rated at 7/10. He states he is able to put shirts on overhead and able to wash my hair, but pt cannot do these things without bending his neck or back. He demonstrates significant shoulder weakness in flexion, ABD and ER. He cannot lift his R arm above 70 degrees against gravity limiting all activities beyond this like reaching in a cupboard. He has ongoing ROM limitations in IR and ER resulting in difficulty drying his back with a towel. Patient has made good progress overall and feels he has 50% use of the right arm with ADLS. He continues to demonstrate potential for improvement and would benefit from continued skilled therapy to address impairments.  I recommend 2x/wk for 8 more weeks to meet strength and functional goals. Patient reported mild dizziness today and responded well to Epley maneuver.    OBJECTIVE IMPAIRMENTS: decreased ROM, decreased strength, increased edema, increased fascial restrictions, impaired perceived functional ability, increased muscle spasms, impaired flexibility, impaired UE functional use, postural dysfunction, and pain.   ACTIVITY LIMITATIONS: carrying, lifting, dressing, self feeding, reach over head, and hygiene/grooming  PARTICIPATION LIMITATIONS: meal prep, cleaning, laundry, driving, community activity, and yard work  PERSONAL FACTORS: Age and 1 comorbidity: s/p Rt TSA are also affecting patient's functional outcome.   REHAB POTENTIAL: Good  CLINICAL DECISION MAKING:  Stable/uncomplicated  EVALUATION COMPLEXITY: Low  GOALS: Goals reviewed with patient? Yes  SHORT TERM GOALS: Target date: 10/12/2023    Be independent in initial HEP Baseline: has initial HEP (09/23/23) Goal status:  Met      Demonstrate Rt shoulder P/ROM flexion to > or = to 100 degrees to promote overhead reaching  Baseline:  Goal status: MET     Improve QuickDASH to < or = to 68% disability  Baseline: 79%  Goal status: MET   Wean from sling and report > or = to 30% use of Rt UE with ADLs and dressing  Baseline:  Goal status: MET 50%   Demonstrate Rt shoulder P/ROM ER to > or = to 45 degrees to improve reaching behind head  Baseline: 15 Goal status: IN PROGRESS 10/14/23    LONG TERM GOALS: Target date: 11/09/2023    Be independent in advanced HEP Baseline:  Goal status: IN PROGRESS  2.  Improve QuickDASH to < or = to 50% disability for use with functional tasks  Baseline: 79%  Goal status: MET 15.9 / 100 = 15.9 %    3.  Demonstrate Rt shoulder A/ROM flexion to > or = to 110 degrees for reaching overhead Baseline:  Goal status: IN PROGRESS 11/02/23 70 degrees in sitting/standing (120 deg PROM)  4.  Report > or = to 60% use of Rt UE with ADLs and self-care due to improve ROM and functional strength  Baseline:  Goal status: IN PROGRESS  11/02/23  50%  5.  Demonstrate Rt UE A/ROM ER to occiput to aid with self-care Baseline:  Goal status: IN PROGRESS  11/02/23  to behind ear    PLAN: PT FREQUENCY: 2x/week  PT DURATION: 8 weeks  PLANNED INTERVENTIONS: 97110-Therapeutic exercises, 97530- Therapeutic activity, 97112- Neuromuscular re-education, 97535- Self Care, [id number]- Manual therapy, [phone]- Canalith repositioning, [id number]- Aquatic Therapy, [id number]- Electrical stimulation (unattended), 20560 (1-2 muscles), 20561 (3+ muscles)- Dry Needling, Patient/Family education, Taping, Joint mobilization, Scar mobilization, Cryotherapy, and Moist heat  PLAN FOR NEXT SESSION:  progress per protocol, work on strengthening for functional gains. continue Epley's treatment to left side as needed  Mliss Cummins, PT 11/02/23 5:38 PM

## 2023-11-03 NOTE — Therapy (Incomplete)
 OUTPATIENT PHYSICAL THERAPY TREATMENT NOTE    Patient Name: Andrew Moran MRN: [id number] DOB:1945-09-29, 78 y.o., male Today's Date: 11/03/2023  END OF SESSION:               Past Medical History:  Diagnosis Date   Arthritis    Asthma    pulmology-- dr wert--- cough varient versus uacs   GERD (gastroesophageal reflux disease)    per pt takes DGL supplement   History of diverticulitis of colon 2002   managed medically , no surgical intervention   History of DVT of lower extremity 06/27/2019   followed by dr g. loni--  completed 3 months xarelto  07/ 2021   History of kidney stones    Renal calculi    left side   Past Surgical History:  Procedure Laterality Date   ANKLE HARDWARE REMOVAL Right 2008   CARPAL TUNNEL RELEASE Bilateral left 1983;  right 1984   CYSTOSCOPY WITH RETROGRADE PYELOGRAM, URETEROSCOPY AND STENT PLACEMENT Left 02/23/2020   Procedure: CYSTOSCOPY WITH RETROGRADE PYELOGRAM, URETEROSCOPY, LITHOTRIPSY, STONE EXTRACTION,  AND STENT PLACEMENT;  Surgeon: Matilda Senior, MD;  Location: [facility] [place];  Service: Urology;  Laterality: Left;   CYSTOSCOPY/RETROGRADE/URETEROSCOPY/STONE EXTRACTION WITH BASKET Bilateral 10/10/2013   Procedure: CYSTOSCOPY/URETEROSCOPY/STONE EXTRACTION WITH HOLMIUM LASER, bilateral retrograde, bilateral ureteral stents;  Surgeon: Senior CHRISTELLA Matilda, MD;  Location: [facility];  Service: Urology;  Laterality: Bilateral;   EXPLORATORY LAPAROTOMY  1970   EXPLORATORY STOMACH SURGERY FOR TEAR IN STOMACH WALL   EXTRACORPOREAL SHOCK WAVE LITHOTRIPSY Left 10/06/2019   Procedure: EXTRACORPOREAL SHOCK WAVE LITHOTRIPSY (ESWL);  Surgeon: Elisabeth Valli BIRCH, MD;  Location: [facility];  Service: Urology;  Laterality: Left;   EXTRACORPOREAL SHOCK WAVE LITHOTRIPSY  multiple since 2002,  last one 02-28-2013   HOLMIUM LASER APPLICATION Left 10/10/2013   Procedure: HOLMIUM LASER APPLICATION;  Surgeon: Senior CHRISTELLA Matilda,  MD;  Location: [facility];  Service: Urology;  Laterality: Left;   KNEE ARTHROSCOPY  02/05/2011   Procedure: ARTHROSCOPY KNEE;  Surgeon: Lynwood SQUIBB [place];  Location: [facility];  Service: Orthopedics;  Laterality: Right;  Right knee arthroscopy partial lateral and medial meniscectomy with condyle debridement   LUMBAR SPINE SURGERY  x2  1995   ORIF ANKLE FRACTURE Right 1988   ROTATOR CUFF REPAIR Bilateral right 02/16/2018;  left 1997   TONSILLECTOMY AND ADENOIDECTOMY  1951   TOTAL KNEE ARTHROPLASTY Left 04/08/2019   Procedure: TOTAL KNEE ARTHROPLASTY;  Surgeon: Gerome Charleston, MD;  Location: [facility];  Service: Orthopedics;  Laterality: Left;  with adductor canal   Patient Active Problem List   Diagnosis Date Noted   Cellulitis 08/17/2023   Asthma exacerbation 04/22/2021   History of total knee arthroplasty 04/08/2019   DOE (dyspnea on exertion) 11/09/2018   Cough variant asthma vs UACS  06/27/2016    PCP: Kip Righter, MD  REFERRING PROVIDER: Melita Drivers, MD  REFERRING DIAG:  Diagnosis  M25.511 (ICD-10-CM) - Pain in right shoulder  S/p Rt reverse total shoulder replacement   THERAPY DIAG:  No diagnosis found.  Rationale for Evaluation and Treatment: Rehabilitation  ONSET DATE: surgery 08/11/23  SUBJECTIVE:  SUBJECTIVE STATEMENT: *** Patient 12 weeks post op 11/03/23  From eval: Pt presents to PT s/p Rt reverse total shoulder replacement performed 08/11/23.  [place] and had resultant torn rotator cuff and this lead to surgery. He is wearing sling just when out of the house.    Hand dominance: Right  PERTINENT HISTORY: Lumbar surgery x 2 in 1995, ORIF Rt ankle, TKA Lt 2021, fall in airport 06/2023 with concussion, rib fracture and Rt shoulder injury, Rt reverse TSA  08/11/23  PAIN:10/28/23 Are you having pain? Yes: NPRS scale: 0-3/10 at rest, up to/10 during movement Pain location: Rt shoulder  Pain description: stabbing, aching Aggravating factors: moving Rt UE into certain motions Relieving factors: pain meds, rest, ice  PRECAUTIONS: Other: follow post-op protocol  RED FLAGS: None   WEIGHT BEARING RESTRICTIONS: No  FALLS:  Has patient fallen in last 6 months? Yes. Number of falls prior to surgery- torn rotator cuff and labrum  LIVING ENVIRONMENT: Lives with: lives with their spouse Lives in: House/apartment Stairs: No Has following equipment at home: Single point cane    PLOF: Independent and Leisure: travel  PATIENT GOALS: return to prior level of function, use Rt UE   NEXT MD VISIT: 10/07/23  OBJECTIVE:  Note: Objective measures were completed at Evaluation unless otherwise noted.  DIAGNOSTIC FINDINGS:  NA  PATIENT SURVEYS :  Quick Dash Disability/Symptom Score:= 79.5% disability   10/19/23 Quick Dash  15.9 / 100 = 15.9 %  Minimally Clinically Important Difference (MCID): 15-20 points    COGNITION: Overall cognitive status: Within functional limits for tasks assessed     SENSATION: WFL  POSTURE: Forward head/rounded shoulders   UPPER EXTREMITY ROM:  11/02/23: functional flexion and ABD 70 deg in sitting/standing, ER hand to behind ear, must tilt head, IR to back pocket,   Passive ROM Right eval Left eval Right  09/30/23 Right 10/19/23 Right 8/6 Right 11/02/23  Shoulder flexion 60 WFLs  105 109  117 120  Shoulder extension        Shoulder abduction 50  65 70  85  Shoulder adduction        Shoulder internal rotation 30       Shoulder external rotation 15  30 33 40 43  Elbow flexion        Elbow extension        Wrist flexion        Wrist extension        Wrist ulnar deviation        Wrist radial deviation        Wrist pronation        Wrist supination        (Blank rows = not tested)  UPPER EXTREMITY  MMT: Not tested due to post-op restrictions  11/02/23 R shoulder flex/ABD 2+/5, ER 3-/5  JOINT MOBILITY TESTING:  NA- post op  PALPATION:  Healing surgical incision over Rt anterior aspect of shoulder.  Reduced mobility and tenderness  TREATMENT DATE:  11/04/23  *** DN?? Arm bike level 1 x6 min (3/3)- PT present to discuss progress and to monitor for fatigue Goals assessed/re-auth form completed/ROM, MMT Supine OH lat strech with red band on wrists and legs in 90/90 on bolster x 10, then with dowel +2.5 # with prolonged hold x 1 min x 2 Rows and ext yellow band x 20 ea Red rows x 20  Red lat pull down x 20    10/28/23 Arm bike level 1 x6 min (3/3)- PT present to discuss progress and to monitor for fatigue Goals assessed/re-auth form completed/ROM, MMT Supine OH lat strech with red band on wrists and legs in 90/90 on bolster x 10, then with dowel +2.5 # with prolonged hold x 1 min x 2 Rows and ext yellow band x 20 ea Red rows x 20  Red lat pull down x 20 Epley's maneuver -1 rep-for canalith repositioning for left sided nystagmus/mild dizziness  Reviewed briefly VOR x1 and x2 exercises for vestibular hypofunction   10/26/23 Seated with noodle along spine: scap retraction then retraction + ER isometric with yellow band 5 sec hold x 10 B cues to stand up straight; R shoulder ABD yellow band x 10; active flexion x 10 L cues avoid elevation Arm bike: level 1x 6 min (3/3)- PT present to discuss progress and to monitor for fatigue. Standing at mirror working on depressing scapula  Finger ladder: flexion with tactile cues to depress scapula 2x5 Seated cervical lateral flexion B 5 sec hold x 10 Seated cervical flexion 5 sec hold x 10 Shoulder extension and rows: red band 2x10 S/L flex 2x 10 in pain free range - 2 pillows S/L ER 2x10 Supine ER x 10 with yellow  band  Manual: PROM all flex, ABD, ER   10/21/23 STM and IASTM with blade in L S/L to R lats, then in supine to lat attachment, biceps and upper arm muscles Supine lat stretch knees 90/90 clasped hands and OH reach bent arms; feet on bolster for 90/90 position 3 x 10 (last set did with just R foot on bolster due to reports of back pain) PROM R shoudler flex and ER   PATIENT EDUCATION: Education details: [email or web address], over the door pulleys, scar mobs on Rt   Person educated: Patient Education method: Explanation, Demonstration, and Handouts Education comprehension: verbalized understanding and returned demonstration  HOME EXERCISE PROGRAM: Access Code: [email or web address]  URL: [email or web address]  Date: 10/28/2023  Prepared by: Burnard   Exercises  - Seated Shoulder Shrug Circles AROM Backward  - 5 x daily - 7 x weekly - 1 sets - 10 reps - Seated Shoulder Flexion Towel Slide at Table Top  - 2-3 x daily - 7 x weekly - 1 sets - 10 reps - Seated Elbow Flexion and Extension AROM  - 3 x daily - 7 x weekly - 1 sets - 10 reps - Supine Shoulder Flexion AAROM with Hands Clasped  - 1 x daily - 7 x weekly - 3 sets - 10 reps - Supine Shoulder External Rotation with Dowel  - 2 x daily - 7 x weekly - 1 sets - 10 reps - 10 hold - Isometric Shoulder Abduction at Wall  - 2 x daily - 7 x weekly - 2 sets - 5-10 reps - 5 hold - Standing Isometric Shoulder External Rotation with Doorway and Towel Roll  - 2 x daily - 7 x weekly - 2 sets - 5-10 reps - 5 hold - Sidelying  Shoulder External Rotation AROM  - 1 x daily - 3 x weekly - 1-3 sets - 10 reps - Sidelying Shoulder Flexion 15 Degrees  - 1 x daily - 3-4 x weekly - 1-3 sets - 10 reps - 3 sec hold - Seated Gaze Stabilization with Head Rotation  - 3 x daily - 7 x weekly - Seated Gaze Stabilization with Head Nod  - 3 x daily - 7 x weekly     ASSESSMENT:  CLINICAL IMPRESSION: Patient reports improvement in shoulder pain except when lifting his arm.  This has improved to where it is not stabbing pain anymore, but still rated at 7/10. He states he is able to put shirts on overhead and able to wash my hair, but pt cannot do these things without bending his neck or back. He demonstrates significant shoulder weakness in flexion, ABD and ER. He cannot lift his R arm above 70 degrees against gravity limiting all activities beyond this like reaching in a cupboard. He has ongoing ROM limitations in IR and ER resulting in difficulty drying his back with a towel. Patient has made good progress overall and feels he has 50% use of the right arm with ADLS. He continues to demonstrate potential for improvement and would benefit from continued skilled therapy to address impairments.  I recommend 2x/wk for 8 more weeks to meet strength and functional goals. Patient reported mild dizziness today and responded well to Epley maneuver.    OBJECTIVE IMPAIRMENTS: decreased ROM, decreased strength, increased edema, increased fascial restrictions, impaired perceived functional ability, increased muscle spasms, impaired flexibility, impaired UE functional use, postural dysfunction, and pain.   ACTIVITY LIMITATIONS: carrying, lifting, dressing, self feeding, reach over head, and hygiene/grooming  PARTICIPATION LIMITATIONS: meal prep, cleaning, laundry, driving, community activity, and yard work  PERSONAL FACTORS: Age and 1 comorbidity: s/p Rt TSA are also affecting patient's functional outcome.   REHAB POTENTIAL: Good  CLINICAL DECISION MAKING: Stable/uncomplicated  EVALUATION COMPLEXITY: Low  GOALS: Goals reviewed with patient? Yes  SHORT TERM GOALS: Target date: 10/12/2023    Be independent in initial HEP Baseline: has initial HEP (09/23/23) Goal status: Met      Demonstrate Rt shoulder P/ROM flexion to > or = to 100 degrees to promote overhead reaching  Baseline:  Goal status: MET     Improve QuickDASH to < or = to 68% disability  Baseline: 79%  Goal  status: MET   Wean from sling and report > or = to 30% use of Rt UE with ADLs and dressing  Baseline:  Goal status: MET 50%   Demonstrate Rt shoulder P/ROM ER to > or = to 45 degrees to improve reaching behind head  Baseline: 15 Goal status: IN PROGRESS 10/14/23    LONG TERM GOALS: Target date: 01/05/2024    Be independent in advanced HEP Baseline:  Goal status: IN PROGRESS  2.  Improve QuickDASH to < or = to 50% disability for use with functional tasks  Baseline: 79%  Goal status: MET 15.9 / 100 = 15.9 %    3.  Demonstrate Rt shoulder A/ROM flexion to > or = to 110 degrees for reaching overhead Baseline:  Goal status: IN PROGRESS 11/02/23 70 degrees in sitting/standing (120 deg PROM)  4.  Report > or = to 60% use of Rt UE with ADLs and self-care due to improve ROM and functional strength  Baseline:  Goal status: IN PROGRESS  11/02/23  50%  5.  Demonstrate Rt UE A/ROM ER to  occiput to aid with self-care Baseline:  Goal status: IN PROGRESS  11/02/23  to behind ear    PLAN: PT FREQUENCY: 2x/week  PT DURATION: 8 weeks  PLANNED INTERVENTIONS: 97110-Therapeutic exercises, 97530- Therapeutic activity, 97112- Neuromuscular re-education, 97535- Self Care, [id number]- Manual therapy, [phone]- Canalith repositioning, [id number]- Aquatic Therapy, [id number]- Electrical stimulation (unattended), 20560 (1-2 muscles), 20561 (3+ muscles)- Dry Needling, Patient/Family education, Taping, Joint mobilization, Scar mobilization, Cryotherapy, and Moist heat  PLAN FOR NEXT SESSION: progress per protocol, work on strengthening for functional gains. continue Epley's treatment to left side as needed  Mliss Cummins, PT 11/03/23 9:15 PM

## 2023-11-03 NOTE — Addendum Note (Signed)
 Addended by: Lorilyn Laitinen J on: 11/03/2023 11:23 AM   Modules accepted: Orders

## 2023-11-04 ENCOUNTER — Ambulatory Visit: Admitting: Physical Therapy

## 2023-11-04 DIAGNOSIS — M6281 Muscle weakness (generalized): Secondary | ICD-10-CM

## 2023-11-04 DIAGNOSIS — R293 Abnormal posture: Secondary | ICD-10-CM

## 2023-11-04 DIAGNOSIS — R252 Cramp and spasm: Secondary | ICD-10-CM

## 2023-11-04 DIAGNOSIS — M25511 Pain in right shoulder: Secondary | ICD-10-CM | POA: Diagnosis not present

## 2023-11-04 DIAGNOSIS — M25611 Stiffness of right shoulder, not elsewhere classified: Secondary | ICD-10-CM

## 2023-11-04 NOTE — Therapy (Signed)
 OUTPATIENT PHYSICAL THERAPY TREATMENT NOTE    Patient Name: Andrew Moran MRN: [id number] DOB:September 04, 1945, 78 y.o., male Today's Date: 11/04/2023  END OF SESSION:  PT End of Session - 11/04/23 1240     Visit Number 16    Date for PT Re-Evaluation 01/05/24    Authorization Type [employer] Medicare-16 vl approved : 09/14/2023 - 11/09/2023; Re-auth pending (16 visits 8/26 to 10/21 requested)    Authorization Time Period Submitted for more auth on 11/03/23    Authorization - Visit Number 16    Authorization - Number of Visits 16    Progress Note Due on Visit 20    PT Start Time 1233    PT Stop Time 1317    PT Time Calculation (min) 44 min    Activity Tolerance Patient tolerated treatment well    Behavior During Therapy WFL for tasks assessed/performed           Past Medical History:  Diagnosis Date   Arthritis    Asthma    pulmology-- dr wert--- cough varient versus uacs   GERD (gastroesophageal reflux disease)    per pt takes DGL supplement   History of diverticulitis of colon 2002   managed medically , no surgical intervention   History of DVT of lower extremity 06/27/2019   followed by dr g. loni--  completed 3 months xarelto  07/ 2021   History of kidney stones    Renal calculi    left side   Past Surgical History:  Procedure Laterality Date   ANKLE HARDWARE REMOVAL Right 2008   CARPAL TUNNEL RELEASE Bilateral left 1983;  right 1984   CYSTOSCOPY WITH RETROGRADE PYELOGRAM, URETEROSCOPY AND STENT PLACEMENT Left 02/23/2020   Procedure: CYSTOSCOPY WITH RETROGRADE PYELOGRAM, URETEROSCOPY, LITHOTRIPSY, STONE EXTRACTION,  AND STENT PLACEMENT;  Surgeon: Matilda Senior, MD;  Location: [facility];  Service: Urology;  Laterality: Left;   CYSTOSCOPY/RETROGRADE/URETEROSCOPY/STONE EXTRACTION WITH BASKET Bilateral 10/10/2013   Procedure: CYSTOSCOPY/URETEROSCOPY/STONE EXTRACTION WITH HOLMIUM LASER, bilateral retrograde, bilateral ureteral stents;  Surgeon: Senior CHRISTELLA Matilda, MD;  Location: [facility];  Service: Urology;  Laterality: Bilateral;   EXPLORATORY LAPAROTOMY  1970   EXPLORATORY STOMACH SURGERY FOR TEAR IN STOMACH WALL   EXTRACORPOREAL SHOCK WAVE LITHOTRIPSY Left 10/06/2019   Procedure: EXTRACORPOREAL SHOCK WAVE LITHOTRIPSY (ESWL);  Surgeon: Elisabeth Valli BIRCH, MD;  Location: [facility];  Service: Urology;  Laterality: Left;   EXTRACORPOREAL SHOCK WAVE LITHOTRIPSY  multiple since 2002,  last one 02-28-2013   HOLMIUM LASER APPLICATION Left 10/10/2013   Procedure: HOLMIUM LASER APPLICATION;  Surgeon: Senior CHRISTELLA Matilda, MD;  Location: [facility];  Service: Urology;  Laterality: Left;   KNEE ARTHROSCOPY  02/05/2011   Procedure: ARTHROSCOPY KNEE;  Surgeon: Lynwood SQUIBB [place];  Location: [facility];  Service: Orthopedics;  Laterality: Right;  Right knee arthroscopy partial lateral and medial meniscectomy with condyle debridement   LUMBAR SPINE SURGERY  x2  1995   ORIF ANKLE FRACTURE Right 1988   ROTATOR CUFF REPAIR Bilateral right 02/16/2018;  left 1997   TONSILLECTOMY AND ADENOIDECTOMY  1951   TOTAL KNEE ARTHROPLASTY Left 04/08/2019   Procedure: TOTAL KNEE ARTHROPLASTY;  Surgeon: Gerome Charleston, MD;  Location: [facility];  Service: Orthopedics;  Laterality: Left;  with adductor canal   Patient Active Problem List   Diagnosis Date Noted   Cellulitis 08/17/2023   Asthma exacerbation 04/22/2021   History of total knee arthroplasty 04/08/2019   DOE (dyspnea on exertion) 11/09/2018   Cough variant asthma vs UACS  06/27/2016    PCP: Kip Righter, MD  REFERRING PROVIDER: Melita Drivers, MD  REFERRING DIAG:  Diagnosis  M25.511 (ICD-10-CM) - Pain in right shoulder  S/p Rt reverse total shoulder replacement   THERAPY DIAG:  Acute pain of right shoulder  Stiffness of right shoulder, not elsewhere classified  Muscle weakness (generalized)  Cramp and spasm  Abnormal posture  Rationale for Evaluation and Treatment: Rehabilitation  ONSET  DATE: surgery 08/11/23  SUBJECTIVE:                                                                                                                                                                                      SUBJECTIVE STATEMENT: Pt reports not much pain but still tight and feeling discomfort in the front of my shoulder with certain movements  From eval: Pt presents to PT s/p Rt reverse total shoulder replacement performed 08/11/23.  [place] and had resultant torn rotator cuff and this lead to surgery. He is wearing sling just when out of the house.    Hand dominance: Right  PERTINENT HISTORY: Lumbar surgery x 2 in 1995, ORIF Rt ankle, TKA Lt 2021, fall in airport 06/2023 with concussion, rib fracture and Rt shoulder injury, Rt reverse TSA 08/11/23  PAIN:10/28/23 Are you having pain? Yes: NPRS scale: 0-3/10 at rest, up to/10 during movement Pain location: Rt shoulder  Pain description: stabbing, aching Aggravating factors: moving Rt UE into certain motions Relieving factors: pain meds, rest, ice  PRECAUTIONS: Other: follow post-op protocol  RED FLAGS: None   WEIGHT BEARING RESTRICTIONS: No  FALLS:  Has patient fallen in last 6 months? Yes. Number of falls prior to surgery- torn rotator cuff and labrum  LIVING ENVIRONMENT: Lives with: lives with their spouse Lives in: House/apartment Stairs: No Has following equipment at home: Single point cane    PLOF: Independent and Leisure: travel  PATIENT GOALS: return to prior level of function, use Rt UE   NEXT MD VISIT: 10/07/23  OBJECTIVE:  Note: Objective measures were completed at Evaluation unless otherwise noted.  DIAGNOSTIC FINDINGS:  NA  PATIENT SURVEYS :  Quick Dash Disability/Symptom Score:= 79.5% disability   10/19/23 Quick Dash  15.9 / 100 = 15.9 %  Minimally Clinically Important Difference (MCID): 15-20 points    COGNITION: Overall cognitive status: Within functional  limits for tasks assessed     SENSATION: WFL  POSTURE: Forward head/rounded shoulders   UPPER EXTREMITY ROM:  11/02/23: functional flexion and ABD 70 deg in sitting/standing, ER hand to behind ear, must tilt head, IR to back pocket,   Passive ROM Right eval Left eval Right  09/30/23  Right 10/19/23 Right 8/6 Right 11/02/23  Shoulder flexion 60 WFLs  105 109  117 120  Shoulder extension        Shoulder abduction 50  65 70  85  Shoulder adduction        Shoulder internal rotation 30       Shoulder external rotation 15  30 33 40 43  Elbow flexion        Elbow extension        Wrist flexion        Wrist extension        Wrist ulnar deviation        Wrist radial deviation        Wrist pronation        Wrist supination        (Blank rows = not tested)  UPPER EXTREMITY MMT: Not tested due to post-op restrictions  11/02/23 R shoulder flex/ABD 2+/5, ER 3-/5  JOINT MOBILITY TESTING:  NA- post op  PALPATION:  Healing surgical incision over Rt anterior aspect of shoulder.  Reduced mobility and tenderness                                                                                                                              TREATMENT DATE:  11/04/23 UBE x 6 min (3/3) Seated pulleys: flexion and scaption x 2 min each Standing fwd bent shoulder extension 2 x 10 with 2 lbs Standing fwd bent shoulder row 2 x 10 with 2 lbs Standing fwd bent horizontal abduction with 2 lbs 2 x 10 Seated bilateral shoulder ER 2 x 10 with yellow band Seated bilateral shouler horizontal abduction with yellow band 2 x 10 Supine (with head elevated) AA shoulder flexion 2 x 10 using his cane Supine AA shoulder abduction using cane Attempted supine active fwd flexion but patient unable PROM all planes of motion, right shoulder  10/28/23 Arm bike level 1 x6 min (3/3)- PT present to discuss progress and to monitor for fatigue Goals assessed/re-auth form completed/ROM, MMT Supine OH lat strech with red  band on wrists and legs in 90/90 on bolster x 10, then with dowel +2.5 # with prolonged hold x 1 min x 2 Rows and ext yellow band x 20 ea Red rows x 20  Red lat pull down x 20 Epley's maneuver -1 rep-for canalith repositioning for left sided nystagmus/mild dizziness  Reviewed briefly VOR x1 and x2 exercises for vestibular hypofunction   10/26/23 Seated with noodle along spine: scap retraction then retraction + ER isometric with yellow band 5 sec hold x 10 B cues to stand up straight; R shoulder ABD yellow band x 10; active flexion x 10 L cues avoid elevation Arm bike: level 1x 6 min (3/3)- PT present to discuss progress and to monitor for fatigue. Standing at mirror working on depressing scapula  Finger ladder: flexion with tactile cues to depress scapula 2x5 Seated cervical lateral flexion B 5 sec  hold x 10 Seated cervical flexion 5 sec hold x 10 Shoulder extension and rows: red band 2x10 S/L flex 2x 10 in pain free range - 2 pillows S/L ER 2x10 Supine ER x 10 with yellow band  Manual: PROM all flex, ABD, ER  PATIENT EDUCATION: Education details: [email or web address], over the door pulleys, scar mobs on Rt   Person educated: Patient Education method: Explanation, Demonstration, and Handouts Education comprehension: verbalized understanding and returned demonstration  HOME EXERCISE PROGRAM: Access Code: [email or web address]  URL: [email or web address]  Date: 10/28/2023  Prepared by: Burnard   Exercises  - Seated Shoulder Shrug Circles AROM Backward  - 5 x daily - 7 x weekly - 1 sets - 10 reps - Seated Shoulder Flexion Towel Slide at Table Top  - 2-3 x daily - 7 x weekly - 1 sets - 10 reps - Seated Elbow Flexion and Extension AROM  - 3 x daily - 7 x weekly - 1 sets - 10 reps - Supine Shoulder Flexion AAROM with Hands Clasped  - 1 x daily - 7 x weekly - 3 sets - 10 reps - Supine Shoulder External Rotation with Dowel  - 2 x daily - 7 x weekly - 1 sets - 10 reps - 10 hold - Isometric  Shoulder Abduction at Wall  - 2 x daily - 7 x weekly - 2 sets - 5-10 reps - 5 hold - Standing Isometric Shoulder External Rotation with Doorway and Towel Roll  - 2 x daily - 7 x weekly - 2 sets - 5-10 reps - 5 hold - Sidelying Shoulder External Rotation AROM  - 1 x daily - 3 x weekly - 1-3 sets - 10 reps - Sidelying Shoulder Flexion 15 Degrees  - 1 x daily - 3-4 x weekly - 1-3 sets - 10 reps - 3 sec hold - Seated Gaze Stabilization with Head Rotation  - 3 x daily - 7 x weekly - Seated Gaze Stabilization with Head Nod  - 3 x daily - 7 x weekly     ASSESSMENT:  CLINICAL IMPRESSION: Charlena continues to have significant restriction in right shoulder ROM.  He has kyphotic posture with rounded shoulders which are likely contributing to this.  He indicates most of his pain is at the anterior shoulder.  We focused on rotator cuff and functional strengthening today, as well as thoracic and postural strengthening.  He would benefit from continued skilled PT to address ROM and functional strength deficits.     OBJECTIVE IMPAIRMENTS: decreased ROM, decreased strength, increased edema, increased fascial restrictions, impaired perceived functional ability, increased muscle spasms, impaired flexibility, impaired UE functional use, postural dysfunction, and pain.   ACTIVITY LIMITATIONS: carrying, lifting, dressing, self feeding, reach over head, and hygiene/grooming  PARTICIPATION LIMITATIONS: meal prep, cleaning, laundry, driving, community activity, and yard work  PERSONAL FACTORS: Age and 1 comorbidity: s/p Rt TSA are also affecting patient's functional outcome.   REHAB POTENTIAL: Good  CLINICAL DECISION MAKING: Stable/uncomplicated  EVALUATION COMPLEXITY: Low  GOALS: Goals reviewed with patient? Yes  SHORT TERM GOALS: Target date: 10/12/2023    Be independent in initial HEP Baseline: has initial HEP (09/23/23) Goal status: Met      Demonstrate Rt shoulder P/ROM flexion to > or = to 100 degrees  to promote overhead reaching  Baseline:  Goal status: MET     Improve QuickDASH to < or = to 68% disability  Baseline: 79%  Goal status: MET   Wean from sling and report > or =  to 30% use of Rt UE with ADLs and dressing  Baseline:  Goal status: MET 50%   Demonstrate Rt shoulder P/ROM ER to > or = to 45 degrees to improve reaching behind head  Baseline: 15 Goal status: IN PROGRESS 10/14/23    LONG TERM GOALS: Target date: 01/05/2024    Be independent in advanced HEP Baseline:  Goal status: IN PROGRESS  2.  Improve QuickDASH to < or = to 50% disability for use with functional tasks  Baseline: 79%  Goal status: MET 15.9 / 100 = 15.9 %    3.  Demonstrate Rt shoulder A/ROM flexion to > or = to 110 degrees for reaching overhead Baseline:  Goal status: IN PROGRESS 11/02/23 70 degrees in sitting/standing (120 deg PROM)  4.  Report > or = to 60% use of Rt UE with ADLs and self-care due to improve ROM and functional strength  Baseline:  Goal status: IN PROGRESS  11/02/23  50%  5.  Demonstrate Rt UE A/ROM ER to occiput to aid with self-care Baseline:  Goal status: IN PROGRESS  11/02/23  to behind ear    PLAN: PT FREQUENCY: 2x/week  PT DURATION: 8 weeks  PLANNED INTERVENTIONS: 97110-Therapeutic exercises, 97530- Therapeutic activity, 97112- Neuromuscular re-education, 97535- Self Care, [id number]- Manual therapy, [phone]- Canalith repositioning, [id number]- Aquatic Therapy, [id number]- Electrical stimulation (unattended), 20560 (1-2 muscles), 20561 (3+ muscles)- Dry Needling, Patient/Family education, Taping, Joint mobilization, Scar mobilization, Cryotherapy, and Moist heat  PLAN FOR NEXT SESSION: progress per protocol, postural strengthening, work on strengthening for functional gains. continue Epley's treatment to left side as needed  Delon B. Carmelo Reidel, PT 11/04/23 7:49 PM [facility] [address], [address] [place], [place] [address] Phone # [phone] Fax  [phone]

## 2023-11-08 NOTE — Therapy (Signed)
 OUTPATIENT PHYSICAL THERAPY TREATMENT NOTE    Patient Name: Andrew Moran MRN: [id number] DOB:06-Oct-1945, 78 y.o., male Today's Date: 11/09/2023  END OF SESSION:  PT End of Session - 11/09/23 1441     Visit Number 17    Number of Visits 24    Date for PT Re-Evaluation 01/05/24    Authorization Type [employer] Medicare 8 visits approved    Authorization Time Period 8/25 to 9/22    Authorization - Visit Number 1    Authorization - Number of Visits 8    Progress Note Due on Visit 25    PT Start Time 1444    PT Stop Time 1530    PT Time Calculation (min) 46 min            Past Medical History:  Diagnosis Date   Arthritis    Asthma    pulmology-- dr wert--- cough varient versus uacs   GERD (gastroesophageal reflux disease)    per pt takes DGL supplement   History of diverticulitis of colon 2002   managed medically , no surgical intervention   History of DVT of lower extremity 06/27/2019   followed by dr g. loni--  completed 3 months xarelto  07/ 2021   History of kidney stones    Renal calculi    left side   Past Surgical History:  Procedure Laterality Date   ANKLE HARDWARE REMOVAL Right 2008   CARPAL TUNNEL RELEASE Bilateral left 1983;  right 1984   CYSTOSCOPY WITH RETROGRADE PYELOGRAM, URETEROSCOPY AND STENT PLACEMENT Left 02/23/2020   Procedure: CYSTOSCOPY WITH RETROGRADE PYELOGRAM, URETEROSCOPY, LITHOTRIPSY, STONE EXTRACTION,  AND STENT PLACEMENT;  Surgeon: Matilda Senior, MD;  Location: [facility];  Service: Urology;  Laterality: Left;   CYSTOSCOPY/RETROGRADE/URETEROSCOPY/STONE EXTRACTION WITH BASKET Bilateral 10/10/2013   Procedure: CYSTOSCOPY/URETEROSCOPY/STONE EXTRACTION WITH HOLMIUM LASER, bilateral retrograde, bilateral ureteral stents;  Surgeon: Senior CHRISTELLA Matilda, MD;  Location: [facility];  Service: Urology;  Laterality: Bilateral;   EXPLORATORY LAPAROTOMY  1970   EXPLORATORY STOMACH SURGERY FOR TEAR IN STOMACH WALL   EXTRACORPOREAL SHOCK WAVE  LITHOTRIPSY Left 10/06/2019   Procedure: EXTRACORPOREAL SHOCK WAVE LITHOTRIPSY (ESWL);  Surgeon: Elisabeth Valli BIRCH, MD;  Location: [facility];  Service: Urology;  Laterality: Left;   EXTRACORPOREAL SHOCK WAVE LITHOTRIPSY  multiple since 2002,  last one 02-28-2013   HOLMIUM LASER APPLICATION Left 10/10/2013   Procedure: HOLMIUM LASER APPLICATION;  Surgeon: Senior CHRISTELLA Matilda, MD;  Location: [facility];  Service: Urology;  Laterality: Left;   KNEE ARTHROSCOPY  02/05/2011   Procedure: ARTHROSCOPY KNEE;  Surgeon: Lynwood SQUIBB [place];  Location: [facility];  Service: Orthopedics;  Laterality: Right;  Right knee arthroscopy partial lateral and medial meniscectomy with condyle debridement   LUMBAR SPINE SURGERY  x2  1995   ORIF ANKLE FRACTURE Right 1988   ROTATOR CUFF REPAIR Bilateral right 02/16/2018;  left 1997   TONSILLECTOMY AND ADENOIDECTOMY  1951   TOTAL KNEE ARTHROPLASTY Left 04/08/2019   Procedure: TOTAL KNEE ARTHROPLASTY;  Surgeon: Gerome Charleston, MD;  Location: [facility];  Service: Orthopedics;  Laterality: Left;  with adductor canal   Patient Active Problem List   Diagnosis Date Noted   Cellulitis 08/17/2023   Asthma exacerbation 04/22/2021   History of total knee arthroplasty 04/08/2019   DOE (dyspnea on exertion) 11/09/2018   Cough variant asthma vs UACS  06/27/2016    PCP: Kip Righter, MD  REFERRING PROVIDER: Melita Drivers, MD  REFERRING DIAG:  Diagnosis  M25.511 (ICD-10-CM) - Pain  in right shoulder  S/p Rt reverse total shoulder replacement   THERAPY DIAG:  Acute pain of right shoulder  Stiffness of right shoulder, not elsewhere classified  Muscle weakness (generalized)  Cramp and spasm  Abnormal posture  Rationale for Evaluation and Treatment: Rehabilitation  ONSET DATE: surgery 08/11/23  SUBJECTIVE:                                                                                                                                                                                       SUBJECTIVE STATEMENT: Every week it gets a little bit better. Stabbing pain is not as bad as it was, but still feel it with OH movements.   From eval: Pt presents to PT s/p Rt reverse total shoulder replacement performed 08/11/23.  [place] and had resultant torn rotator cuff and this lead to surgery. He is wearing sling just when out of the house.    Hand dominance: Right  PERTINENT HISTORY: Lumbar surgery x 2 in 1995, ORIF Rt ankle, TKA Lt 2021, fall in airport 06/2023 with concussion, rib fracture and Rt shoulder injury, Rt reverse TSA 08/11/23  PAIN:10/28/23 Are you having pain? Yes: NPRS scale: 0-3/10 at rest, up to/10 during movement Pain location: Rt shoulder  Pain description: stabbing, aching Aggravating factors: moving Rt UE into certain motions Relieving factors: pain meds, rest, ice  PRECAUTIONS: Other: follow post-op protocol  RED FLAGS: None   WEIGHT BEARING RESTRICTIONS: No  FALLS:  Has patient fallen in last 6 months? Yes. Number of falls prior to surgery- torn rotator cuff and labrum  LIVING ENVIRONMENT: Lives with: lives with their spouse Lives in: House/apartment Stairs: No Has following equipment at home: Single point cane    PLOF: Independent and Leisure: travel  PATIENT GOALS: return to prior level of function, use Rt UE   NEXT MD VISIT: 10/07/23  OBJECTIVE:  Note: Objective measures were completed at Evaluation unless otherwise noted.  DIAGNOSTIC FINDINGS:  NA  PATIENT SURVEYS :  Quick Dash Disability/Symptom Score:= 79.5% disability   10/19/23 Quick Dash  15.9 / 100 = 15.9 %  Minimally Clinically Important Difference (MCID): 15-20 points    COGNITION: Overall cognitive status: Within functional limits for tasks assessed     SENSATION: WFL  POSTURE: Forward head/rounded shoulders   UPPER EXTREMITY ROM:  11/02/23: functional flexion and ABD 70 deg in sitting/standing, ER hand to behind  ear, must tilt head, IR to back pocket,   Passive ROM Right eval Left eval Right  09/30/23 Right 10/19/23 Right 8/6 Right 11/02/23  Shoulder flexion 60 WFLs  105 109  117 120  Shoulder  extension        Shoulder abduction 50  65 70  85  Shoulder adduction        Shoulder internal rotation 30       Shoulder external rotation 15  30 33 40 43  Elbow flexion        Elbow extension        Wrist flexion        Wrist extension        Wrist ulnar deviation        Wrist radial deviation        Wrist pronation        Wrist supination        (Blank rows = not tested)  UPPER EXTREMITY MMT: Not tested due to post-op restrictions  11/02/23 R shoulder flex/ABD 2+/5, ER 3-/5  JOINT MOBILITY TESTING:  NA- post op  PALPATION:  Healing surgical incision over Rt anterior aspect of shoulder.  Reduced mobility and tenderness                                                                                                                              TREATMENT DATE:   11/09/23 UBE x 6 min (3/3) Standing fwd bent shoulder extension 2 x 10 with 2 lbs feels pain in ant shoulder (lat attachment) Standing fwd bent shoulder row 2 x 10 with 2 lbs Standing fwd bent horizontal abduction with 2 lbs 2 x 10 SL ER R x 10, with 1# 2x10 Supine ER stretch with hand behind bed Supine OH flexion with cane AA x 10, then with red band around wrists x 10 Supine L arm flexion x 5 in pain free range Supine single arm chest press 2 x 5 Seated horizontal ABD yellow x 10 limited range Lat pull down red x 30 Seated lat pull down bent arms x 20 Standing rows green Tband x 30 Standing ext green x 20    11/04/23 UBE x 6 min (3/3) Seated pulleys: flexion and scaption x 2 min each Standing fwd bent shoulder extension 2 x 10 with 2 lbs Standing fwd bent shoulder row 2 x 10 with 2 lbs Standing fwd bent horizontal abduction with 2 lbs 2 x 10 Seated bilateral shoulder ER 2 x 10 with yellow band Seated bilateral shouler  horizontal abduction with yellow band 2 x 10 Supine (with head elevated) AA shoulder flexion 2 x 10 using his cane Supine AA shoulder abduction using cane Attempted supine active fwd flexion but patient unable PROM all planes of motion, right shoulder  10/28/23 Arm bike level 1 x6 min (3/3)- PT present to discuss progress and to monitor for fatigue Goals assessed/re-auth form completed/ROM, MMT Supine OH lat strech with red band on wrists and legs in 90/90 on bolster x 10, then with dowel +2.5 # with prolonged hold x 1 min x 2 Rows and ext yellow band x 20 ea Red rows x 20  Red  lat pull down x 20 Epley's maneuver -1 rep-for canalith repositioning for left sided nystagmus/mild dizziness  Reviewed briefly VOR x1 and x2 exercises for vestibular hypofunction   10/26/23 Seated with noodle along spine: scap retraction then retraction + ER isometric with yellow band 5 sec hold x 10 B cues to stand up straight; R shoulder ABD yellow band x 10; active flexion x 10 L cues avoid elevation Arm bike: level 1x 6 min (3/3)- PT present to discuss progress and to monitor for fatigue. Standing at mirror working on depressing scapula  Finger ladder: flexion with tactile cues to depress scapula 2x5 Seated cervical lateral flexion B 5 sec hold x 10 Seated cervical flexion 5 sec hold x 10 Shoulder extension and rows: red band 2x10 S/L flex 2x 10 in pain free range - 2 pillows S/L ER 2x10 Supine ER x 10 with yellow band  Manual: PROM all flex, ABD, ER  PATIENT EDUCATION: Education details: [email or web address], over the door pulleys, scar mobs on Rt   Person educated: Patient Education method: Explanation, Demonstration, and Handouts Education comprehension: verbalized understanding and returned demonstration  HOME EXERCISE PROGRAM: Access Code: [email or web address] URL: [email or web address] Date: 11/09/2023 Prepared by: Mliss  Exercises - Seated Shoulder Shrug Circles AROM Backward  - 5 x daily - 7 x  weekly - 1 sets - 10 reps - Seated Shoulder Flexion Towel Slide at Table Top  - 2-3 x daily - 7 x weekly - 1 sets - 10 reps - Seated Elbow Flexion and Extension AROM  - 3 x daily - 7 x weekly - 1 sets - 10 reps - Supine Shoulder Flexion AAROM with Hands Clasped  - 1 x daily - 7 x weekly - 3 sets - 10 reps - Supine Shoulder External Rotation with Dowel  - 2 x daily - 7 x weekly - 1 sets - 10 reps - 10 hold - Isometric Shoulder Abduction at Wall  - 2 x daily - 7 x weekly - 2 sets - 5-10 reps - 5 hold - Standing Isometric Shoulder External Rotation with Doorway and Towel Roll  - 2 x daily - 7 x weekly - 2 sets - 5-10 reps - 5 hold - Sidelying Shoulder External Rotation AROM  - 1 x daily - 3 x weekly - 1-3 sets - 10 reps - Sidelying Shoulder Flexion 15 Degrees  - 1 x daily - 3-4 x weekly - 1-3 sets - 10 reps - 3 sec hold - Seated Gaze Stabilization with Head Rotation  - 3 x daily - 7 x weekly - Seated Gaze Stabilization with Head Nod  - 3 x daily - 7 x weekly - Standing Shoulder Row with Anchored Resistance  - 1 x daily - 3 x weekly - 2 sets - 10 reps - Shoulder extension with resistance - Neutral  - 1 x daily - 3 x weekly - 2 sets - 10 reps     ASSESSMENT:  CLINICAL IMPRESSION: Patient continues to report improvements. He notices is most in ER. He was able to actively flex his left shoulder in supine today which he could not do previously. His sharp pain in the anterior shoulder is getting better and still appears to be the lat attachment. He has some pain in that spot with bent over shoulder extension. Patient also reported some dizziness with return to stand after bent over exercises, so use caution with these.    OBJECTIVE IMPAIRMENTS: decreased ROM, decreased strength, increased edema, increased fascial restrictions, impaired perceived  functional ability, increased muscle spasms, impaired flexibility, impaired UE functional use, postural dysfunction, and pain.   ACTIVITY LIMITATIONS:  carrying, lifting, dressing, self feeding, reach over head, and hygiene/grooming  PARTICIPATION LIMITATIONS: meal prep, cleaning, laundry, driving, community activity, and yard work  PERSONAL FACTORS: Age and 1 comorbidity: s/p Rt TSA are also affecting patient's functional outcome.   REHAB POTENTIAL: Good  CLINICAL DECISION MAKING: Stable/uncomplicated  EVALUATION COMPLEXITY: Low  GOALS: Goals reviewed with patient? Yes  SHORT TERM GOALS: Target date: 10/12/2023    Be independent in initial HEP Baseline: has initial HEP (09/23/23) Goal status: Met      Demonstrate Rt shoulder P/ROM flexion to > or = to 100 degrees to promote overhead reaching  Baseline:  Goal status: MET     Improve QuickDASH to < or = to 68% disability  Baseline: 79%  Goal status: MET   Wean from sling and report > or = to 30% use of Rt UE with ADLs and dressing  Baseline:  Goal status: MET 50%   Demonstrate Rt shoulder P/ROM ER to > or = to 45 degrees to improve reaching behind head  Baseline: 15 Goal status: IN PROGRESS 10/14/23    LONG TERM GOALS: Target date: 01/05/2024    Be independent in advanced HEP Baseline:  Goal status: IN PROGRESS  2.  Improve QuickDASH to < or = to 50% disability for use with functional tasks  Baseline: 79%  Goal status: MET 15.9 / 100 = 15.9 %    3.  Demonstrate Rt shoulder A/ROM flexion to > or = to 110 degrees for reaching overhead Baseline:  Goal status: IN PROGRESS 11/02/23 70 degrees in sitting/standing (120 deg PROM)  4.  Report > or = to 60% use of Rt UE with ADLs and self-care due to improve ROM and functional strength  Baseline:  Goal status: IN PROGRESS  11/02/23  50%  5.  Demonstrate Rt UE A/ROM ER to occiput to aid with self-care Baseline:  Goal status: IN PROGRESS  11/02/23  to behind ear    PLAN: PT FREQUENCY: 2x/week  PT DURATION: 8 weeks  PLANNED INTERVENTIONS: 97110-Therapeutic exercises, 97530- Therapeutic activity, 97112- Neuromuscular  re-education, 97535- Self Care, [id number]- Manual therapy, [phone]- Canalith repositioning, [id number]- Aquatic Therapy, [id number]- Electrical stimulation (unattended), 20560 (1-2 muscles), 20561 (3+ muscles)- Dry Needling, Patient/Family education, Taping, Joint mobilization, Scar mobilization, Cryotherapy, and Moist heat  PLAN FOR NEXT SESSION: progress per protocol, postural strengthening, work on strengthening for functional gains. continue Epley's treatment to left side as needed  Mliss Cummins, PT  11/09/23 3:51 PM [facility] [address] [place], [place] [address] Phone # [phone] Fax [phone]

## 2023-11-09 ENCOUNTER — Encounter: Payer: Self-pay | Admitting: Physical Therapy

## 2023-11-09 ENCOUNTER — Ambulatory Visit: Admitting: Physical Therapy

## 2023-11-09 DIAGNOSIS — M25611 Stiffness of right shoulder, not elsewhere classified: Secondary | ICD-10-CM

## 2023-11-09 DIAGNOSIS — R252 Cramp and spasm: Secondary | ICD-10-CM | POA: Diagnosis not present

## 2023-11-09 DIAGNOSIS — M6281 Muscle weakness (generalized): Secondary | ICD-10-CM | POA: Diagnosis not present

## 2023-11-09 DIAGNOSIS — M25511 Pain in right shoulder: Secondary | ICD-10-CM

## 2023-11-09 DIAGNOSIS — R293 Abnormal posture: Secondary | ICD-10-CM

## 2023-11-11 ENCOUNTER — Ambulatory Visit

## 2023-11-11 DIAGNOSIS — M25611 Stiffness of right shoulder, not elsewhere classified: Secondary | ICD-10-CM

## 2023-11-11 DIAGNOSIS — R293 Abnormal posture: Secondary | ICD-10-CM | POA: Diagnosis not present

## 2023-11-11 DIAGNOSIS — M25511 Pain in right shoulder: Secondary | ICD-10-CM

## 2023-11-11 DIAGNOSIS — M6281 Muscle weakness (generalized): Secondary | ICD-10-CM | POA: Diagnosis not present

## 2023-11-11 DIAGNOSIS — R252 Cramp and spasm: Secondary | ICD-10-CM | POA: Diagnosis not present

## 2023-11-11 NOTE — Therapy (Signed)
 OUTPATIENT PHYSICAL THERAPY TREATMENT NOTE    Patient Name: Andrew Moran MRN: [id number] DOB:03-19-1945, 78 y.o., male Today's Date: 11/11/2023  END OF SESSION:  PT End of Session - 11/11/23 1542     Visit Number 18    Number of Visits 24    Date for PT Re-Evaluation 01/05/24    Authorization Type [employer] Medicare 8 visits approved    Authorization Time Period 8/25 to 9/22    Authorization - Visit Number 2    Authorization - Number of Visits 8    Progress Note Due on Visit 25    PT Start Time 0245    PT Stop Time 0330    PT Time Calculation (min) 45 min    Activity Tolerance Patient tolerated treatment well    Behavior During Therapy [facility] for tasks assessed/performed             Past Medical History:  Diagnosis Date   Arthritis    Asthma    pulmology-- dr wert--- cough varient versus uacs   GERD (gastroesophageal reflux disease)    per pt takes DGL supplement   History of diverticulitis of colon 2002   managed medically , no surgical intervention   History of DVT of lower extremity 06/27/2019   followed by dr g. loni--  completed 3 months xarelto  07/ 2021   History of kidney stones    Renal calculi    left side   Past Surgical History:  Procedure Laterality Date   ANKLE HARDWARE REMOVAL Right 2008   CARPAL TUNNEL RELEASE Bilateral left 1983;  right 1984   CYSTOSCOPY WITH RETROGRADE PYELOGRAM, URETEROSCOPY AND STENT PLACEMENT Left 02/23/2020   Procedure: CYSTOSCOPY WITH RETROGRADE PYELOGRAM, URETEROSCOPY, LITHOTRIPSY, STONE EXTRACTION,  AND STENT PLACEMENT;  Surgeon: Matilda Senior, MD;  Location: [facility];  Service: Urology;  Laterality: Left;   CYSTOSCOPY/RETROGRADE/URETEROSCOPY/STONE EXTRACTION WITH BASKET Bilateral 10/10/2013   Procedure: CYSTOSCOPY/URETEROSCOPY/STONE EXTRACTION WITH HOLMIUM LASER, bilateral retrograde, bilateral ureteral stents;  Surgeon: Senior CHRISTELLA Matilda, MD;  Location: [facility];  Service: Urology;  Laterality:  Bilateral;   EXPLORATORY LAPAROTOMY  1970   EXPLORATORY STOMACH SURGERY FOR TEAR IN STOMACH WALL   EXTRACORPOREAL SHOCK WAVE LITHOTRIPSY Left 10/06/2019   Procedure: EXTRACORPOREAL SHOCK WAVE LITHOTRIPSY (ESWL);  Surgeon: Elisabeth Valli BIRCH, MD;  Location: [facility];  Service: Urology;  Laterality: Left;   EXTRACORPOREAL SHOCK WAVE LITHOTRIPSY  multiple since 2002,  last one 02-28-2013   HOLMIUM LASER APPLICATION Left 10/10/2013   Procedure: HOLMIUM LASER APPLICATION;  Surgeon: Senior CHRISTELLA Matilda, MD;  Location: [facility];  Service: Urology;  Laterality: Left;   KNEE ARTHROSCOPY  02/05/2011   Procedure: ARTHROSCOPY KNEE;  Surgeon: Lynwood SQUIBB [place];  Location: [facility];  Service: Orthopedics;  Laterality: Right;  Right knee arthroscopy partial lateral and medial meniscectomy with condyle debridement   LUMBAR SPINE SURGERY  x2  1995   ORIF ANKLE FRACTURE Right 1988   ROTATOR CUFF REPAIR Bilateral right 02/16/2018;  left 1997   TONSILLECTOMY AND ADENOIDECTOMY  1951   TOTAL KNEE ARTHROPLASTY Left 04/08/2019   Procedure: TOTAL KNEE ARTHROPLASTY;  Surgeon: Gerome Charleston, MD;  Location: [facility];  Service: Orthopedics;  Laterality: Left;  with adductor canal   Patient Active Problem List   Diagnosis Date Noted   Cellulitis 08/17/2023   Asthma exacerbation 04/22/2021   History of total knee arthroplasty 04/08/2019   DOE (dyspnea on exertion) 11/09/2018   Cough variant asthma vs UACS  06/27/2016  PCP: Kip Righter, MD  REFERRING PROVIDER: Melita Drivers, MD  REFERRING DIAG:  Diagnosis  M25.511 (ICD-10-CM) - Pain in right shoulder  S/p Rt reverse total shoulder replacement   THERAPY DIAG:  Acute pain of right shoulder  Cramp and spasm  Stiffness of right shoulder, not elsewhere classified  Abnormal posture  Muscle weakness (generalized)  Rationale for Evaluation and Treatment: Rehabilitation  ONSET DATE: surgery 08/11/23  SUBJECTIVE:                                                                                                                                                                                       SUBJECTIVE STATEMENT: Pt reports no aches and pains when he's not doing anything. He did have some aches and pains after PT Monday but nothing too serious.   From eval: Pt presents to PT s/p Rt reverse total shoulder replacement performed 08/11/23.  [place] and had resultant torn rotator cuff and this lead to surgery. He is wearing sling just when out of the house.    Hand dominance: Right  PERTINENT HISTORY: Lumbar surgery x 2 in 1995, ORIF Rt ankle, TKA Lt 2021, fall in airport 06/2023 with concussion, rib fracture and Rt shoulder injury, Rt reverse TSA 08/11/23  PAIN:11/11/23 Are you having pain? Yes: NPRS scale: 0/10 at rest, up to/10 during movement Pain location: Rt shoulder  Pain description: stabbing, aching Aggravating factors: moving Rt UE into certain motions Relieving factors: pain meds, rest, ice  PRECAUTIONS: Other: follow post-op protocol  RED FLAGS: None   WEIGHT BEARING RESTRICTIONS: No  FALLS:  Has patient fallen in last 6 months? Yes. Number of falls prior to surgery- torn rotator cuff and labrum  LIVING ENVIRONMENT: Lives with: lives with their spouse Lives in: House/apartment Stairs: No Has following equipment at home: Single point cane    PLOF: Independent and Leisure: travel  PATIENT GOALS: return to prior level of function, use Rt UE   NEXT MD VISIT: 10/07/23  OBJECTIVE:  Note: Objective measures were completed at Evaluation unless otherwise noted.  DIAGNOSTIC FINDINGS:  NA  PATIENT SURVEYS :  Quick Dash Disability/Symptom Score:= 79.5% disability   10/19/23 Quick Dash  15.9 / 100 = 15.9 %  Minimally Clinically Important Difference (MCID): 15-20 points    COGNITION: Overall cognitive status: Within functional limits for tasks  assessed     SENSATION: WFL  POSTURE: Forward head/rounded shoulders   UPPER EXTREMITY ROM:  11/02/23: functional flexion and ABD 70 deg in sitting/standing, ER hand to behind ear, must tilt head, IR to back pocket,   Passive ROM Right eval Left eval Right  09/30/23 Right 10/19/23 Right 8/6 Right 11/02/23  Shoulder flexion 60 WFLs  105 109  117 120  Shoulder extension        Shoulder abduction 50  65 70  85  Shoulder adduction        Shoulder internal rotation 30       Shoulder external rotation 15  30 33 40 43  Elbow flexion        Elbow extension        Wrist flexion        Wrist extension        Wrist ulnar deviation        Wrist radial deviation        Wrist pronation        Wrist supination        (Blank rows = not tested)  UPPER EXTREMITY MMT: Not tested due to post-op restrictions  11/02/23 R shoulder flex/ABD 2+/5, ER 3-/5  JOINT MOBILITY TESTING:  NA- post op  PALPATION:  Healing surgical incision over Rt anterior aspect of shoulder.  Reduced mobility and tenderness                                                                                                                              TREATMENT DATE:   11/11/23 UBE x 6 min (3/3)- SPT present for goal assessment and to provide cueing  Supine forward flexion bilaterally with dowel 2x10 for improving shoulder ROM Posterior pelvic tilts 2x10 for lumbopelvic stability Seated Lev scap stretch 2x10 Seated ball press for TA activation 2x8 Standing rows with green theraband 2x10 Standing shoulder extension with red theraband 3x10 Dix Hallpike negative for nystagmus and dizziness on both sides  11/09/23 UBE x 6 min (3/3) Standing fwd bent shoulder extension 2 x 10 with 2 lbs feels pain in ant shoulder (lat attachment) Standing fwd bent shoulder row 2 x 10 with 2 lbs Standing fwd bent horizontal abduction with 2 lbs 2 x 10 SL ER R x 10, with 1# 2x10 Supine ER stretch with hand behind bed Supine OH flexion  with cane AA x 10, then with red band around wrists x 10 Supine L arm flexion x 5 in pain free range Supine single arm chest press 2 x 5 Seated horizontal ABD yellow x 10 limited range Lat pull down red x 30 Seated lat pull down bent arms x 20 Standing rows green Tband x 30 Standing ext green x 20    11/04/23 UBE x 6 min (3/3) Seated pulleys: flexion and scaption x 2 min each Standing fwd bent shoulder extension 2 x 10 with 2 lbs Standing fwd bent shoulder row 2 x 10 with 2 lbs Standing fwd bent horizontal abduction with 2 lbs 2 x 10 Seated bilateral shoulder ER 2 x 10 with yellow band Seated bilateral shouler horizontal abduction with yellow band 2 x 10 Supine (with head elevated) AA shoulder flexion 2 x 10 using his  cane Supine AA shoulder abduction using cane Attempted supine active fwd flexion but patient unable PROM all planes of motion, right shoulder    PATIENT EDUCATION: Education details: [email or web address], over the door pulleys, scar mobs on Rt   Person educated: Patient Education method: Explanation, Demonstration, and Handouts Education comprehension: verbalized understanding and returned demonstration  HOME EXERCISE PROGRAM: Access Code: [email or web address] URL: [email or web address] Date: 11/09/2023 Prepared by: Mliss  Exercises - Seated Shoulder Shrug Circles AROM Backward  - 5 x daily - 7 x weekly - 1 sets - 10 reps - Seated Shoulder Flexion Towel Slide at Table Top  - 2-3 x daily - 7 x weekly - 1 sets - 10 reps - Seated Elbow Flexion and Extension AROM  - 3 x daily - 7 x weekly - 1 sets - 10 reps - Supine Shoulder Flexion AAROM with Hands Clasped  - 1 x daily - 7 x weekly - 3 sets - 10 reps - Supine Shoulder External Rotation with Dowel  - 2 x daily - 7 x weekly - 1 sets - 10 reps - 10 hold - Isometric Shoulder Abduction at Wall  - 2 x daily - 7 x weekly - 2 sets - 5-10 reps - 5 hold - Standing Isometric Shoulder External Rotation with Doorway and Towel Roll  -  2 x daily - 7 x weekly - 2 sets - 5-10 reps - 5 hold - Sidelying Shoulder External Rotation AROM  - 1 x daily - 3 x weekly - 1-3 sets - 10 reps - Sidelying Shoulder Flexion 15 Degrees  - 1 x daily - 3-4 x weekly - 1-3 sets - 10 reps - 3 sec hold - Seated Gaze Stabilization with Head Rotation  - 3 x daily - 7 x weekly - Seated Gaze Stabilization with Head Nod  - 3 x daily - 7 x weekly - Standing Shoulder Row with Anchored Resistance  - 1 x daily - 3 x weekly - 2 sets - 10 reps - Shoulder extension with resistance - Neutral  - 1 x daily - 3 x weekly - 2 sets - 10 reps     ASSESSMENT:  CLINICAL IMPRESSION:  Andrew Moran presents to skilled therapy stating he has decreased right sided shoulder pain, and reports having no pain when not moving. Pt reported being intermittently dizzy a few times this past week, performed [employer] but pt was negative on both sides, stating he felt better when he sat up. He says his dizziness has decreased since coming to PT and from doing VOR exercises given to him in his HEP. He reports he's mildly sore from last session, and wouldn't mind taking it easy for today's. Thus session focused on light exercises that targeted improving shoulder mobility and postural control, including abdominal/core and periscapular muscle activation, and resolving his dizzy episodes. Pt will benefit from skilled PT to address the deficits below and moving closer to his goal related activities.   OBJECTIVE IMPAIRMENTS: decreased ROM, decreased strength, increased edema, increased fascial restrictions, impaired perceived functional ability, increased muscle spasms, impaired flexibility, impaired UE functional use, postural dysfunction, and pain.   ACTIVITY LIMITATIONS: carrying, lifting, dressing, self feeding, reach over head, and hygiene/grooming  PARTICIPATION LIMITATIONS: meal prep, cleaning, laundry, driving, community activity, and yard work  PERSONAL FACTORS: Age and 1 comorbidity:  s/p Rt TSA are also affecting patient's functional outcome.   REHAB POTENTIAL: Good  CLINICAL DECISION MAKING: Stable/uncomplicated  EVALUATION COMPLEXITY: Low  GOALS: Goals reviewed with patient? Yes  SHORT TERM GOALS: Target date: 10/12/2023    Be independent in initial HEP Baseline: has initial HEP (09/23/23) Goal status: Met      Demonstrate Rt shoulder P/ROM flexion to > or = to 100 degrees to promote overhead reaching  Baseline:  Goal status: MET     Improve QuickDASH to < or = to 68% disability  Baseline: 79%  Goal status: MET   Wean from sling and report > or = to 30% use of Rt UE with ADLs and dressing  Baseline:  Goal status: MET 50%   Demonstrate Rt shoulder P/ROM ER to > or = to 45 degrees to improve reaching behind head  Baseline: 15 Goal status: IN PROGRESS 10/14/23    LONG TERM GOALS: Target date: 01/05/2024    Be independent in advanced HEP Baseline:  Goal status: IN PROGRESS  2.  Improve QuickDASH to < or = to 50% disability for use with functional tasks  Baseline: 79%  Goal status: MET 15.9 / 100 = 15.9 %    3.  Demonstrate Rt shoulder A/ROM flexion to > or = to 110 degrees for reaching overhead Baseline:  Goal status: IN PROGRESS 11/02/23 70 degrees in sitting/standing (120 deg PROM)  4.  Report > or = to 60% use of Rt UE with ADLs and self-care due to improve ROM and functional strength  Baseline:  Goal status: IN PROGRESS  11/02/23  50%  5.  Demonstrate Rt UE A/ROM ER to occiput to aid with self-care Baseline:  Goal status: IN PROGRESS  11/02/23  to behind ear    PLAN: PT FREQUENCY: 2x/week  PT DURATION: 8 weeks  PLANNED INTERVENTIONS: 97110-Therapeutic exercises, 97530- Therapeutic activity, 97112- Neuromuscular re-education, 97535- Self Care, [id number]- Manual therapy, [phone]- Canalith repositioning, [id number]- Aquatic Therapy, [id number]- Electrical stimulation (unattended), 20560 (1-2 muscles), 20561 (3+ muscles)- Dry Needling, Patient/Family  education, Taping, Joint mobilization, Scar mobilization, Cryotherapy, and Moist heat  PLAN FOR NEXT SESSION: progress per protocol, postural strengthening, work on strengthening for functional gains. continue Epley's treatment to left side as needed, trunk stability exercises, shoulder mobility   Lavanda Cleverly, SPT 11/11/23 4:11 PM I agree with the following treatment note after reviewing documentation. This session was performed under the supervision of a licensed clinician.  Burnard Joy, PT 11/11/23 4:13 PM  [facility] [address] [place], [place] [address] Phone # [phone] Fax [phone]

## 2023-11-17 ENCOUNTER — Ambulatory Visit: Attending: Orthopedic Surgery

## 2023-11-17 DIAGNOSIS — M25511 Pain in right shoulder: Secondary | ICD-10-CM | POA: Diagnosis not present

## 2023-11-17 DIAGNOSIS — R252 Cramp and spasm: Secondary | ICD-10-CM | POA: Insufficient documentation

## 2023-11-17 DIAGNOSIS — R293 Abnormal posture: Secondary | ICD-10-CM | POA: Insufficient documentation

## 2023-11-17 DIAGNOSIS — M6281 Muscle weakness (generalized): Secondary | ICD-10-CM | POA: Insufficient documentation

## 2023-11-17 DIAGNOSIS — M25611 Stiffness of right shoulder, not elsewhere classified: Secondary | ICD-10-CM | POA: Insufficient documentation

## 2023-11-17 NOTE — Therapy (Signed)
 OUTPATIENT PHYSICAL THERAPY TREATMENT NOTE    Patient Name: Andrew Moran MRN: [id number] DOB:[birth year], 78 y.o., male Today's Date: 11/17/2023  END OF SESSION:  PT End of Session - 11/17/23 1614     Visit Number 19    Number of Visits 24    Date for PT Re-Evaluation 01/05/24    Authorization Type [employer] Medicare 8 visits approved    Authorization Time Period 8/25 to 9/22    Authorization - Visit Number 3    Authorization - Number of Visits 8    Progress Note Due on Visit 25    PT Start Time 1614    PT Stop Time 1700    PT Time Calculation (min) 46 min    Activity Tolerance Patient tolerated treatment well    Behavior During Therapy [facility] for tasks assessed/performed             Past Medical History:  Diagnosis Date   Arthritis    Asthma    pulmology-- dr wert--- cough varient versus uacs   GERD (gastroesophageal reflux disease)    per pt takes DGL supplement   History of diverticulitis of colon 2002   managed medically , no surgical intervention   History of DVT of lower extremity 06/27/2019   followed by dr g. loni--  completed 3 months xarelto  07/ 2021   History of kidney stones    Renal calculi    left side   Past Surgical History:  Procedure Laterality Date   ANKLE HARDWARE REMOVAL Right 2008   CARPAL TUNNEL RELEASE Bilateral left 1983;  right 1984   CYSTOSCOPY WITH RETROGRADE PYELOGRAM, URETEROSCOPY AND STENT PLACEMENT Left 02/23/2020   Procedure: CYSTOSCOPY WITH RETROGRADE PYELOGRAM, URETEROSCOPY, LITHOTRIPSY, STONE EXTRACTION,  AND STENT PLACEMENT;  Surgeon: Matilda Senior, MD;  Location: [facility];  Service: Urology;  Laterality: Left;   CYSTOSCOPY/RETROGRADE/URETEROSCOPY/STONE EXTRACTION WITH BASKET Bilateral 10/10/2013   Procedure: CYSTOSCOPY/URETEROSCOPY/STONE EXTRACTION WITH HOLMIUM LASER, bilateral retrograde, bilateral ureteral stents;  Surgeon: Senior CHRISTELLA Matilda, MD;  Location: [facility];  Service: Urology;  Laterality:  Bilateral;   EXPLORATORY LAPAROTOMY  1970   EXPLORATORY STOMACH SURGERY FOR TEAR IN STOMACH WALL   EXTRACORPOREAL SHOCK WAVE LITHOTRIPSY Left 10/06/2019   Procedure: EXTRACORPOREAL SHOCK WAVE LITHOTRIPSY (ESWL);  Surgeon: Elisabeth Valli BIRCH, MD;  Location: [facility];  Service: Urology;  Laterality: Left;   EXTRACORPOREAL SHOCK WAVE LITHOTRIPSY  multiple since 2002,  last one 02-28-2013   HOLMIUM LASER APPLICATION Left 10/10/2013   Procedure: HOLMIUM LASER APPLICATION;  Surgeon: Senior CHRISTELLA Matilda, MD;  Location: [facility];  Service: Urology;  Laterality: Left;   KNEE ARTHROSCOPY  02/05/2011   Procedure: ARTHROSCOPY KNEE;  Surgeon: Lynwood SQUIBB [place];  Location: [facility];  Service: Orthopedics;  Laterality: Right;  Right knee arthroscopy partial lateral and medial meniscectomy with condyle debridement   LUMBAR SPINE SURGERY  x2  1995   ORIF ANKLE FRACTURE Right 1988   ROTATOR CUFF REPAIR Bilateral right 02/16/2018;  left 1997   TONSILLECTOMY AND ADENOIDECTOMY  1951   TOTAL KNEE ARTHROPLASTY Left 04/08/2019   Procedure: TOTAL KNEE ARTHROPLASTY;  Surgeon: Gerome Charleston, MD;  Location: [facility];  Service: Orthopedics;  Laterality: Left;  with adductor canal   Patient Active Problem List   Diagnosis Date Noted   Cellulitis 08/17/2023   Asthma exacerbation 04/22/2021   History of total knee arthroplasty 04/08/2019   DOE (dyspnea on exertion) 11/09/2018   Cough variant asthma vs UACS  06/27/2016  PCP: Kip Righter, MD  REFERRING PROVIDER: Melita Drivers, MD  REFERRING DIAG:  Diagnosis  M25.511 (ICD-10-CM) - Pain in right shoulder  S/p Rt reverse total shoulder replacement   THERAPY DIAG:  Acute pain of right shoulder  Stiffness of right shoulder, not elsewhere classified  Cramp and spasm  Muscle weakness (generalized)  Abnormal posture  Rationale for Evaluation and Treatment: Rehabilitation  ONSET DATE: surgery 08/11/23  SUBJECTIVE:                                                                                                                                                                                       SUBJECTIVE STATEMENT: Pt reports no pain at rest. Sleeping better but still having trouble reaching overhead.    From eval: Pt presents to PT s/p Rt reverse total shoulder replacement performed 08/11/23.  [place] and had resultant torn rotator cuff and this lead to surgery. He is wearing sling just when out of the house.    Hand dominance: Right  PERTINENT HISTORY: Lumbar surgery x 2 in 1995, ORIF Rt ankle, TKA Lt 2021, fall in airport 06/2023 with concussion, rib fracture and Rt shoulder injury, Rt reverse TSA 08/11/23  PAIN:11/11/23 Are you having pain? Yes: NPRS scale: 0/10 at rest, up to/10 during movement Pain location: Rt shoulder  Pain description: stabbing, aching Aggravating factors: moving Rt UE into certain motions Relieving factors: pain meds, rest, ice  PRECAUTIONS: Other: follow post-op protocol  RED FLAGS: None   WEIGHT BEARING RESTRICTIONS: No  FALLS:  Has patient fallen in last 6 months? Yes. Number of falls prior to surgery- torn rotator cuff and labrum  LIVING ENVIRONMENT: Lives with: lives with their spouse Lives in: House/apartment Stairs: No Has following equipment at home: Single point cane    PLOF: Independent and Leisure: travel  PATIENT GOALS: return to prior level of function, use Rt UE   NEXT MD VISIT: 10/07/23  OBJECTIVE:  Note: Objective measures were completed at Evaluation unless otherwise noted.  DIAGNOSTIC FINDINGS:  NA  PATIENT SURVEYS :  Quick Dash Disability/Symptom Score:= 79.5% disability   10/19/23 Quick Dash  15.9 / 100 = 15.9 %  Minimally Clinically Important Difference (MCID): 15-20 points    COGNITION: Overall cognitive status: Within functional limits for tasks assessed     SENSATION: WFL  POSTURE: Forward head/rounded shoulders   UPPER  EXTREMITY ROM:  11/02/23: functional flexion and ABD 70 deg in sitting/standing, ER hand to behind ear, must tilt head, IR to back pocket,   Passive ROM Right eval Left eval Right  09/30/23 Right 10/19/23 Right 8/6 Right 11/02/23  Shoulder  flexion 60 WFLs  105 109  117 120  Shoulder extension        Shoulder abduction 50  65 70  85  Shoulder adduction        Shoulder internal rotation 30       Shoulder external rotation 15  30 33 40 43  Elbow flexion        Elbow extension        Wrist flexion        Wrist extension        Wrist ulnar deviation        Wrist radial deviation        Wrist pronation        Wrist supination        (Blank rows = not tested)  UPPER EXTREMITY MMT: Not tested due to post-op restrictions  11/02/23 R shoulder flex/ABD 2+/5, ER 3-/5  JOINT MOBILITY TESTING:  NA- post op  PALPATION:  Healing surgical incision over Rt anterior aspect of shoulder.  Reduced mobility and tenderness                                                                                                                              TREATMENT DATE:  11/17/23 UBE x 6 min (3/3) Functional reach with light object (massage cream jar) from counter to first shelf. X 10 Standing in front of mirror for visual feedback : field goal reaches x 10 Educated on STM using tennis or LAX ball behind on the wall x 2 min Standing shoulder extension and rows with green tband with handles 2 x 10 each Seated bilateral shoulder ER and horizontal abduction with yellow tband 2 x 10 each Doorway stretch for anterior shoulder x 10 hold 5 sec Shoulder ER stretch at doorway x 10 hold 5 sec each  Wall crawls at doorway right shoulder x 5 Seated with soft foam roller behind, vc's to pinch shoulder blades around roll with low range shoulder horizontal abduction. 2 x 10  11/11/23 UBE x 6 min (3/3)- SPT present for goal assessment and to provide cueing  Supine forward flexion bilaterally with dowel 2x10 for improving  shoulder ROM Posterior pelvic tilts 2x10 for lumbopelvic stability Seated Lev scap stretch 2x10 Seated ball press for TA activation 2x8 Standing rows with green theraband 2x10 Standing shoulder extension with red theraband 3x10 Dix Hallpike negative for nystagmus and dizziness on both sides  11/09/23 UBE x 6 min (3/3) Standing fwd bent shoulder extension 2 x 10 with 2 lbs feels pain in ant shoulder (lat attachment) Standing fwd bent shoulder row 2 x 10 with 2 lbs Standing fwd bent horizontal abduction with 2 lbs 2 x 10 SL ER R x 10, with 1# 2x10 Supine ER stretch with hand behind bed Supine OH flexion with cane AA x 10, then with red band around wrists x 10 Supine L arm flexion x 5 in pain free range Supine single arm chest  press 2 x 5 Seated horizontal ABD yellow x 10 limited range Lat pull down red x 30 Seated lat pull down bent arms x 20 Standing rows green Tband x 30 Standing ext green x 20   PATIENT EDUCATION: Education details: [email or web address], over the door pulleys, scar mobs on Rt   Person educated: Patient Education method: Explanation, Demonstration, and Handouts Education comprehension: verbalized understanding and returned demonstration  HOME EXERCISE PROGRAM: Access Code: [email or web address] URL: [email or web address] Date: 11/09/2023 Prepared by: Mliss  Exercises - Seated Shoulder Shrug Circles AROM Backward  - 5 x daily - 7 x weekly - 1 sets - 10 reps - Seated Shoulder Flexion Towel Slide at Table Top  - 2-3 x daily - 7 x weekly - 1 sets - 10 reps - Seated Elbow Flexion and Extension AROM  - 3 x daily - 7 x weekly - 1 sets - 10 reps - Supine Shoulder Flexion AAROM with Hands Clasped  - 1 x daily - 7 x weekly - 3 sets - 10 reps - Supine Shoulder External Rotation with Dowel  - 2 x daily - 7 x weekly - 1 sets - 10 reps - 10 hold - Isometric Shoulder Abduction at Wall  - 2 x daily - 7 x weekly - 2 sets - 5-10 reps - 5 hold - Standing Isometric Shoulder External  Rotation with Doorway and Towel Roll  - 2 x daily - 7 x weekly - 2 sets - 5-10 reps - 5 hold - Sidelying Shoulder External Rotation AROM  - 1 x daily - 3 x weekly - 1-3 sets - 10 reps - Sidelying Shoulder Flexion 15 Degrees  - 1 x daily - 3-4 x weekly - 1-3 sets - 10 reps - 3 sec hold - Seated Gaze Stabilization with Head Rotation  - 3 x daily - 7 x weekly - Seated Gaze Stabilization with Head Nod  - 3 x daily - 7 x weekly - Standing Shoulder Row with Anchored Resistance  - 1 x daily - 3 x weekly - 2 sets - 10 reps - Shoulder extension with resistance - Neutral  - 1 x daily - 3 x weekly - 2 sets - 10 reps     ASSESSMENT:  CLINICAL IMPRESSION:  Andrew Moran is progressing slowly with functional gains.  He was able to do some light reaching from countertop to first shelf.  We worked heavily on postural strengthening and anterior shoulder/pec stretching.  He continues to experience sharp pain at the anterior shoulder which is likely latissimus attachment pain.  He is limited due to posture and other orthopedic issues.  We will continue to focus on functional gains.  Suggested he use ice after each exercise session at home and before bed.   Pt will benefit from skilled PT to address the deficits below and moving closer to his goal related activities.   OBJECTIVE IMPAIRMENTS: decreased ROM, decreased strength, increased edema, increased fascial restrictions, impaired perceived functional ability, increased muscle spasms, impaired flexibility, impaired UE functional use, postural dysfunction, and pain.   ACTIVITY LIMITATIONS: carrying, lifting, dressing, self feeding, reach over head, and hygiene/grooming  PARTICIPATION LIMITATIONS: meal prep, cleaning, laundry, driving, community activity, and yard work  PERSONAL FACTORS: Age and 1 comorbidity: s/p Rt TSA are also affecting patient's functional outcome.   REHAB POTENTIAL: Good  CLINICAL DECISION MAKING: Stable/uncomplicated  EVALUATION COMPLEXITY:  Low  GOALS: Goals reviewed with patient? Yes  SHORT TERM GOALS: Target date: 10/12/2023    Be independent in initial  HEP Baseline: has initial HEP (09/23/23) Goal status: Met      Demonstrate Rt shoulder P/ROM flexion to > or = to 100 degrees to promote overhead reaching  Baseline:  Goal status: MET     Improve QuickDASH to < or = to 68% disability  Baseline: 79%  Goal status: MET   Wean from sling and report > or = to 30% use of Rt UE with ADLs and dressing  Baseline:  Goal status: MET 50%   Demonstrate Rt shoulder P/ROM ER to > or = to 45 degrees to improve reaching behind head  Baseline: 15 Goal status: IN PROGRESS 10/14/23    LONG TERM GOALS: Target date: 01/05/2024    Be independent in advanced HEP Baseline:  Goal status: IN PROGRESS  2.  Improve QuickDASH to < or = to 50% disability for use with functional tasks  Baseline: 79%  Goal status: MET 15.9 / 100 = 15.9 %    3.  Demonstrate Rt shoulder A/ROM flexion to > or = to 110 degrees for reaching overhead Baseline:  Goal status: IN PROGRESS 11/02/23 70 degrees in sitting/standing (120 deg PROM)  4.  Report > or = to 60% use of Rt UE with ADLs and self-care due to improve ROM and functional strength  Baseline:  Goal status: IN PROGRESS  11/02/23  50%  5.  Demonstrate Rt UE A/ROM ER to occiput to aid with self-care Baseline:  Goal status: IN PROGRESS  11/02/23  to behind ear    PLAN: PT FREQUENCY: 2x/week  PT DURATION: 8 weeks  PLANNED INTERVENTIONS: 97110-Therapeutic exercises, 97530- Therapeutic activity, 97112- Neuromuscular re-education, 97535- Self Care, [id number]- Manual therapy, [phone]- Canalith repositioning, [id number]- Aquatic Therapy, [id number]- Electrical stimulation (unattended), 20560 (1-2 muscles), 20561 (3+ muscles)- Dry Needling, Patient/Family education, Taping, Joint mobilization, Scar mobilization, Cryotherapy, and Moist heat  PLAN FOR NEXT SESSION: functional reaching exercises, progress per protocol,  postural strengthening, work on strengthening for functional gains. continue Epley's treatment to left side as needed, trunk stability exercises, shoulder mobility   Kyian Obst B. Alexandre Faries, PT 11/17/23 5:20 PM [facility] [address] [place], [place] [address] Phone # [phone] Fax [phone]

## 2023-11-19 DIAGNOSIS — Z96611 Presence of right artificial shoulder joint: Secondary | ICD-10-CM | POA: Diagnosis not present

## 2023-11-24 ENCOUNTER — Ambulatory Visit (INDEPENDENT_AMBULATORY_CARE_PROVIDER_SITE_OTHER)

## 2023-11-24 DIAGNOSIS — M25611 Stiffness of right shoulder, not elsewhere classified: Secondary | ICD-10-CM | POA: Diagnosis not present

## 2023-11-24 DIAGNOSIS — R252 Cramp and spasm: Secondary | ICD-10-CM | POA: Diagnosis not present

## 2023-11-24 DIAGNOSIS — R293 Abnormal posture: Secondary | ICD-10-CM | POA: Diagnosis not present

## 2023-11-24 DIAGNOSIS — M25511 Pain in right shoulder: Secondary | ICD-10-CM

## 2023-11-24 DIAGNOSIS — M6281 Muscle weakness (generalized): Secondary | ICD-10-CM | POA: Diagnosis not present

## 2023-11-24 NOTE — Therapy (Signed)
 OUTPATIENT PHYSICAL THERAPY TREATMENT NOTE  Progress Note Reporting Period 10/19/23 to 11/24/23  See note below for Objective Data and Assessment of Progress/Goals.       Patient Name: Andrew Moran MRN: [id number] DOB:05/05/1945, 78 y.o., male Today's Date: 11/24/2023  END OF SESSION:  PT End of Session - 11/24/23 1455     Visit Number 20    Number of Visits 24    Date for PT Re-Evaluation 01/05/24    Authorization Type [employer] Medicare 8 visits approved    Authorization Time Period 8/25 to 9/22    Authorization - Visit Number 4    Authorization - Number of Visits 8    Progress Note Due on Visit 25    PT Start Time 1450    PT Stop Time 1530    PT Time Calculation (min) 40 min    Activity Tolerance Patient tolerated treatment well    Behavior During Therapy [facility] for tasks assessed/performed             Past Medical History:  Diagnosis Date   Arthritis    Asthma    pulmology-- dr wert--- cough varient versus uacs   GERD (gastroesophageal reflux disease)    per pt takes DGL supplement   History of diverticulitis of colon 2002   managed medically , no surgical intervention   History of DVT of lower extremity 06/27/2019   followed by dr g. loni--  completed 3 months xarelto  07/ 2021   History of kidney stones    Renal calculi    left side   Past Surgical History:  Procedure Laterality Date   ANKLE HARDWARE REMOVAL Right 2008   CARPAL TUNNEL RELEASE Bilateral left 1983;  right 1984   CYSTOSCOPY WITH RETROGRADE PYELOGRAM, URETEROSCOPY AND STENT PLACEMENT Left 02/23/2020   Procedure: CYSTOSCOPY WITH RETROGRADE PYELOGRAM, URETEROSCOPY, LITHOTRIPSY, STONE EXTRACTION,  AND STENT PLACEMENT;  Surgeon: Matilda Senior, MD;  Location: [facility] [place];  Service: Urology;  Laterality: Left;   CYSTOSCOPY/RETROGRADE/URETEROSCOPY/STONE EXTRACTION WITH BASKET Bilateral 10/10/2013   Procedure: CYSTOSCOPY/URETEROSCOPY/STONE EXTRACTION WITH HOLMIUM LASER, bilateral  retrograde, bilateral ureteral stents;  Surgeon: Senior CHRISTELLA Matilda, MD;  Location: [facility];  Service: Urology;  Laterality: Bilateral;   EXPLORATORY LAPAROTOMY  1970   EXPLORATORY STOMACH SURGERY FOR TEAR IN STOMACH WALL   EXTRACORPOREAL SHOCK WAVE LITHOTRIPSY Left 10/06/2019   Procedure: EXTRACORPOREAL SHOCK WAVE LITHOTRIPSY (ESWL);  Surgeon: Elisabeth Valli BIRCH, MD;  Location: [facility];  Service: Urology;  Laterality: Left;   EXTRACORPOREAL SHOCK WAVE LITHOTRIPSY  multiple since 2002,  last one 02-28-2013   HOLMIUM LASER APPLICATION Left 10/10/2013   Procedure: HOLMIUM LASER APPLICATION;  Surgeon: Senior CHRISTELLA Matilda, MD;  Location: [facility];  Service: Urology;  Laterality: Left;   KNEE ARTHROSCOPY  02/05/2011   Procedure: ARTHROSCOPY KNEE;  Surgeon: Lynwood SQUIBB [place];  Location: [facility];  Service: Orthopedics;  Laterality: Right;  Right knee arthroscopy partial lateral and medial meniscectomy with condyle debridement   LUMBAR SPINE SURGERY  x2  1995   ORIF ANKLE FRACTURE Right 1988   ROTATOR CUFF REPAIR Bilateral right 02/16/2018;  left 1997   TONSILLECTOMY AND ADENOIDECTOMY  1951   TOTAL KNEE ARTHROPLASTY Left 04/08/2019   Procedure: TOTAL KNEE ARTHROPLASTY;  Surgeon: Gerome Charleston, MD;  Location: [facility];  Service: Orthopedics;  Laterality: Left;  with adductor canal   Patient Active Problem List   Diagnosis Date Noted   Cellulitis 08/17/2023   Asthma exacerbation 04/22/2021   History of total  knee arthroplasty 04/08/2019   DOE (dyspnea on exertion) 11/09/2018   Cough variant asthma vs UACS  06/27/2016    PCP: Kip Righter, MD  REFERRING PROVIDER: Melita Drivers, MD  REFERRING DIAG:  Diagnosis  M25.511 (ICD-10-CM) - Pain in right shoulder  S/p Rt reverse total shoulder replacement   THERAPY DIAG:  Acute pain of right shoulder  Stiffness of right shoulder, not elsewhere classified  Cramp and spasm  Muscle weakness (generalized)  Abnormal  posture  Rationale for Evaluation and Treatment: Rehabilitation  ONSET DATE: surgery 08/11/23  SUBJECTIVE:                                                                                                                                                                                      SUBJECTIVE STATEMENT: Pt reports still no pain at rest. Sleeping better but still having trouble reaching overhead.    From eval: Pt presents to PT s/p Rt reverse total shoulder replacement performed 08/11/23.  [place] and had resultant torn rotator cuff and this lead to surgery. He is wearing sling just when out of the house.    Hand dominance: Right  PERTINENT HISTORY: Lumbar surgery x 2 in 1995, ORIF Rt ankle, TKA Lt 2021, fall in airport 06/2023 with concussion, rib fracture and Rt shoulder injury, Rt reverse TSA 08/11/23  PAIN:11/11/23 Are you having pain? Yes: NPRS scale: 0/10 at rest, up to/10 during movement Pain location: Rt shoulder  Pain description: stabbing, aching Aggravating factors: moving Rt UE into certain motions Relieving factors: pain meds, rest, ice  PRECAUTIONS: Other: follow post-op protocol  RED FLAGS: None   WEIGHT BEARING RESTRICTIONS: No  FALLS:  Has patient fallen in last 6 months? Yes. Number of falls prior to surgery- torn rotator cuff and labrum  LIVING ENVIRONMENT: Lives with: lives with their spouse Lives in: House/apartment Stairs: No Has following equipment at home: Single point cane    PLOF: Independent and Leisure: travel  PATIENT GOALS: return to prior level of function, use Rt UE   NEXT MD VISIT: 10/07/23  OBJECTIVE:  Note: Objective measures were completed at Evaluation unless otherwise noted.  DIAGNOSTIC FINDINGS:  NA  PATIENT SURVEYS :  Quick Dash Disability/Symptom Score:= 79.5% disability   10/19/23 Quick Dash  15.9 / 100 = 15.9 %  Minimally Clinically Important Difference (MCID): 15-20  points    COGNITION: Overall cognitive status: Within functional limits for tasks assessed     SENSATION: WFL  POSTURE: Forward head/rounded shoulders   UPPER EXTREMITY ROM:  11/02/23: functional flexion and ABD 70 deg in sitting/standing, ER hand to behind ear, must tilt head,  IR to back pocket,   Passive ROM Right eval Left eval Right  09/30/23 Right 10/19/23 Right 8/6 Right 11/02/23  Shoulder flexion 60 WFLs  105 109  117 120  Shoulder extension        Shoulder abduction 50  65 70  85  Shoulder adduction        Shoulder internal rotation 30       Shoulder external rotation 15  30 33 40 43  Elbow flexion        Elbow extension        Wrist flexion        Wrist extension        Wrist ulnar deviation        Wrist radial deviation        Wrist pronation        Wrist supination        (Blank rows = not tested)  UPPER EXTREMITY MMT: Not tested due to post-op restrictions  11/02/23 R shoulder flex/ABD 2+/5, ER 3-/5  JOINT MOBILITY TESTING:  NA- post op  PALPATION:  Healing surgical incision over Rt anterior aspect of shoulder.  Reduced mobility and tenderness                                                                                                                              TREATMENT DATE:  11/24/23 UBE x 6 min (3/3) Functional reach with 1 lb from counter to first shelf. X 10 (needed frequent rest breaks for recover to baseline) 3 way scapular stabilization with blue loop x 5 each side 4 D ball rolls x 10-20 each direction (could do all reps on some directions but could not complete all reps on some directions) Standing shoulder flexion with verbal and tactile cues with 0# 2 x 10 Supine PROM all planes, added yoga block for comfort on ER  Ice x 10 min to right shoulder at end of session  11/17/23 UBE x 6 min (3/3) Functional reach with light object (massage cream jar) from counter to first shelf. X 10 Standing in front of mirror for visual feedback : field  goal reaches x 10 Educated on STM using tennis or LAX ball behind on the wall x 2 min Standing shoulder extension and rows with green tband with handles 2 x 10 each Seated bilateral shoulder ER and horizontal abduction with yellow tband 2 x 10 each Doorway stretch for anterior shoulder x 10 hold 5 sec Shoulder ER stretch at doorway x 10 hold 5 sec each  Wall crawls at doorway right shoulder x 5 Seated with soft foam roller behind, vc's to pinch shoulder blades around roll with low range shoulder horizontal abduction. 2 x 10  11/11/23 UBE x 6 min (3/3)- SPT present for goal assessment and to provide cueing  Supine forward flexion bilaterally with dowel 2x10 for improving shoulder ROM Posterior pelvic tilts 2x10 for lumbopelvic stability Seated Lev scap stretch 2x10 Seated  ball press for TA activation 2x8 Standing rows with green theraband 2x10 Standing shoulder extension with red theraband 3x10 Dix Hallpike negative for nystagmus and dizziness on both sides    PATIENT EDUCATION: Education details: [email or web address], over the door pulleys, scar mobs on Rt   Person educated: Patient Education method: Explanation, Demonstration, and Handouts Education comprehension: verbalized understanding and returned demonstration  HOME EXERCISE PROGRAM: Access Code: [email or web address] URL: [email or web address] Date: 11/09/2023 Prepared by: Mliss  Exercises - Seated Shoulder Shrug Circles AROM Backward  - 5 x daily - 7 x weekly - 1 sets - 10 reps - Seated Shoulder Flexion Towel Slide at Table Top  - 2-3 x daily - 7 x weekly - 1 sets - 10 reps - Seated Elbow Flexion and Extension AROM  - 3 x daily - 7 x weekly - 1 sets - 10 reps - Supine Shoulder Flexion AAROM with Hands Clasped  - 1 x daily - 7 x weekly - 3 sets - 10 reps - Supine Shoulder External Rotation with Dowel  - 2 x daily - 7 x weekly - 1 sets - 10 reps - 10 hold - Isometric Shoulder Abduction at Wall  - 2 x daily - 7 x weekly - 2 sets -  5-10 reps - 5 hold - Standing Isometric Shoulder External Rotation with Doorway and Towel Roll  - 2 x daily - 7 x weekly - 2 sets - 5-10 reps - 5 hold - Sidelying Shoulder External Rotation AROM  - 1 x daily - 3 x weekly - 1-3 sets - 10 reps - Sidelying Shoulder Flexion 15 Degrees  - 1 x daily - 3-4 x weekly - 1-3 sets - 10 reps - 3 sec hold - Seated Gaze Stabilization with Head Rotation  - 3 x daily - 7 x weekly - Seated Gaze Stabilization with Head Nod  - 3 x daily - 7 x weekly - Standing Shoulder Row with Anchored Resistance  - 1 x daily - 3 x weekly - 2 sets - 10 reps - Shoulder extension with resistance - Neutral  - 1 x daily - 3 x weekly - 2 sets - 10 reps     ASSESSMENT:  CLINICAL IMPRESSION:  Charlena continues to progress slowly with functional gains.  He was able to do some light reaching from countertop to first shelf with 1 lb today but needed frequent rest breaks.   We will continue to focus on functional gains.  Suggested he use ice after each exercise session at home and before bed.   Pt will benefit from skilled PT to address the deficits below and moving closer to his goal related activities.   OBJECTIVE IMPAIRMENTS: decreased ROM, decreased strength, increased edema, increased fascial restrictions, impaired perceived functional ability, increased muscle spasms, impaired flexibility, impaired UE functional use, postural dysfunction, and pain.   ACTIVITY LIMITATIONS: carrying, lifting, dressing, self feeding, reach over head, and hygiene/grooming  PARTICIPATION LIMITATIONS: meal prep, cleaning, laundry, driving, community activity, and yard work  PERSONAL FACTORS: Age and 1 comorbidity: s/p Rt TSA are also affecting patient's functional outcome.   REHAB POTENTIAL: Good  CLINICAL DECISION MAKING: Stable/uncomplicated  EVALUATION COMPLEXITY: Low  GOALS: Goals reviewed with patient? Yes  SHORT TERM GOALS: Target date: 10/12/2023    Be independent in initial HEP Baseline:  has initial HEP (09/23/23) Goal status: Met      Demonstrate Rt shoulder P/ROM flexion to > or = to 100 degrees to promote overhead reaching  Baseline:  Goal status:  MET     Improve QuickDASH to < or = to 68% disability  Baseline: 79%  Goal status: MET   Wean from sling and report > or = to 30% use of Rt UE with ADLs and dressing  Baseline:  Goal status: MET 50%   Demonstrate Rt shoulder P/ROM ER to > or = to 45 degrees to improve reaching behind head  Baseline: 15 Goal status: IN PROGRESS 10/14/23    LONG TERM GOALS: Target date: 01/05/2024    Be independent in advanced HEP Baseline:  Goal status: IN PROGRESS  2.  Improve QuickDASH to < or = to 50% disability for use with functional tasks  Baseline: 79%  Goal status: MET 15.9 / 100 = 15.9 %    3.  Demonstrate Rt shoulder A/ROM flexion to > or = to 110 degrees for reaching overhead Baseline:  Goal status: IN PROGRESS 11/02/23 70 degrees in sitting/standing (120 deg PROM)  4.  Report > or = to 60% use of Rt UE with ADLs and self-care due to improve ROM and functional strength  Baseline:  Goal status: IN PROGRESS  11/02/23  50%  5.  Demonstrate Rt UE A/ROM ER to occiput to aid with self-care Baseline:  Goal status: IN PROGRESS  11/02/23  to behind ear    PLAN: PT FREQUENCY: 2x/week  PT DURATION: 8 weeks  PLANNED INTERVENTIONS: 97110-Therapeutic exercises, 97530- Therapeutic activity, 97112- Neuromuscular re-education, 97535- Self Care, [id number]- Manual therapy, [phone]- Canalith repositioning, [id number]- Aquatic Therapy, [id number]- Electrical stimulation (unattended), 20560 (1-2 muscles), 20561 (3+ muscles)- Dry Needling, Patient/Family education, Taping, Joint mobilization, Scar mobilization, Cryotherapy, and Moist heat  PLAN FOR NEXT SESSION: functional reaching exercises, progress per protocol, postural strengthening, work on strengthening for functional gains. continue Epley's treatment to left side as needed, trunk stability  exercises, shoulder mobility Ice at end of session  Candido Flott B. Sidney Kann, PT 11/24/23 4:23 PM [facility] [address], [address] [place], [place] [address] Phone # [phone] Fax [phone]

## 2023-11-26 ENCOUNTER — Ambulatory Visit

## 2023-11-26 DIAGNOSIS — M25511 Pain in right shoulder: Secondary | ICD-10-CM | POA: Diagnosis not present

## 2023-11-26 DIAGNOSIS — R293 Abnormal posture: Secondary | ICD-10-CM | POA: Diagnosis not present

## 2023-11-26 DIAGNOSIS — M6281 Muscle weakness (generalized): Secondary | ICD-10-CM

## 2023-11-26 DIAGNOSIS — M25611 Stiffness of right shoulder, not elsewhere classified: Secondary | ICD-10-CM

## 2023-11-26 DIAGNOSIS — R252 Cramp and spasm: Secondary | ICD-10-CM

## 2023-11-26 NOTE — Therapy (Addendum)
 OUTPATIENT PHYSICAL THERAPY TREATMENT NOTE  PHYSICAL THERAPY DISCHARGE SUMMARY  Visits from Start of Care: 21  Current functional level related to goals / functional outcomes: See below: patient was unable to return to PT due to medical complications   Remaining deficits: See below   Education / Equipment: See below   Patient agrees to discharge. Patient goals were partially met. Patient is being discharged due to a change in medical status.    Patient Name: Andrew Moran MRN: [id number] DOB:[birth year], 78 y.o., male Today's Date: 01/05/2024  END OF SESSION:        Past Medical History:  Diagnosis Date   Arthritis    Asthma    pulmology-- dr wert--- cough varient versus uacs   GERD (gastroesophageal reflux disease)    per pt takes DGL supplement   History of diverticulitis of colon 2002   managed medically , no surgical intervention   History of DVT of lower extremity 06/27/2019   followed by dr g. loni--  completed 3 months xarelto  07/ 2021   History of kidney stones    Renal calculi    left side   Past Surgical History:  Procedure Laterality Date   ANKLE HARDWARE REMOVAL Right 2008   CARPAL TUNNEL RELEASE Bilateral left 1983;  right 1984   CYSTOSCOPY WITH RETROGRADE PYELOGRAM, URETEROSCOPY AND STENT PLACEMENT Left 02/23/2020   Procedure: CYSTOSCOPY WITH RETROGRADE PYELOGRAM, URETEROSCOPY, LITHOTRIPSY, STONE EXTRACTION,  AND STENT PLACEMENT;  Surgeon: Matilda Senior, MD;  Location: [facility];  Service: Urology;  Laterality: Left;   CYSTOSCOPY/RETROGRADE/URETEROSCOPY/STONE EXTRACTION WITH BASKET Bilateral 10/10/2013   Procedure: CYSTOSCOPY/URETEROSCOPY/STONE EXTRACTION WITH HOLMIUM LASER, bilateral retrograde, bilateral ureteral stents;  Surgeon: Senior CHRISTELLA Matilda, MD;  Location: [facility];  Service: Urology;  Laterality: Bilateral;   EXPLORATORY LAPAROTOMY  1970   EXPLORATORY STOMACH SURGERY FOR TEAR IN STOMACH WALL   EXTRACORPOREAL  SHOCK WAVE LITHOTRIPSY Left 10/06/2019   Procedure: EXTRACORPOREAL SHOCK WAVE LITHOTRIPSY (ESWL);  Surgeon: Elisabeth Valli BIRCH, MD;  Location: [facility];  Service: Urology;  Laterality: Left;   EXTRACORPOREAL SHOCK WAVE LITHOTRIPSY  multiple since 2002,  last one 02-28-2013   HOLMIUM LASER APPLICATION Left 10/10/2013   Procedure: HOLMIUM LASER APPLICATION;  Surgeon: Senior CHRISTELLA Matilda, MD;  Location: [facility];  Service: Urology;  Laterality: Left;   KNEE ARTHROSCOPY  02/05/2011   Procedure: ARTHROSCOPY KNEE;  Surgeon: Lynwood SQUIBB [place];  Location: [facility];  Service: Orthopedics;  Laterality: Right;  Right knee arthroscopy partial lateral and medial meniscectomy with condyle debridement   LUMBAR SPINE SURGERY  x2  1995   ORIF ANKLE FRACTURE Right 1988   ROTATOR CUFF REPAIR Bilateral right 02/16/2018;  left 1997   TONSILLECTOMY AND ADENOIDECTOMY  1951   TOTAL KNEE ARTHROPLASTY Left 04/08/2019   Procedure: TOTAL KNEE ARTHROPLASTY;  Surgeon: Gerome Charleston, MD;  Location: [facility];  Service: Orthopedics;  Laterality: Left;  with adductor canal   Patient Active Problem List   Diagnosis Date Noted   Cellulitis 08/17/2023   Asthma exacerbation 04/22/2021   History of total knee arthroplasty 04/08/2019   DOE (dyspnea on exertion) 11/09/2018   Cough variant asthma vs UACS  06/27/2016    PCP: Kip Righter, MD  REFERRING PROVIDER: Melita Drivers, MD  REFERRING DIAG:  Diagnosis  M25.511 (ICD-10-CM) - Pain in right shoulder  S/p Rt reverse total shoulder replacement   THERAPY DIAG:  Acute pain of right shoulder  Abnormal posture  Stiffness of right shoulder, not elsewhere classified  Cramp and spasm  Muscle weakness (generalized)  Rationale for Evaluation and Treatment: Rehabilitation  ONSET DATE: surgery 08/11/23  SUBJECTIVE:                                                                                                                                                                                       SUBJECTIVE STATEMENT: Pt reports he feels some stabbing in the right shoulder from last session, but knows it needs to be moved to get 'unstuck'. Pt states it didn't ache, and he put ice on it that helped resolved the pain.    From eval: Pt presents to PT s/p Rt reverse total shoulder replacement performed 08/11/23.  [place] the [place] and had resultant torn rotator cuff and this lead to surgery. He is wearing sling just when out of the house.    Hand dominance: Right  PERTINENT HISTORY: Lumbar surgery x 2 in 1995, ORIF Rt ankle, TKA Lt 2021, fall in airport 06/2023 with concussion, rib fracture and Rt shoulder injury, Rt reverse TSA 08/11/23  PAIN:11/11/23 Are you having pain? Yes: NPRS scale: 0/10 at rest, up to/10 during movement Pain location: Rt shoulder  Pain description: stabbing, aching Aggravating factors: moving Rt UE into certain motions Relieving factors: pain meds, rest, ice  PRECAUTIONS: Other: follow post-op protocol  RED FLAGS: None   WEIGHT BEARING RESTRICTIONS: No  FALLS:  Has patient fallen in last 6 months? Yes. Number of falls prior to surgery- torn rotator cuff and labrum  LIVING ENVIRONMENT: Lives with: lives with their spouse Lives in: House/apartment Stairs: No Has following equipment at home: Single point cane    PLOF: Independent and Leisure: travel  PATIENT GOALS: return to prior level of function, use Rt UE   NEXT MD VISIT: 10/07/23  OBJECTIVE:  Note: Objective measures were completed at Evaluation unless otherwise noted.  DIAGNOSTIC FINDINGS:  NA  PATIENT SURVEYS :  Quick Dash Disability/Symptom Score:= 79.5% disability   10/19/23 Quick Dash  15.9 / 100 = 15.9 %  Minimally Clinically Important Difference (MCID): 15-20 points    COGNITION: Overall cognitive status: Within functional limits for tasks assessed     SENSATION: WFL  POSTURE: Forward head/rounded shoulders   UPPER  EXTREMITY ROM:  11/02/23: functional flexion and ABD 70 deg in sitting/standing, ER hand to behind ear, must tilt head, IR to back pocket,   Passive ROM Right eval Left eval Right  09/30/23 Right 10/19/23 Right 8/6 Right 11/02/23  Shoulder flexion 60 WFLs  105 109  117 120  Shoulder extension        Shoulder abduction 50  65 70  85  Shoulder adduction        Shoulder internal rotation 30       Shoulder external rotation 15  30 33 40 43  Elbow flexion        Elbow extension        Wrist flexion        Wrist extension        Wrist ulnar deviation        Wrist radial deviation        Wrist pronation        Wrist supination        (Blank rows = not tested)  UPPER EXTREMITY MMT: Not tested due to post-op restrictions  11/02/23 R shoulder flex/ABD 2+/5, ER 3-/5  JOINT MOBILITY TESTING:  NA- post op  PALPATION:  Healing surgical incision over Rt anterior aspect of shoulder.  Reduced mobility and tenderness                                                                                                                              TREATMENT DATE:   11/26/23 Nustep at level 3 for 6 min- SPT/PT present to discuss pt status AROM shoulder retraction 2x10 Standing contract/relax for shoulder flexion/ext 2x8 Functional reach with 1 lb from counter to first shelf. X 10 (needed frequent rest breaks for recover to baseline) Shoulder flexion slides up wall with towel for mobility x15 Standing rows yellow band 3x10 Manual therapy- anterior/posterior/Inferior scapular mobilizations, pec minor release, transverse friction massage around scar tissue area Supine contract/ relax for shoulder IR/ER 8-10 min Standing pec minor stretch in doorway 2x30 sec    11/24/23 UBE x 6 min (3/3) Functional reach with 1 lb from counter to first shelf. X 10 (needed frequent rest breaks for recover to baseline) 3 way scapular stabilization with blue loop x 5 each side 4 D ball rolls x 10-20 each direction (could  do all reps on some directions but could not complete all reps on some directions) Standing shoulder flexion with verbal and tactile cues with 0# 2 x 10 Supine PROM all planes, added yoga block for comfort on ER  Ice x 10 min to right shoulder at end of session  11/17/23 UBE x 6 min (3/3) Functional reach with light object (massage cream jar) from counter to first shelf. X 10 Standing in front of mirror for visual feedback : field goal reaches x 10 Educated on STM using tennis or LAX ball behind on the wall x 2 min Standing shoulder extension and rows with green tband with handles 2 x 10 each Seated bilateral shoulder ER and horizontal abduction with yellow tband 2 x 10 each Doorway stretch for anterior shoulder x 10 hold 5 sec Shoulder ER stretch at doorway x 10 hold 5 sec each  Wall crawls at doorway right shoulder x 5 Seated with soft foam roller behind, vc's to pinch shoulder blades around roll with low range shoulder horizontal abduction. 2  x 10  11/11/23 UBE x 6 min (3/3)- SPT present for goal assessment and to provide cueing  Supine forward flexion bilaterally with dowel 2x10 for improving shoulder ROM Posterior pelvic tilts 2x10 for lumbopelvic stability Seated Lev scap stretch 2x10 Seated ball press for TA activation 2x8 Standing rows with green theraband 2x10 Standing shoulder extension with red theraband 3x10 Dix Hallpike negative for nystagmus and dizziness on both sides    PATIENT EDUCATION: Education details: [email or web address], over the door pulleys, scar mobs on Rt   Person educated: Patient Education method: Explanation, Demonstration, and Handouts Education comprehension: verbalized understanding and returned demonstration  HOME EXERCISE PROGRAM: Access Code: [email or web address] URL: [email or web address] Date: 11/09/2023 Prepared by: Mliss  Exercises - Seated Shoulder Shrug Circles AROM Backward  - 5 x daily - 7 x weekly - 1 sets - 10 reps - Seated Shoulder Flexion  Towel Slide at Table Top  - 2-3 x daily - 7 x weekly - 1 sets - 10 reps - Seated Elbow Flexion and Extension AROM  - 3 x daily - 7 x weekly - 1 sets - 10 reps - Supine Shoulder Flexion AAROM with Hands Clasped  - 1 x daily - 7 x weekly - 3 sets - 10 reps - Supine Shoulder External Rotation with Dowel  - 2 x daily - 7 x weekly - 1 sets - 10 reps - 10 hold - Isometric Shoulder Abduction at Wall  - 2 x daily - 7 x weekly - 2 sets - 5-10 reps - 5 hold - Standing Isometric Shoulder External Rotation with Doorway and Towel Roll  - 2 x daily - 7 x weekly - 2 sets - 5-10 reps - 5 hold - Sidelying Shoulder External Rotation AROM  - 1 x daily - 3 x weekly - 1-3 sets - 10 reps - Sidelying Shoulder Flexion 15 Degrees  - 1 x daily - 3-4 x weekly - 1-3 sets - 10 reps - 3 sec hold - Seated Gaze Stabilization with Head Rotation  - 3 x daily - 7 x weekly - Seated Gaze Stabilization with Head Nod  - 3 x daily - 7 x weekly - Standing Shoulder Row with Anchored Resistance  - 1 x daily - 3 x weekly - 2 sets - 10 reps - Shoulder extension with resistance - Neutral  - 1 x daily - 3 x weekly - 2 sets - 10 reps     ASSESSMENT:  CLINICAL IMPRESSION:    Andrew Moran presents to skilled therapy with complaints of 'stabbing' pain intermittently last few days in right shoulder, however also states he had increase mobility since last session. Today's session focused on shoulder flexion and ER ROM. Pt still presents with severe right shoulder ER muscle weakness. He responded well to the contract/relax exercises, able to move further through the joint ROM. Pt received manual therapy up until the point of almost having pain, denying ice at end of session. Pt will benefit from continuing skilled therapy to address the deficits below and move closer to his goal related activities.    OBJECTIVE IMPAIRMENTS: decreased ROM, decreased strength, increased edema, increased fascial restrictions, impaired perceived functional ability, increased  muscle spasms, impaired flexibility, impaired UE functional use, postural dysfunction, and pain.   ACTIVITY LIMITATIONS: carrying, lifting, dressing, self feeding, reach over head, and hygiene/grooming  PARTICIPATION LIMITATIONS: meal prep, cleaning, laundry, driving, community activity, and yard work  PERSONAL FACTORS: Age and 1 comorbidity: s/p Rt TSA are also affecting patient's functional outcome.  REHAB POTENTIAL: Good  CLINICAL DECISION MAKING: Stable/uncomplicated  EVALUATION COMPLEXITY: Low  GOALS: Goals reviewed with patient? Yes  SHORT TERM GOALS: Target date: 10/12/2023    Be independent in initial HEP Baseline: has initial HEP (09/23/23) Goal status: Met      Demonstrate Rt shoulder P/ROM flexion to > or = to 100 degrees to promote overhead reaching  Baseline:  Goal status: MET     Improve QuickDASH to < or = to 68% disability  Baseline: 79%  Goal status: MET   Wean from sling and report > or = to 30% use of Rt UE with ADLs and dressing  Baseline:  Goal status: MET 50%   Demonstrate Rt shoulder P/ROM ER to > or = to 45 degrees to improve reaching behind head  Baseline: 15 Goal status: IN PROGRESS 10/14/23    LONG TERM GOALS: Target date: 01/05/2024    Be independent in advanced HEP Baseline:  Goal status: IN PROGRESS  2.  Improve QuickDASH to < or = to 50% disability for use with functional tasks  Baseline: 79%  Goal status: MET 15.9 / 100 = 15.9 %    3.  Demonstrate Rt shoulder A/ROM flexion to > or = to 110 degrees for reaching overhead Baseline:  Goal status: IN PROGRESS 11/02/23 70 degrees in sitting/standing (120 deg PROM)  4.  Report > or = to 60% use of Rt UE with ADLs and self-care due to improve ROM and functional strength  Baseline:  Goal status: IN PROGRESS  11/02/23  50%  5.  Demonstrate Rt UE A/ROM ER to occiput to aid with self-care Baseline:  Goal status: IN PROGRESS  11/02/23  to behind ear    PLAN: PT FREQUENCY: 2x/week  PT  DURATION: 8 weeks  PLANNED INTERVENTIONS: 97110-Therapeutic exercises, 97530- Therapeutic activity, 97112- Neuromuscular re-education, 97535- Self Care, 02859- Manual therapy, [phone]- Canalith repositioning, [id number]- Aquatic Therapy, G0283- Electrical stimulation (unattended), 20560 (1-2 muscles), 20561 (3+ muscles)- Dry Needling, Patient/Family education, Taping, Joint mobilization, Scar mobilization, Cryotherapy, and Moist heat  PLAN FOR NEXT SESSION: functional reaching exercises, progress per protocol, postural strengthening, work on strengthening for functional gains. continue Epley's treatment to left side as needed, trunk stability exercises, shoulder mobility Ice at end of session, contract/relax, scapular mobes Lavanda Cleverly, SPT 01/05/24 9:10 AM  I have reviewed and concur with this student's documentation.   Delon KATHEE Haddock, PT 01/05/2024 9:10 AM  [facility] [address] [place], [place] [address] Phone # [phone] Fax [phone]

## 2023-11-28 ENCOUNTER — Emergency Department (HOSPITAL_BASED_OUTPATIENT_CLINIC_OR_DEPARTMENT_OTHER)
Admission: EM | Admit: 2023-11-28 | Discharge: 2023-11-28 | Disposition: A | Discharge status: Home / Self Care | Attending: Emergency Medicine | Admitting: Emergency Medicine

## 2023-11-28 ENCOUNTER — Emergency Department (HOSPITAL_BASED_OUTPATIENT_CLINIC_OR_DEPARTMENT_OTHER)

## 2023-11-28 ENCOUNTER — Other Ambulatory Visit: Payer: Self-pay

## 2023-11-28 ENCOUNTER — Encounter (HOSPITAL_BASED_OUTPATIENT_CLINIC_OR_DEPARTMENT_OTHER): Payer: Self-pay

## 2023-11-28 ENCOUNTER — Emergency Department (HOSPITAL_BASED_OUTPATIENT_CLINIC_OR_DEPARTMENT_OTHER): Admitting: Radiology

## 2023-11-28 DIAGNOSIS — I2510 Atherosclerotic heart disease of native coronary artery without angina pectoris: Secondary | ICD-10-CM | POA: Diagnosis not present

## 2023-11-28 DIAGNOSIS — M549 Dorsalgia, unspecified: Secondary | ICD-10-CM | POA: Diagnosis not present

## 2023-11-28 DIAGNOSIS — I2699 Other pulmonary embolism without acute cor pulmonale: Secondary | ICD-10-CM

## 2023-11-28 DIAGNOSIS — R7989 Other specified abnormal findings of blood chemistry: Secondary | ICD-10-CM | POA: Diagnosis not present

## 2023-11-28 DIAGNOSIS — M25512 Pain in left shoulder: Secondary | ICD-10-CM | POA: Diagnosis not present

## 2023-11-28 DIAGNOSIS — R918 Other nonspecific abnormal finding of lung field: Secondary | ICD-10-CM | POA: Diagnosis not present

## 2023-11-28 DIAGNOSIS — R9431 Abnormal electrocardiogram [ECG] [EKG]: Secondary | ICD-10-CM | POA: Diagnosis not present

## 2023-11-28 DIAGNOSIS — R071 Chest pain on breathing: Secondary | ICD-10-CM | POA: Diagnosis not present

## 2023-11-28 DIAGNOSIS — M546 Pain in thoracic spine: Secondary | ICD-10-CM | POA: Diagnosis not present

## 2023-11-28 DIAGNOSIS — Z96611 Presence of right artificial shoulder joint: Secondary | ICD-10-CM | POA: Diagnosis not present

## 2023-11-28 DIAGNOSIS — R0602 Shortness of breath: Secondary | ICD-10-CM | POA: Diagnosis not present

## 2023-11-28 HISTORY — DX: Other pulmonary embolism without acute cor pulmonale: I26.99

## 2023-11-28 LAB — CBC
HCT: 37.6 % — ABNORMAL LOW (ref 39.0–52.0)
Hemoglobin: 12.6 g/dL — ABNORMAL LOW (ref 13.0–17.0)
MCH: 29.3 pg (ref 26.0–34.0)
MCHC: 33.5 g/dL (ref 30.0–36.0)
MCV: 87.4 fL (ref 80.0–100.0)
Platelets: 261 K/uL (ref 150–400)
RBC: 4.3 MIL/uL (ref 4.22–5.81)
RDW: 14 % (ref 11.5–15.5)
WBC: 10.1 K/uL (ref 4.0–10.5)
nRBC: 0 % (ref 0.0–0.2)

## 2023-11-28 LAB — BASIC METABOLIC PANEL WITH GFR
Anion gap: 13 (ref 5–15)
BUN: 15 mg/dL (ref 8–23)
CO2: 22 mmol/L (ref 22–32)
Calcium: 9.4 mg/dL (ref 8.9–10.3)
Chloride: 107 mmol/L (ref 98–111)
Creatinine, Ser: 0.84 mg/dL (ref 0.61–1.24)
GFR, Estimated: >60 mL/min (ref >60)
Glucose, Bld: 107 mg/dL — ABNORMAL HIGH (ref 70–99)
Potassium: 4.1 mmol/L (ref 3.5–5.1)
Sodium: 141 mmol/L (ref 135–145)

## 2023-11-28 LAB — TROPONIN T, HIGH SENSITIVITY
Troponin T High Sensitivity: <15 ng/L (ref 0–19)
Troponin T High Sensitivity: <15 ng/L (ref 0–19)

## 2023-11-28 MED ORDER — IOHEXOL 350 MG/ML SOLN
75.0000 mL | Freq: Once | INTRAVENOUS | Status: AC | PRN
  Administered 2023-11-28: 75 mL via INTRAVENOUS

## 2023-11-28 MED ORDER — RIVAROXABAN (XARELTO) VTE STARTER PACK (15 & 20 MG)
15.0000 mg | ORAL_TABLET | Freq: Two times a day (BID) | ORAL | 0 refills | Status: DC
Start: 2023-11-28 — End: 2024-01-07

## 2023-11-28 NOTE — ED Triage Notes (Signed)
 Arrives POV with complaints of back/scapular pain, shortness of breath with inhalation, and an abnormal EKG today and his PCP office. Sent here for further cardiac evaluation.

## 2023-11-28 NOTE — ED Notes (Signed)
 ED Provider at bedside.

## 2023-11-28 NOTE — ED Notes (Signed)
 Reviewed discharge instructions, medications, and home care with pt. Pt verbalized understanding and had no further questions. Pt exited ED without complications.

## 2023-11-28 NOTE — ED Provider Notes (Signed)
 [place] EMERGENCY DEPARTMENT AT [facility] Provider Note   CSN: [id number] Arrival date & time: 11/28/23  1230     Patient presents with: Abnormal ECG and Shortness of Breath   Andrew Moran is a 78 y.o. male.   HPI   78 year old male presents emergency department with left upper back/scapular pain, shortness of breath and pain with deep inspiration.  Patient is status post shoulder surgery at the end of May.  Developed upper back and scapular pain with pleuritic chest pain over the past couple days, was seen at urgent care and they had concern for an abnormal EKG and sent him here for further evaluation.  Of note patient does have history of provoked DVT in the lower extremity after knee surgery.  He has no unilateral leg swelling at this time.  Denies any anterior chest pain.  No recent fever or other acute symptoms.  Prior to Admission medications   Medication Sig Start Date End Date Taking? Authorizing Provider  acetaminophen  (TYLENOL ) 500 MG tablet Take 500-1,000 mg by mouth every 6 (six) hours as needed for moderate pain (pain score 4-6).    [provider]  albuterol  (VENTOLIN  HFA) 108 (90 Base) MCG/ACT inhaler Inhale 2 puffs into the lungs every 6 (six) hours as needed for wheezing or shortness of breath.    [provider]  atorvastatin  (LIPITOR) 20 MG tablet Take 20 mg by mouth daily.    [provider]  budesonide -glycopyrrolate-formoterol  (BREZTRI  AEROSPHERE) 160-9-4.8 MCG/ACT AERO inhaler Inhale 2 puffs into the lungs in the morning and at bedtime. 08/28/23   Neysa Reggy BIRCH, MD  budesonide -glycopyrrolate-formoterol  (BREZTRI  AEROSPHERE) 160-9-4.8 MCG/ACT AERO inhaler Inhale 2 puffs into the lungs in the morning and at bedtime. 10/23/23   Parrett, Madelin RAMAN, NP  budesonide -glycopyrrolate-formoterol  (BREZTRI  AEROSPHERE) 160-9-4.8 MCG/ACT AERO inhaler Inhale 2 puffs into the lungs in the morning and at bedtime. 10/23/23   Parrett, Madelin RAMAN, NP   Multiple Vitamins-Minerals (MULTIVITAMINS THER. W/MINERALS) TABS Take 1 tablet by mouth daily.    [provider]  OVER THE COUNTER MEDICATION Take 2 tablets by mouth at bedtime. Deglycyrrhizinated-DGL    [provider]    Allergies: Zithromax [azithromycin], Betadine  [povidone iodine ], and Wound dressing adhesive    Review of Systems  Constitutional:  Positive for fatigue. Negative for fever.  Respiratory:  Positive for shortness of breath.   Cardiovascular:  Positive for chest pain. Negative for palpitations and leg swelling.  Gastrointestinal:  Negative for abdominal pain, diarrhea and vomiting.  Musculoskeletal:  Positive for back pain.  Skin:  Negative for rash.  Neurological:  Negative for headaches.    Updated Vital Signs BP 110/73   Pulse 74   Temp 98.1 F (36.7 C) (Oral)   Resp 19   Ht 5' 5 (1.651 m)   Wt 77.1 kg   SpO2 99%   BMI 28.29 kg/m   Physical Exam Vitals and nursing note reviewed.  Constitutional:      General: He is not in acute distress.    Appearance: Normal appearance.  HENT:     Head: Normocephalic.     Mouth/Throat:     Mouth: Mucous membranes are moist.  Cardiovascular:     Rate and Rhythm: Normal rate.  Pulmonary:     Effort: Pulmonary effort is normal. No tachypnea or respiratory distress.     Breath sounds: No decreased breath sounds.  Chest:     Chest wall: No tenderness.  Abdominal:     Palpations:  Abdomen is soft.     Tenderness: There is no abdominal tenderness.  Musculoskeletal:     Right lower leg: No edema.     Left lower leg: No edema.  Skin:    General: Skin is warm.  Neurological:     Mental Status: He is alert and oriented to person, place, and time. Mental status is at baseline.  Psychiatric:        Mood and Affect: Mood normal.     (all labs ordered are listed, but only abnormal results are displayed) Labs Reviewed  BASIC METABOLIC PANEL WITH GFR - Abnormal; Notable for the following  components:      Result Value   Glucose, Bld 107 (*)    All other components within normal limits  CBC - Abnormal; Notable for the following components:   Hemoglobin 12.6 (*)    HCT 37.6 (*)    All other components within normal limits  TROPONIN T, HIGH SENSITIVITY  TROPONIN T, HIGH SENSITIVITY    EKG: EKG Interpretation Date/Time:  Saturday November 28 2023 12:39:50 EDT Ventricular Rate:  89 PR Interval:  142 QRS Duration:  102 QT Interval:  361 QTC Calculation: 440 R Axis:   155  Text Interpretation: Sinus rhythm Right axis deviation Low voltage, precordial leads Confirmed by Bari Flank [phone]) on 11/28/2023 12:53:37 PM  Radiology: [facility] Chest 2 View Result Date: 11/28/2023 CLINICAL DATA:  Back pain and shortness of breath EXAM: CHEST - 2 VIEW COMPARISON:  10/23/2023 FINDINGS: Reverse right shoulder arthroplasty. Old healed fracture of the left distal clavicle. Heart size within normal limits Minimally blunted left costophrenic angle could reflect a trace pleural effusion. Mild thoracic spondylosis. IMPRESSION: 1. Minimally blunted left costophrenic angle could reflect a trace pleural effusion. 2. Mild thoracic spondylosis. 3. Old healed fracture of the left distal clavicle. Electronically Signed   By: Ryan Salvage M.D.   On: 11/28/2023 13:25     Procedures   Medications Ordered in the ED - No data to display                                  Medical Decision Making Amount and/or Complexity of Data Reviewed Labs: ordered. Radiology: ordered.   78 year old male presents emergency department with left upper back/scapular pain associated with pleuritic upper back pain.  Sent here for concern of abnormal EKG.  EKG here does not look acutely changed from previous, there are some subtle changes, no STEMI.  He is laying in bed, appears comfortable, normal vitals.  Recent surgery in May, just about over 3 months ago.  With his history of provoked DVT from previous surgery  Main concern would be something like a PE.  Blood work is reassuring, troponin is negative, chest x-ray shows mild costophrenic angle blunting on the left, this could be contributing to his pain pattern.  However we will plan to move forward with CT PE study for further evaluation.  Patient signed out pending CT PE study.     Final diagnoses:  None    ED Discharge Orders     None          Bari Flank HERO, DO 11/28/23 1543

## 2023-11-28 NOTE — Discharge Instructions (Addendum)
 You have been seen and discharged from the emergency department.  You were diagnosed with bilateral pulmonary emboli in the upper lung.  There was no sign of heart compromise or strain.  Please start the anticoagulation medicine, Xarelto .  You have been prescribed a starter pack.  Take as directed.  Follow-up with your primary provider for further evaluation and further care. Take home medications as prescribed. If you have any worsening symptoms or further concerns for your health please return to an emergency department for further evaluation.

## 2023-11-29 ENCOUNTER — Other Ambulatory Visit: Payer: Self-pay

## 2023-11-29 ENCOUNTER — Emergency Department (HOSPITAL_COMMUNITY)
Admission: EM | Admit: 2023-11-29 | Discharge: 2023-11-29 | Disposition: A | Discharge status: Home / Self Care | Attending: Emergency Medicine | Admitting: Emergency Medicine

## 2023-11-29 DIAGNOSIS — J45909 Unspecified asthma, uncomplicated: Secondary | ICD-10-CM | POA: Diagnosis not present

## 2023-11-29 DIAGNOSIS — R310 Gross hematuria: Secondary | ICD-10-CM | POA: Diagnosis not present

## 2023-11-29 DIAGNOSIS — Z7901 Long term (current) use of anticoagulants: Secondary | ICD-10-CM | POA: Diagnosis not present

## 2023-11-29 DIAGNOSIS — R319 Hematuria, unspecified: Secondary | ICD-10-CM | POA: Diagnosis present

## 2023-11-29 DIAGNOSIS — Z96652 Presence of left artificial knee joint: Secondary | ICD-10-CM | POA: Insufficient documentation

## 2023-11-29 LAB — URINALYSIS, ROUTINE W REFLEX MICROSCOPIC
Bacteria, UA: NONE SEEN
Bilirubin Urine: NEGATIVE
Glucose, UA: NEGATIVE mg/dL
Ketones, ur: NEGATIVE mg/dL
Leukocytes,Ua: NEGATIVE
Nitrite: NEGATIVE
Protein, ur: 100 mg/dL — AB
RBC / HPF: >50 RBC/hpf (ref 0–5)
Specific Gravity, Urine: 1.018 (ref 1.005–1.030)
WBC, UA: >50 WBC/hpf (ref 0–5)
pH: 7 (ref 5.0–8.0)

## 2023-11-29 LAB — CBC
HCT: 39.9 % (ref 39.0–52.0)
Hemoglobin: 12.5 g/dL — ABNORMAL LOW (ref 13.0–17.0)
MCH: 28.5 pg (ref 26.0–34.0)
MCHC: 31.3 g/dL (ref 30.0–36.0)
MCV: 91.1 fL (ref 80.0–100.0)
Platelets: 284 K/uL (ref 150–400)
RBC: 4.38 MIL/uL (ref 4.22–5.81)
RDW: 13.8 % (ref 11.5–15.5)
WBC: 11.3 K/uL — ABNORMAL HIGH (ref 4.0–10.5)
nRBC: 0 % (ref 0.0–0.2)

## 2023-11-29 LAB — BASIC METABOLIC PANEL WITH GFR
Anion gap: 12 (ref 5–15)
BUN: 14 mg/dL (ref 8–23)
CO2: 23 mmol/L (ref 22–32)
Calcium: 8.9 mg/dL (ref 8.9–10.3)
Chloride: 105 mmol/L (ref 98–111)
Creatinine, Ser: 0.89 mg/dL (ref 0.61–1.24)
GFR, Estimated: >60 mL/min (ref >60)
Glucose, Bld: 104 mg/dL — ABNORMAL HIGH (ref 70–99)
Potassium: 4.3 mmol/L (ref 3.5–5.1)
Sodium: 140 mmol/L (ref 135–145)

## 2023-11-29 NOTE — Discharge Instructions (Signed)
 Continue the blood thinner now.  Watch for increased bleeding.  Watch for difficulty urinating.  Follow-up with urology.

## 2023-11-29 NOTE — ED Provider Notes (Signed)
 [facility] [facility] AT [facility] Provider Note   CSN: [id number] Arrival date & time: 11/29/23  1810     Patient presents with: Hematuria   Andrew Moran is a 78 y.o. male.    Hematuria  Patient presents with hematuria.  Yesterday seen in the ER for right back pain diagnosed with pulmonary embolism.  Started on Xarelto .  Today began to have hematuria.  Initially red blood in the urine then became darker.  Still able to urinate.  Has had kidney stones previously.  No lightheadedness or dizziness.    Past Medical History:  Diagnosis Date   Arthritis    Asthma    pulmology-- dr wert--- cough varient versus uacs   GERD (gastroesophageal reflux disease)    per pt takes DGL supplement   History of diverticulitis of colon 2002   managed medically , no surgical intervention   History of DVT of lower extremity 06/27/2019   followed by dr g. loni--  completed 3 months xarelto  07/ 2021   History of kidney stones    Renal calculi    left side    Past Surgical History:  Procedure Laterality Date   ANKLE HARDWARE REMOVAL Right 2008   CARPAL TUNNEL RELEASE Bilateral left 1983;  right 1984   CYSTOSCOPY WITH RETROGRADE PYELOGRAM, URETEROSCOPY AND STENT PLACEMENT Left 02/23/2020   Procedure: CYSTOSCOPY WITH RETROGRADE PYELOGRAM, URETEROSCOPY, LITHOTRIPSY, STONE EXTRACTION,  AND STENT PLACEMENT;  Surgeon: Matilda Senior, MD;  Location: [facility];  Service: Urology;  Laterality: Left;   CYSTOSCOPY/RETROGRADE/URETEROSCOPY/STONE EXTRACTION WITH BASKET Bilateral 10/10/2013   Procedure: CYSTOSCOPY/URETEROSCOPY/STONE EXTRACTION WITH HOLMIUM LASER, bilateral retrograde, bilateral ureteral stents;  Surgeon: Senior CHRISTELLA Matilda, MD;  Location: [facility];  Service: Urology;  Laterality: Bilateral;   EXPLORATORY LAPAROTOMY  1970   EXPLORATORY STOMACH SURGERY FOR TEAR IN STOMACH WALL   EXTRACORPOREAL SHOCK WAVE LITHOTRIPSY Left 10/06/2019   Procedure:  EXTRACORPOREAL SHOCK WAVE LITHOTRIPSY (ESWL);  Surgeon: Elisabeth Valli BIRCH, MD;  Location: [facility];  Service: Urology;  Laterality: Left;   EXTRACORPOREAL SHOCK WAVE LITHOTRIPSY  multiple since 2002,  last one 02-28-2013   HOLMIUM LASER APPLICATION Left 10/10/2013   Procedure: HOLMIUM LASER APPLICATION;  Surgeon: Senior CHRISTELLA Matilda, MD;  Location: [facility];  Service: Urology;  Laterality: Left;   KNEE ARTHROSCOPY  02/05/2011   Procedure: ARTHROSCOPY KNEE;  Surgeon: Lynwood SQUIBB Aplington;  Location: [facility];  Service: Orthopedics;  Laterality: Right;  Right knee arthroscopy partial lateral and medial meniscectomy with condyle debridement   LUMBAR SPINE SURGERY  x2  1995   ORIF ANKLE FRACTURE Right 1988   ROTATOR CUFF REPAIR Bilateral right 02/16/2018;  left 1997   TONSILLECTOMY AND ADENOIDECTOMY  1951   TOTAL KNEE ARTHROPLASTY Left 04/08/2019   Procedure: TOTAL KNEE ARTHROPLASTY;  Surgeon: Gerome Charleston, MD;  Location: [facility];  Service: Orthopedics;  Laterality: Left;  with adductor canal     Prior to Admission medications   Medication Sig Start Date End Date Taking? Authorizing Provider  acetaminophen  (TYLENOL ) 500 MG tablet Take 500-1,000 mg by mouth every 6 (six) hours as needed for moderate pain (pain score 4-6).    [provider]  albuterol  (VENTOLIN  HFA) 108 (90 Base) MCG/ACT inhaler Inhale 2 puffs into the lungs every 6 (six) hours as needed for wheezing or shortness of breath.    [provider]  atorvastatin  (LIPITOR) 20 MG tablet Take 20 mg by mouth daily.    [provider]  budesonide -glycopyrrolate-formoterol  (BREZTRI  AEROSPHERE) 160-9-4.8 MCG/ACT AERO inhaler Inhale 2 puffs into the lungs in the morning and at bedtime. 08/28/23   Neysa Reggy BIRCH, MD  budesonide -glycopyrrolate-formoterol  (BREZTRI  AEROSPHERE) 160-9-4.8 MCG/ACT AERO inhaler Inhale 2 puffs into the lungs in the morning and at bedtime. 10/23/23   Parrett, Madelin RAMAN, NP   budesonide -glycopyrrolate-formoterol  (BREZTRI  AEROSPHERE) 160-9-4.8 MCG/ACT AERO inhaler Inhale 2 puffs into the lungs in the morning and at bedtime. 10/23/23   Parrett, Madelin RAMAN, NP  Multiple Vitamins-Minerals (MULTIVITAMINS THER. W/MINERALS) TABS Take 1 tablet by mouth daily.    [provider]  OVER THE COUNTER MEDICATION Take 2 tablets by mouth at bedtime. Deglycyrrhizinated-DGL    [provider]  RIVAROXABAN  (XARELTO ) VTE STARTER PACK (15 & 20 MG) Take 15 mg by mouth 2 (two) times daily. Follow package directions: Take one 15mg  tablet by mouth twice a day. On day 22, switch to one 20mg  tablet once a day. Take with food. 11/28/23   Horton, Roxie HERO, DO    Allergies: Zithromax [azithromycin], Betadine  [povidone iodine ], and Wound dressing adhesive    Review of Systems  Genitourinary:  Positive for hematuria.    Updated Vital Signs BP (!) 150/90   Pulse 90   Temp 99.8 F (37.7 C) (Oral)   Resp 10   Ht 5' 5 (1.651 m)   Wt 77.1 kg   SpO2 98%   BMI 28.29 kg/m   Physical Exam Vitals and nursing note reviewed.  Cardiovascular:     Rate and Rhythm: Regular rhythm.  Pulmonary:     Breath sounds: No wheezing.  Abdominal:     Tenderness: There is no abdominal tenderness.  Musculoskeletal:     Cervical back: Neck supple.  Skin:    Coloration: Skin is not pale.  Neurological:     Mental Status: He is alert.     (all labs ordered are listed, but only abnormal results are displayed) Labs Reviewed  URINALYSIS, ROUTINE W REFLEX MICROSCOPIC - Abnormal; Notable for the following components:      Result Value   Color, Urine BROWN (*)    APPearance CLOUDY (*)    Hgb urine dipstick LARGE (*)    Protein, ur 100 (*)    All other components within normal limits  BASIC METABOLIC PANEL WITH GFR - Abnormal; Notable for the following components:   Glucose, Bld 104 (*)    All other components within normal limits  CBC - Abnormal; Notable for the following components:    WBC 11.3 (*)    Hemoglobin 12.5 (*)    All other components within normal limits    EKG: None  Radiology: CT Angio Chest PE W/Cm &/Or Wo Cm Result Date: 11/28/2023 EXAM: CTA of the Chest with contrast for PE 11/28/2023 03:37:29 PM TECHNIQUE: CTA of the chest was performed after the administration of intravenous contrast. Multiplanar reformatted images are provided for review. MIP images are provided for review. Automated exposure control, iterative reconstruction, and/or weight based adjustment of the mA/kV was utilized to reduce the radiation dose to as low as reasonably achievable. COMPARISON: None available. CLINICAL HISTORY: Pulmonary embolism (PE) suspected, low to intermediate prob, positive D-dimer. FINDINGS: PULMONARY ARTERIES: Bilateral upper lobe pulmonary emboli are present. Pulmonary artery size is within normal limits. MEDIASTINUM: The heart and pericardium demonstrate no acute abnormality. Right ventricle is within normal limits. Coronary artery calcifications are present. There is no acute abnormality of the thoracic aorta. LYMPH NODES: No mediastinal, hilar or axillary lymphadenopathy. LUNGS AND PLEURA: Mild  dependent atelectasis is present bilaterally. No focal consolidation or pulmonary edema. No pleural effusion or pneumothorax. UPPER ABDOMEN: Limited images of the upper abdomen are unremarkable. SOFT TISSUES AND BONES: Slight exaggerated thoracic kyphosis is present. Right shoulder arthroplasty is noted. No acute bone or soft tissue abnormality. IMPRESSION: 1. Bilateral upper lobe pulmonary emboli. 2. No evidence of right heart strain. Critical value was called to Dr. Bari at 3:49 pm Electronically signed by: Lonni Necessary MD 11/28/2023 04:06 PM EDT RP Workstation: [id number]   DG Chest 2 View Result Date: 11/28/2023 CLINICAL DATA:  Back pain and shortness of breath EXAM: CHEST - 2 VIEW COMPARISON:  10/23/2023 FINDINGS: Reverse right shoulder arthroplasty. Old healed fracture  of the left distal clavicle. Heart size within normal limits Minimally blunted left costophrenic angle could reflect a trace pleural effusion. Mild thoracic spondylosis. IMPRESSION: 1. Minimally blunted left costophrenic angle could reflect a trace pleural effusion. 2. Mild thoracic spondylosis. 3. Old healed fracture of the left distal clavicle. Electronically Signed   By: Ryan Salvage M.D.   On: 11/28/2023 13:25     Procedures   Medications Ordered in the ED - No data to display                                  Medical Decision Making Amount and/or Complexity of Data Reviewed Labs: ordered.   Patient with hematuria.  No real pain.  Recently started on Xarelto  for PE.  Hemoglobin reassuring.  Differential line also does include various causes hematuria but has had kidney stones previously.  Has kidney stones on my interpretation of CT scan from yesterday. Also discussed with radiologist.    Urine does show some hematuria.  Good kidney function.  Had discussed with urology.  Can be followed as an outpatient.  Urine has been clearing.  Will continue anticoagulation.  Return for worsening symptoms.  Will discharge home.        Final diagnoses:  Gross hematuria    ED Discharge Orders     None          Patsey Lot, MD 11/29/23 2256

## 2023-11-29 NOTE — ED Triage Notes (Signed)
 Patient to ED by POV with c/o blood in urine. He was seen at [facility] yesterday diagnosed with 2 clots in lungs and was started on Xarelto  since then he has had blood in urine.

## 2023-11-30 ENCOUNTER — Encounter

## 2023-11-30 ENCOUNTER — Ambulatory Visit: Admitting: Pulmonary Disease

## 2023-11-30 ENCOUNTER — Telehealth: Payer: Self-pay | Admitting: Internal Medicine

## 2023-11-30 ENCOUNTER — Ambulatory Visit (HOSPITAL_BASED_OUTPATIENT_CLINIC_OR_DEPARTMENT_OTHER)

## 2023-11-30 ENCOUNTER — Encounter: Payer: Self-pay | Admitting: Pulmonary Disease

## 2023-11-30 ENCOUNTER — Encounter: Payer: Self-pay | Admitting: Internal Medicine

## 2023-11-30 VITALS — BP 117/67 | HR 66 | Temp 98.0°F | Ht 65.0 in | Wt 174.0 lb

## 2023-11-30 DIAGNOSIS — I2699 Other pulmonary embolism without acute cor pulmonale: Secondary | ICD-10-CM | POA: Diagnosis not present

## 2023-11-30 DIAGNOSIS — J45909 Unspecified asthma, uncomplicated: Secondary | ICD-10-CM

## 2023-11-30 NOTE — Progress Notes (Signed)
 Andrew Moran    [id number]    01/27/1946  Primary Care Physician:Morrow, Beverley, MD  Referring Physician: Kip Beverley, MD [address] [place],  [place] [address]  Chief complaint:    Patient with a history of asthma Recently evaluated in the emergency department for pulmonary embolism  HPI:  Had developed some chest pain chest discomfort, worsening shortness of breath, went to the emergency room because of persistent symptoms Diagnosed with a pulmonary embolism Was started on Xarelto , day after went back to the emergency room for hematuria This is slowing down  Was being followed for asthma  Recently switched to Breztri  from Symbicort  Breztri  seems to work better  He still does have daily use of albuterol   Regular cough  He does have a history of a prior PE from about 4 years ago following having knee surgery, was on anticoagulation for about 3 months  Outpatient Encounter Medications as of 11/30/2023  Medication Sig   acetaminophen  (TYLENOL ) 500 MG tablet Take 500-1,000 mg by mouth every 6 (six) hours as needed for moderate pain (pain score 4-6).   albuterol  (VENTOLIN  HFA) 108 (90 Base) MCG/ACT inhaler Inhale 2 puffs into the lungs every 6 (six) hours as needed for wheezing or shortness of breath.   atorvastatin  (LIPITOR) 20 MG tablet Take 20 mg by mouth daily.   budesonide -glycopyrrolate-formoterol  (BREZTRI  AEROSPHERE) 160-9-4.8 MCG/ACT AERO inhaler Inhale 2 puffs into the lungs in the morning and at bedtime.   Multiple Vitamins-Minerals (MULTIVITAMINS THER. W/MINERALS) TABS Take 1 tablet by mouth daily.   OVER THE COUNTER MEDICATION Take 2 tablets by mouth at bedtime. Deglycyrrhizinated-DGL   RIVAROXABAN  (XARELTO ) VTE STARTER PACK (15 & 20 MG) Take 15 mg by mouth 2 (two) times daily. Follow package directions: Take one 15mg  tablet by mouth twice a day. On day 22, switch to one 20mg  tablet once a day. Take with food.   [DISCONTINUED]  budesonide -glycopyrrolate-formoterol  (BREZTRI  AEROSPHERE) 160-9-4.8 MCG/ACT AERO inhaler Inhale 2 puffs into the lungs in the morning and at bedtime.   [DISCONTINUED] budesonide -glycopyrrolate-formoterol  (BREZTRI  AEROSPHERE) 160-9-4.8 MCG/ACT AERO inhaler Inhale 2 puffs into the lungs in the morning and at bedtime.   No facility-administered encounter medications on file as of 11/30/2023.    Allergies as of 11/30/2023 - Review Complete 11/30/2023  Allergen Reaction Noted   Zithromax [azithromycin] Shortness Of Breath 02/23/2013   Betadine  [povidone iodine ] Itching 04/08/2019   Povidone-iodine  Itching 04/08/2019   Wound dressing adhesive Other (See Comments) 08/18/2023    Past Medical History:  Diagnosis Date   Arthritis    Asthma    pulmology-- dr wert--- cough varient versus uacs   GERD (gastroesophageal reflux disease)    per pt takes DGL supplement   History of diverticulitis of colon 2002   managed medically , no surgical intervention   History of DVT of lower extremity 06/27/2019   followed by dr g. loni--  completed 3 months xarelto  07/ 2021   History of kidney stones    Renal calculi    left side    Past Surgical History:  Procedure Laterality Date   ANKLE HARDWARE REMOVAL Right 2008   CARPAL TUNNEL RELEASE Bilateral left 1983;  right 1984   CYSTOSCOPY WITH RETROGRADE PYELOGRAM, URETEROSCOPY AND STENT PLACEMENT Left 02/23/2020   Procedure: CYSTOSCOPY WITH RETROGRADE PYELOGRAM, URETEROSCOPY, LITHOTRIPSY, STONE EXTRACTION,  AND STENT PLACEMENT;  Surgeon: Matilda Senior, MD;  Location: [facility];  Service: Urology;  Laterality: Left;  CYSTOSCOPY/RETROGRADE/URETEROSCOPY/STONE EXTRACTION WITH BASKET Bilateral 10/10/2013   Procedure: CYSTOSCOPY/URETEROSCOPY/STONE EXTRACTION WITH HOLMIUM LASER, bilateral retrograde, bilateral ureteral stents;  Surgeon: Garnette CHRISTELLA Shack, MD;  Location: [facility];  Service: Urology;  Laterality: Bilateral;   EXPLORATORY  LAPAROTOMY  1970   EXPLORATORY STOMACH SURGERY FOR TEAR IN STOMACH WALL   EXTRACORPOREAL SHOCK WAVE LITHOTRIPSY Left 10/06/2019   Procedure: EXTRACORPOREAL SHOCK WAVE LITHOTRIPSY (ESWL);  Surgeon: Elisabeth Valli BIRCH, MD;  Location: [facility];  Service: Urology;  Laterality: Left;   EXTRACORPOREAL SHOCK WAVE LITHOTRIPSY  multiple since 2002,  last one 02-28-2013   HOLMIUM LASER APPLICATION Left 10/10/2013   Procedure: HOLMIUM LASER APPLICATION;  Surgeon: Garnette CHRISTELLA Shack, MD;  Location: [facility];  Service: Urology;  Laterality: Left;   KNEE ARTHROSCOPY  02/05/2011   Procedure: ARTHROSCOPY KNEE;  Surgeon: Lynwood SQUIBB Aplington;  Location: [facility];  Service: Orthopedics;  Laterality: Right;  Right knee arthroscopy partial lateral and medial meniscectomy with condyle debridement   LUMBAR SPINE SURGERY  x2  1995   ORIF ANKLE FRACTURE Right 1988   ROTATOR CUFF REPAIR Bilateral right 02/16/2018;  left 1997   TONSILLECTOMY AND ADENOIDECTOMY  1951   TOTAL KNEE ARTHROPLASTY Left 04/08/2019   Procedure: TOTAL KNEE ARTHROPLASTY;  Surgeon: Gerome Charleston, MD;  Location: [facility];  Service: Orthopedics;  Laterality: Left;  with adductor canal    Family History  Problem Relation Age of Onset   Heart attack Father     Social History   Socioeconomic History   Marital status: Married    Spouse name: Not on file   Number of children: Not on file   Years of education: Not on file   Highest education level: Not on file  Occupational History   Not on file  Tobacco Use   Smoking status: Never    Passive exposure: Never   Smokeless tobacco: Never  Vaping Use   Vaping status: Never Used  Substance and Sexual Activity   Alcohol use: Yes    Comment: OCCASIONAL   Drug use: No   Sexual activity: Yes  Other Topics Concern   Not on file  Social History Narrative   Not on file   Social Drivers of Health   Financial Resource Strain: Not on file  Food Insecurity: No Food Insecurity  (08/18/2023)   Hunger Vital Sign    Worried About Running Out of Food in the Last Year: Never true    Ran Out of Food in the Last Year: Never true  Transportation Needs: No Transportation Needs (08/18/2023)   PRAPARE - Administrator, Civil Service (Medical): No    Lack of Transportation (Non-Medical): No  Physical Activity: Not on file  Stress: Not on file  Social Connections: Socially Integrated (08/18/2023)   Social Connection and Isolation Panel    Frequency of Communication with Friends and Family: Twice a week    Frequency of Social Gatherings with Friends and Family: Once a week    Attends Religious Services: 1 to 4 times per year    Active Member of [employer] or Organizations: Yes    Attends Banker Meetings: More than 4 times per year    Marital Status: Married  Catering manager Violence: Not At Risk (08/18/2023)   Humiliation, Afraid, Rape, and Kick questionnaire    Fear of Current or Ex-Partner: No    Emotionally Abused: No    Physically Abused: No    Sexually Abused: No    Review of Systems  Respiratory:  Positive for cough and shortness of breath.     Vitals:   11/30/23 1450  BP: 117/67  Pulse: 66  Temp: 98 F (36.7 C)  SpO2: 97%     Physical Exam Constitutional:      Appearance: He is obese.  HENT:     Mouth/Throat:     Mouth: Mucous membranes are moist.  Eyes:     General: No scleral icterus.    Pupils: Pupils are equal, round, and reactive to light.  Cardiovascular:     Rate and Rhythm: Normal rate and regular rhythm.     Heart sounds: No murmur heard.    No friction rub.  Pulmonary:     Effort: No respiratory distress.     Breath sounds: No stridor. No wheezing, rhonchi or rales.  Musculoskeletal:     Cervical back: No rigidity or tenderness.  Lymphadenopathy:     Cervical: No cervical adenopathy.  Neurological:     Mental Status: He is alert.  Psychiatric:        Mood and Affect: Mood normal.    Data Reviewed: Recent CT  scan reviewed showing bilateral upper lobe PE  Assessment/Plan: Order for venous ultrasound of the lower extremity  Continue Breztri  for asthma  Albuterol  use as needed  Continue anticoagulation  Follow-up in about 6 weeks  Encouraged to call with significant concerns  Gentle activity as tolerated  Jennet Epley MD [facility] 11/30/2023, 3:26 PM  CC: Kip Righter, MD

## 2023-11-30 NOTE — Telephone Encounter (Signed)
 I spoke with patient. He was in ED this past Saturday and diagnosed with PE.  Seen in ED on Sunday with blood in his urine.  He is asking if Dr Loni could review the notes.  I scheduled him for one year follow up with Dr Loni.  Appointment is October 29.  Patient would like to know if he needs sooner appointment. Patient saw pulmonary today, is seeing urology on Wednesday and PCP on Thursday. I told patient we would let him know if Dr Loni wanted to make any changes or felt sooner appointment was needed.

## 2023-11-30 NOTE — Patient Instructions (Signed)
 Follow-up in about 6 weeks  We will get ultrasound of your lower extremity to see whether there are clots in the lower extremities  Continue using Breztri   Gentle exercises to start off with -No exercises to stress the heart or the lungs in the short-term  Call us  with significant concerns

## 2023-11-30 NOTE — Telephone Encounter (Signed)
 Pt called in asking if Dr. Loni can review ED notes and let him know if he needs to do anything medication or f/u wise. He states he was seen for a blood clot and not sure if this was urgent or not. Please advise.   Did inform he is overdue for f/u and we could schedule, but he would like to hear Doctor recommendations first.

## 2023-12-01 ENCOUNTER — Ambulatory Visit: Payer: Self-pay

## 2023-12-01 ENCOUNTER — Encounter: Payer: Self-pay | Admitting: Pulmonary Disease

## 2023-12-01 NOTE — Telephone Encounter (Signed)
**Note De-identified  [place] Obfuscation** Please advise 

## 2023-12-01 NOTE — Telephone Encounter (Signed)
 Seen by dr.olalere yesterday was advised on

## 2023-12-01 NOTE — Telephone Encounter (Signed)
 Spoke with patient and shared reply from Dr. Loni:  He has the appropriate follow ups lined up with urology and pulmonary and I look forward to seeing him in October unless he needs us  sooner.   Patient states he also sent a MyChart message yesterday evening and would like to know if Dr. Loni has reviewed this as well and if this information changes F/U plan.  From MyChart message patient writes: Andrew Moran - Thank you for your return call today. After we spoke, my wife gently squeezed my left calf and I almost jumped out of my chair - very painful. Didn't know if this additional information would be of help.

## 2023-12-02 ENCOUNTER — Ambulatory Visit (HOSPITAL_COMMUNITY)
Admission: RE | Admit: 2023-12-02 | Discharge: 2023-12-02 | Disposition: A | Discharge status: Home / Self Care | Source: Ambulatory Visit | Attending: Pulmonary Disease | Admitting: Pulmonary Disease

## 2023-12-02 DIAGNOSIS — I2699 Other pulmonary embolism without acute cor pulmonale: Secondary | ICD-10-CM | POA: Diagnosis not present

## 2023-12-02 DIAGNOSIS — R310 Gross hematuria: Secondary | ICD-10-CM | POA: Diagnosis not present

## 2023-12-02 NOTE — Telephone Encounter (Signed)
 Shared provider's response with patient via MyChart message (see patient message from 11/30/23).

## 2023-12-03 DIAGNOSIS — I2699 Other pulmonary embolism without acute cor pulmonale: Secondary | ICD-10-CM | POA: Diagnosis not present

## 2023-12-03 DIAGNOSIS — R319 Hematuria, unspecified: Secondary | ICD-10-CM | POA: Diagnosis not present

## 2023-12-03 DIAGNOSIS — Z87442 Personal history of urinary calculi: Secondary | ICD-10-CM | POA: Diagnosis not present

## 2023-12-04 ENCOUNTER — Encounter: Admitting: Physical Therapy

## 2023-12-04 ENCOUNTER — Other Ambulatory Visit: Payer: Self-pay | Admitting: Urology

## 2023-12-04 DIAGNOSIS — R310 Gross hematuria: Secondary | ICD-10-CM

## 2023-12-07 ENCOUNTER — Telehealth: Payer: Self-pay | Admitting: Family Medicine

## 2023-12-07 NOTE — Telephone Encounter (Signed)
 Andrew Moran called in attempting to schedule Andrew Moran for a hematology appointment. I informed her that we do not have his referral just yet and I provided our fax number.

## 2023-12-07 NOTE — Telephone Encounter (Signed)
 Tammy, Please see patient message regarding Breztri , approved uses and potential cause of blood clot.  Thank you.

## 2023-12-07 NOTE — Telephone Encounter (Signed)
 Looks like Andrew Moran just refilled the Breztri .  Dr. Neysa, Please address the questions that the patient has regarding the use of Breztri  with asthma and any connection to Breztri  and blood clots.  Thank you.

## 2023-12-08 ENCOUNTER — Ambulatory Visit
Admission: RE | Admit: 2023-12-08 | Discharge: 2023-12-08 | Disposition: A | Discharge status: Home / Self Care | Source: Ambulatory Visit | Attending: Urology | Admitting: Urology

## 2023-12-08 ENCOUNTER — Ambulatory Visit

## 2023-12-08 DIAGNOSIS — K5730 Diverticulosis of large intestine without perforation or abscess without bleeding: Secondary | ICD-10-CM | POA: Diagnosis not present

## 2023-12-08 DIAGNOSIS — R310 Gross hematuria: Secondary | ICD-10-CM

## 2023-12-08 DIAGNOSIS — N200 Calculus of kidney: Secondary | ICD-10-CM | POA: Diagnosis not present

## 2023-12-08 MED ORDER — IOPAMIDOL (ISOVUE-300) INJECTION 61%
125.0000 mL | Freq: Once | INTRAVENOUS | Status: AC | PRN
  Administered 2023-12-08: 125 mL via INTRAVENOUS

## 2023-12-09 NOTE — Telephone Encounter (Signed)
 Mr. Pinzon. Abnormal blood coagulation and blood clots are not a listed side effect of Breztri .  There is considerable overlap between the broad disorders of COPD and Asthma. The medications in Breztri  are effective against the airway inflammation and spasm of asthma, and you were give samples of Breztri  in an effort to get you past an exacerbation.

## 2023-12-10 ENCOUNTER — Encounter

## 2023-12-11 NOTE — Telephone Encounter (Signed)
 Please advise, I see Dr Neysa has responded but pt awaiting to hear from you

## 2023-12-14 ENCOUNTER — Encounter

## 2023-12-14 NOTE — Telephone Encounter (Signed)
 Spoke with patient, please cancel his appointment. He is going to see hematology .

## 2023-12-17 ENCOUNTER — Encounter

## 2023-12-21 ENCOUNTER — Ambulatory Visit: Admitting: Adult Health

## 2023-12-21 ENCOUNTER — Encounter

## 2023-12-22 ENCOUNTER — Encounter

## 2023-12-22 DIAGNOSIS — R310 Gross hematuria: Secondary | ICD-10-CM | POA: Diagnosis not present

## 2023-12-22 DIAGNOSIS — N201 Calculus of ureter: Secondary | ICD-10-CM | POA: Diagnosis not present

## 2023-12-23 NOTE — Telephone Encounter (Signed)
 Appointment cancelled as requested.  Nothing further needed.

## 2023-12-24 ENCOUNTER — Encounter

## 2023-12-24 DIAGNOSIS — Z23 Encounter for immunization: Secondary | ICD-10-CM | POA: Diagnosis not present

## 2023-12-28 ENCOUNTER — Encounter

## 2023-12-29 ENCOUNTER — Telehealth: Payer: Self-pay | Admitting: Internal Medicine

## 2023-12-29 ENCOUNTER — Other Ambulatory Visit: Payer: Self-pay | Admitting: Urology

## 2023-12-29 DIAGNOSIS — I2699 Other pulmonary embolism without acute cor pulmonale: Secondary | ICD-10-CM

## 2023-12-29 NOTE — Telephone Encounter (Signed)
 Pt c/o medication issue:  1. Name of Medication:  RIVAROXABAN  (XARELTO ) VTE STARTER PACK (15 & 20 MG)   2. How are you currently taking this medication (dosage and times per day)?  As prescribed--was taking twice daily, now taking once daily  3. Are you having a reaction (difficulty breathing--STAT)?   4. What is your medication issue?   Patient had blood clots in the hospital and was prescribed Xarelto . He would like to know if Dr. Loni agrees with this + will to take on prescription? Please advise.

## 2023-12-29 NOTE — Telephone Encounter (Signed)
   Pre-operative Risk Assessment    Patient Name: Andrew Moran  DOB: 13-Nov-1945 MRN: [id number]   Date of last office visit: 08/29/22 Date of next office visit: 01/13/24   Request for Surgical Clearance    Procedure:   Second Stage Cystoscopy, Left Ureteroscopy Laser Lipotripsy and Stent   Date of Surgery:  Clearance 01/25/24                                Surgeon:  Dr. Selma Socks Group or Practice Name:  [facility] Phone number:  [phone] Fax number:   [phone]    Type of Clearance Requested:   - Pharmacy:  Hold Rivaroxaban  (Xarelto ) Hold 3 days Prior   Type of Anesthesia:  General    Additional requests/questions:  Please advise surgeon/provider what medications should be held.  Signed, Belisicia T Woods   12/29/2023, 1:16 PM

## 2023-12-29 NOTE — Telephone Encounter (Signed)
 Spoke to patient's wife she stated husband went to [facility] ED recently, diagnosed with blood clots in right and left lungs.He was prescribed Xarelto .He wanted Dr.Acharya to review his chart to make sure he is on correct medication. He also will be having kidney stone surgery on 10/27 and 11/10.Advised to have surgeon fax clearance. I will send message to Dr.Acharya.

## 2023-12-29 NOTE — Telephone Encounter (Signed)
   Pre-operative Risk Assessment    Patient Name: Andrew Moran  DOB: [birth year] MRN: [id number]   Date of last office visit: 08/29/22 Date of next office visit: 01/13/24   Request for Surgical Clearance    Procedure:  First Stage Cystoscopy, Left Ureteroscopy Laser Lipotripsy and Stent  Date of Surgery:  Clearance 01/11/24                                Surgeon:  Dr. Selma Socks Group or Practice Name:  [facility] Phone number:  [phone] Fax number:  [phone]   Type of Clearance Requested:   Xarelto  Hold 3 days Prior   Type of Anesthesia:  General    Additional requests/questions:  Please advise surgeon/provider what medications should be held.  Signed, Belisicia T Woods   12/29/2023, 1:09 PM

## 2023-12-30 ENCOUNTER — Encounter: Admitting: Physical Therapy

## 2024-01-03 NOTE — Telephone Encounter (Signed)
 Patient with diagnosis of pulmonary embolism on Xarelto  for anticoagulation.     Procedure:  First Stage Cystoscopy, Left Ureteroscopy Laser Lipotripsy and Stent   Date of Surgery:  Clearance 01/11/24       CrCl 75 Platelet count 284   Patient had provoked DVT after TKA 06/2019 Patient hd PE 11/28/23 (after R reverse shoulder arthroplasty 08/2023)   Has only been 1 month since PE.  Previous events appear provoked after orthopedic procedures.  Would recommend bridging with enoxaparin, but will need MD clearance as to when safe to hold.   **This guidance is not considered finalized until pre-operative APP has relayed final recommendations.**

## 2024-01-04 ENCOUNTER — Encounter: Admitting: Physical Therapy

## 2024-01-04 NOTE — Progress Notes (Signed)
 Request sent to Dr. EMERSON Siad to send pre op orders for PST appointment 01/05/24.

## 2024-01-04 NOTE — Patient Instructions (Signed)
 SURGICAL WAITING ROOM VISITATION  Patients having surgery or a procedure may have no more than 2 support people in the waiting area - these visitors may rotate.    Children under the age of 55 must have an adult with them who is not the patient.  Visitors with respiratory illnesses are discouraged from visiting and should remain at home.  If the patient needs to stay at the hospital during part of their recovery, the visitor guidelines for inpatient rooms apply. Pre-op nurse will coordinate an appropriate time for 1 support person to accompany patient in pre-op.  This support person may not rotate.    Please refer to the [facility] website for the visitor guidelines for Inpatients (after your surgery is over and you are in a regular room).       Your procedure is scheduled on: 01/11/24   Report to [facility] Main Entrance    Report to admitting at 1005 AM   Call this number if you have problems the morning of surgery [phone]   Do not eat food or drink liquids :After Midnight.but may have sips of water  to take meds.    FOLLOW BOWEL PREP AND ANY ADDITIONAL PRE OP INSTRUCTIONS YOU RECEIVED FROM YOUR SURGEON'S OFFICE!!!     Oral Hygiene is also important to reduce your risk of infection.                                    Remember - BRUSH YOUR TEETH THE MORNING OF SURGERY WITH YOUR REGULAR TOOTHPASTE  DENTURES WILL BE REMOVED PRIOR TO SURGERY PLEASE DO NOT APPLY Poly grip OR ADHESIVES!!!     Stop all vitamins and herbal supplements 7 days before surgery.   Take these medicines the morning of surgery with A SIP OF WATER : tylenol , inhaler, atorvastatin .             You may not have any metal on your body including hair pins, jewelry, and body piercing             Do not wear make-up, lotions, powders, perfumes/cologne, or deodorant              Men may shave face and neck.   Do not bring valuables to the hospital. [facility] IS NOT             RESPONSIBLE    FOR VALUABLES.   Contacts, glasses, dentures or bridgework may not be worn into surgery.   DO NOT BRING YOUR HOME MEDICATIONS TO THE HOSPITAL. PHARMACY WILL DISPENSE MEDICATIONS LISTED ON YOUR MEDICATION LIST TO YOU DURING YOUR ADMISSION IN THE HOSPITAL!    Patients discharged on the day of surgery will not be allowed to drive home.  Someone NEEDS to stay with you for the first 24 hours after anesthesia.   Special Instructions: Bring a copy of your healthcare power of attorney and living will documents the day of surgery if you haven't scanned them before.              Please read over the following fact sheets you were given: IF YOU HAVE QUESTIONS ABOUT YOUR PRE-OP INSTRUCTIONS PLEASE CALL [phone] [place]   If you received a COVID test during your pre-op visit  it is requested that you wear a mask when out in public, stay away from anyone that may not be feeling well and notify your surgeon if you develop  symptoms. If you test positive for Covid or have been in contact with anyone that has tested positive in the last 10 days please notify you surgeon.    [place] - Preparing for Surgery Before surgery, you can play an important role.  Because skin is not sterile, your skin needs to be as free of germs as possible.  You can reduce the number of germs on your skin by washing with CHG (chlorahexidine gluconate) soap before surgery.  CHG is an antiseptic cleaner which kills germs and bonds with the skin to continue killing germs even after washing. Please DO NOT use if you have an allergy  to CHG or antibacterial soaps.  If your skin becomes reddened/irritated stop using the CHG and inform your nurse when you arrive at Short Stay. Do not shave (including legs and underarms) for at least 48 hours prior to the first CHG shower.  You may shave your face/neck.  Please follow these instructions carefully:  1.  Shower with CHG Soap the night before surgery and the morning of surgery.  2.  If you  choose to wash your hair, wash your hair first as usual with your normal  shampoo.  3.  After you shampoo, rinse your hair and body thoroughly to remove the shampoo.                             4.  Use CHG as you would any other liquid soap.  You can apply chg directly to the skin and wash.  Gently with a scrungie or clean washcloth.  5.  Apply the CHG Soap to your body ONLY FROM THE NECK DOWN.   Do   not use on face/ open                           Wound or open sores. Avoid contact with eyes, ears mouth and   genitals (private parts).                       Wash face,  Genitals (private parts) with your normal soap.             6.  Wash thoroughly, paying special attention to the area where your    surgery  will be performed.  7.  Thoroughly rinse your body with warm water  from the neck down.  8.  DO NOT shower/wash with your normal soap after using and rinsing off the CHG Soap.                9.  Pat yourself dry with a clean towel.            10.  Wear clean pajamas.            11.  Place clean sheets on your bed the night of your first shower and do not  sleep with pets. Day of Surgery : Do not apply any CHG, lotions/deodorants the morning of surgery.  Please wear clean clothes to the hospital/surgery center.  FAILURE TO FOLLOW THESE INSTRUCTIONS MAY RESULT IN THE CANCELLATION OF YOUR SURGERY  PATIENT SIGNATURE_________________________________  NURSE SIGNATURE__________________________________  ________________________________________________________________________

## 2024-01-04 NOTE — Progress Notes (Signed)
 COVID Vaccine received:  []  No [x]  Yes Date of any COVID positive Test in last 90 days: no PCP - Dr. [employer] Cardiologist - Dr. Soyla Merck  Chest x-ray - 11/28/23 Epic EKG -  11/28/23 Epic Stress Test - 05/29/20 Epic ECHO - 09/22/22 Epic Cardiac Cath -   Bowel Prep - [x]  No  []   Yes ______  Pacemaker / ICD device [x]  No []  Yes   Spinal Cord Stimulator:[x]  No []  Yes       History of Sleep Apnea? [x]  No []  Yes   CPAP used?- [x]  No []  Yes    Does the patient monitor blood sugar?          [x]  No []  Yes  []  N/A  Patient has: [x]  NO Hx DM   []  Pre-DM                 []  DM1  []   DM2 Does patient have a [employer] or Dexacom? [x]  No []  Yes   Fasting Blood Sugar Ranges- n/a Checks Blood Sugar __0___ times a day  GLP1 agonist / usual dose - no GLP1 instructions:  SGLT-2 inhibitors / usual dose - no SGLT-2 instructions:   Blood Thinner / Instructions:Xarelto . Hold x 3 days. Last dose to be 01/07/24 Aspirin Instructions:  Comments:   Activity level: Patient is able / to climb a flight of stairs without difficulty; [x]  No CP  [x]  No SOB,    Patient can  perform ADLs without assistance.   Anesthesia review: Hx. PE , On Xarelto   Patient denies shortness of breath, fever, cough and chest pain at PAT appointment.  Patient verbalized understanding and agreement to the Pre-Surgical Instructions that were given to them at this PAT appointment. Patient was also educated of the need to review these PAT instructions again prior to his/her surgery.I reviewed the appropriate phone numbers to call if they have any and questions or concerns.

## 2024-01-05 ENCOUNTER — Encounter (HOSPITAL_COMMUNITY): Payer: Self-pay

## 2024-01-05 ENCOUNTER — Encounter (HOSPITAL_COMMUNITY)
Admission: RE | Admit: 2024-01-05 | Discharge: 2024-01-05 | Disposition: A | Discharge status: Home / Self Care | Source: Ambulatory Visit | Attending: Urology | Admitting: Urology

## 2024-01-05 ENCOUNTER — Other Ambulatory Visit: Payer: Self-pay

## 2024-01-05 DIAGNOSIS — Z86718 Personal history of other venous thrombosis and embolism: Secondary | ICD-10-CM | POA: Insufficient documentation

## 2024-01-05 DIAGNOSIS — Z01812 Encounter for preprocedural laboratory examination: Secondary | ICD-10-CM | POA: Diagnosis not present

## 2024-01-05 DIAGNOSIS — N132 Hydronephrosis with renal and ureteral calculous obstruction: Secondary | ICD-10-CM | POA: Insufficient documentation

## 2024-01-05 DIAGNOSIS — Z86711 Personal history of pulmonary embolism: Secondary | ICD-10-CM | POA: Insufficient documentation

## 2024-01-05 DIAGNOSIS — J45909 Unspecified asthma, uncomplicated: Secondary | ICD-10-CM | POA: Insufficient documentation

## 2024-01-05 LAB — BASIC METABOLIC PANEL WITH GFR
Anion gap: 10 (ref 5–15)
BUN: 15 mg/dL (ref 8–23)
CO2: 25 mmol/L (ref 22–32)
Calcium: 9.6 mg/dL (ref 8.9–10.3)
Chloride: 107 mmol/L (ref 98–111)
Creatinine, Ser: 0.91 mg/dL (ref 0.61–1.24)
GFR, Estimated: >60 mL/min (ref >60)
Glucose, Bld: 94 mg/dL (ref 70–99)
Potassium: 4.9 mmol/L (ref 3.5–5.1)
Sodium: 141 mmol/L (ref 135–145)

## 2024-01-05 LAB — CBC
HCT: 41.2 % (ref 39.0–52.0)
Hemoglobin: 13 g/dL (ref 13.0–17.0)
MCH: 28.2 pg (ref 26.0–34.0)
MCHC: 31.6 g/dL (ref 30.0–36.0)
MCV: 89.4 fL (ref 80.0–100.0)
Platelets: 287 K/uL (ref 150–400)
RBC: 4.61 MIL/uL (ref 4.22–5.81)
RDW: 13.5 % (ref 11.5–15.5)
WBC: 6.7 K/uL (ref 4.0–10.5)
nRBC: 0 % (ref 0.0–0.2)

## 2024-01-06 ENCOUNTER — Encounter

## 2024-01-06 NOTE — Progress Notes (Incomplete)
 Case: [id number] Date/Time: 01/11/24 1208   Procedure: CYSTOSCOPY/URETEROSCOPY/HOLMIUM LASER/STENT PLACEMENT (Left) - FIRST STAGE   Anesthesia type: General   Diagnosis: Ureteral stone [N20.1]   Pre-op diagnosis: LEFT URETERAL STONE   Location: [facility] ROOM 03 / [facility]   Surgeons: Selma Donnice SAUNDERS, MD       DISCUSSION: Andrew Moran is a 78 yo male with hx of DVT, recent diagnosis of PE (11/2023), asthma, GERD, arthritis.  Pt had a right reverse total shoulder replacement performed 08/11/23. [place] and had resultant torn rotator cuff and this lead to surgery. Subsequently patient presented to the ED on 11/28/23 for acute back pain and SOB. CTA PE study obtained showed bilateral upper lobe PEs without evidence of heart strain. He was discharged with prescription for Xarelto .   He presented to the ED on 9/14 with hematuria. CT Renal showed bilateral nonobstructing kidney stones. Advised to continue Xarelto  and f/u with Urology.   Seen by Pulmonology on 9/15. He is on inhalers for asthma. DVT study was ordered which showed no evidence of DVT. Advised f/u in 6 weeks.  Seen by Hematology on 10/23. Note not yet completed but it appears that he is completing hypercoagulable w/u.  LD Xarelto : 10/23. Patient will be completing Lovenox bridge. Pharmacy to give instructions. INR for DOS  VS: BP 132/77   Pulse 70   Temp 36.7 C (Oral)   Resp 16   Ht 5' 5 (1.651 m)   Wt 77.1 kg   SpO2 97%   BMI 28.29 kg/m   PROVIDERS: Kip Righter, MD   LABS: Labs reviewed: Acceptable for surgery. (all labs ordered are listed, but only abnormal results are displayed)  Labs Reviewed  CBC  BASIC METABOLIC PANEL WITH GFR     IMAGES:  CT Renal 12/08/23:  IMPRESSION: 1. Multiple bilateral nonobstructive renal calculi, larger and more numerous on the left. Notably, there is a mobile calculus in the left renal pelvis measuring 0.9 cm with extensive mucosal thickening of the  renal pelvis. Additional staghorn type calculus in the inferior pole calices of the left kidney measuring 1.1 cm. No hydronephrosis. 2. No mass or suspicious contrast enhancement. No other urinary tract filling defect on delayed phase imaging. 3. Prostatomegaly with multiple prostatic calcifications. 4. Pancolonic diverticulosis without evidence of acute diverticulitis.   Aortic Atherosclerosis (ICD10-I70.0).  CTA Chest 11/28/23:  IMPRESSION: 1. Bilateral upper lobe pulmonary emboli. 2. No evidence of right heart strain. Critical value was called to Dr. Bari at 3:49 pm  EKG 11/28/23:  Sinus rhythm Right axis deviation Low voltage, precordial leads  Echo 09/22/22:  IMPRESSIONS    1. Left ventricular ejection fraction, by estimation, is 60 to 65%. The left ventricle has normal function. The left ventricle has no regional wall motion abnormalities. There is mild concentric left ventricular hypertrophy. Left ventricular diastolic parameters are consistent with Grade I diastolic dysfunction (impaired relaxation).  2. Right ventricular systolic function is normal. The right ventricular size is normal. There is normal pulmonary artery systolic pressure.  3. The mitral valve is normal in structure. Trivial mitral valve regurgitation. No evidence of mitral stenosis.  4. The aortic valve is tricuspid. Aortic valve regurgitation is mild. No aortic stenosis is present.  5. The inferior vena cava is normal in size with greater than 50% respiratory variability, suggesting right atrial pressure of 3 mmHg.  Stress test 05/29/2020:  The left ventricular ejection fraction is normal (55-65%). Nuclear stress EF: 59%. There was  no ST segment deviation noted during stress. The study is normal.   No ischemia or infarction on perfusion images.      Past Medical History:  Diagnosis Date   Arthritis    Asthma    pulmology-- dr wert--- cough varient versus uacs   GERD (gastroesophageal  reflux disease)    per pt takes DGL supplement   History of diverticulitis of colon 2002   managed medically , no surgical intervention   History of DVT of lower extremity 06/27/2019   followed by dr g. loni--  completed 3 months xarelto  07/ 2021   History of kidney stones    Renal calculi    left side    Past Surgical History:  Procedure Laterality Date   ANKLE HARDWARE REMOVAL Right 2008   CARPAL TUNNEL RELEASE Bilateral left 1983;  right 1984   CYSTOSCOPY WITH RETROGRADE PYELOGRAM, URETEROSCOPY AND STENT PLACEMENT Left 02/23/2020   Procedure: CYSTOSCOPY WITH RETROGRADE PYELOGRAM, URETEROSCOPY, LITHOTRIPSY, STONE EXTRACTION,  AND STENT PLACEMENT;  Surgeon: Matilda Senior, MD;  Location: [facility];  Service: Urology;  Laterality: Left;   CYSTOSCOPY/RETROGRADE/URETEROSCOPY/STONE EXTRACTION WITH BASKET Bilateral 10/10/2013   Procedure: CYSTOSCOPY/URETEROSCOPY/STONE EXTRACTION WITH HOLMIUM LASER, bilateral retrograde, bilateral ureteral stents;  Surgeon: Senior CHRISTELLA Matilda, MD;  Location[facility];  Service: Urology;  Laterality: Bilateral;   EXPLORATORY LAPAROTOMY  1970   EXPLORATORY STOMACH SURGERY FOR TEAR IN STOMACH WALL   EXTRACORPOREAL SHOCK WAVE LITHOTRIPSY Left 10/06/2019   Procedure: EXTRACORPOREAL SHOCK WAVE LITHOTRIPSY (ESWL);  Surgeon: Elisabeth Valli BIRCH, MD;  Location[facility];  Service: Urology;  Laterality: Left;   EXTRACORPOREAL SHOCK WAVE LITHOTRIPSY  multiple since 2002,  last one 02-28-2013   HOLMIUM LASER APPLICATION Left 10/10/2013   Procedure: HOLMIUM LASER APPLICATION;  Surgeon: Senior CHRISTELLA Matilda, MD;  Location[facility];  Service: Urology;  Laterality: Left;   KNEE ARTHROSCOPY  02/05/2011   Procedure: ARTHROSCOPY KNEE;  Surgeon: Lynwood SQUIBB [place];  Location: [facility];  Service: Orthopedics;  Laterality: Right;  Right knee arthroscopy partial lateral and medial meniscectomy with condyle debridement   LUMBAR SPINE SURGERY  x2  1995    ORIF ANKLE FRACTURE Right 1988   ROTATOR CUFF REPAIR Bilateral right 02/16/2018;  left 1997   TONSILLECTOMY AND ADENOIDECTOMY  1951   TOTAL KNEE ARTHROPLASTY Left 04/08/2019   Procedure: TOTAL KNEE ARTHROPLASTY;  Surgeon: Gerome Charleston, MD;  Location[facility];  Service: Orthopedics;  Laterality: Left;  with adductor canal    MEDICATIONS:  acetaminophen  (TYLENOL ) 500 MG tablet   albuterol  (VENTOLIN  HFA) 108 (90 Base) MCG/ACT inhaler   atorvastatin  (LIPITOR) 20 MG tablet   budesonide -glycopyrrolate-formoterol  (BREZTRI  AEROSPHERE) 160-9-4.8 MCG/ACT AERO inhaler   Multiple Vitamins-Minerals (MULTIVITAMINS THER. W/MINERALS) TABS   RIVAROXABAN  (XARELTO ) VTE STARTER PACK (15 & 20 MG)   XARELTO  20 MG TABS tablet   No current facility-administered medications for this encounter.   Burnard CHRISTELLA Odis DEVONNA [facility] Short Stay/Anesthesiology [facility] Phone [phone] 01/08/2024 3:06 PM

## 2024-01-06 NOTE — Telephone Encounter (Signed)
 Dr.Acharya, will you please comment on pharmacy recommendations concerning Xarelto  hold for First Stage Cystoscopy, Left Ureteroscopy Laser Lipotripsy and Stent scheduled for 01/11/2024.  Per Pharmacy 01/03/2024 Patient had provoked DVT after TKA 06/2019 Patient hd PE 11/28/23 (after R reverse shoulder arthroplasty 08/2023)   Has only been 1 month since PE.  Previous events appear provoked after orthopedic procedures.  Would recommend bridging with enoxaparin, but will need MD clearance as to when safe to hold.

## 2024-01-07 ENCOUNTER — Inpatient Hospital Stay

## 2024-01-07 ENCOUNTER — Inpatient Hospital Stay: Attending: Hematology and Oncology | Admitting: Hematology and Oncology

## 2024-01-07 VITALS — BP 105/70 | HR 62 | Temp 98.0°F | Resp 13 | Wt 173.6 lb

## 2024-01-07 DIAGNOSIS — K219 Gastro-esophageal reflux disease without esophagitis: Secondary | ICD-10-CM | POA: Diagnosis not present

## 2024-01-07 DIAGNOSIS — Z7901 Long term (current) use of anticoagulants: Secondary | ICD-10-CM | POA: Insufficient documentation

## 2024-01-07 DIAGNOSIS — I82409 Acute embolism and thrombosis of unspecified deep veins of unspecified lower extremity: Secondary | ICD-10-CM

## 2024-01-07 DIAGNOSIS — J45991 Cough variant asthma: Secondary | ICD-10-CM | POA: Insufficient documentation

## 2024-01-07 DIAGNOSIS — J45901 Unspecified asthma with (acute) exacerbation: Secondary | ICD-10-CM | POA: Insufficient documentation

## 2024-01-07 DIAGNOSIS — L0390 Cellulitis, unspecified: Secondary | ICD-10-CM | POA: Insufficient documentation

## 2024-01-07 DIAGNOSIS — R0609 Other forms of dyspnea: Secondary | ICD-10-CM | POA: Diagnosis not present

## 2024-01-07 DIAGNOSIS — Z87442 Personal history of urinary calculi: Secondary | ICD-10-CM | POA: Diagnosis not present

## 2024-01-07 DIAGNOSIS — Z86718 Personal history of other venous thrombosis and embolism: Secondary | ICD-10-CM | POA: Diagnosis not present

## 2024-01-07 DIAGNOSIS — Z809 Family history of malignant neoplasm, unspecified: Secondary | ICD-10-CM | POA: Insufficient documentation

## 2024-01-07 DIAGNOSIS — Z96659 Presence of unspecified artificial knee joint: Secondary | ICD-10-CM | POA: Insufficient documentation

## 2024-01-07 DIAGNOSIS — Z79899 Other long term (current) drug therapy: Secondary | ICD-10-CM | POA: Diagnosis not present

## 2024-01-07 DIAGNOSIS — Z86711 Personal history of pulmonary embolism: Secondary | ICD-10-CM | POA: Diagnosis present

## 2024-01-07 LAB — IRON AND IRON BINDING CAPACITY (CC-WL,HP ONLY)
Iron: 59 ug/dL (ref 45–182)
Saturation Ratios: 18 % (ref 17.9–39.5)
TIBC: 322 ug/dL (ref 250–450)
UIBC: 263 ug/dL (ref 117–376)

## 2024-01-07 LAB — FERRITIN: Ferritin: 100 ng/mL (ref 24–336)

## 2024-01-07 NOTE — Progress Notes (Signed)
 [facility] [phone]   [phone]  INITIAL CONSULT NOTE  Mor[facility] Care Team: Kip Righter, MD as PCP - General (Family Medicine) Loni Soyla LABOR, MD as PCP - Cardiology (Cardiology)  Hematological/Oncological History # Recurrent Provoked VTEs  11/28/2023: CT PE study showed bilateral upper lobe pulmonary emboli with no evidence of heart strain 01/07/2024: establish care with Dr. Federico   CHIEF COMPLAINTS/PURPOSE OF CONSULTATION:  Recurrent Provoked VTEs    HISTORY OF PRESENTING ILLNESS:  Andrew Moran 78 y.o. male with medical history significant for asthma, arthritis, GERD, who presents for evaluation of recurrent provoked VTE's.  On review of Andrew previous records Andrew Moran underwent a CTA on 11/28/2023 which showed bilateral upper lobe pulmonary emboli with no evidence of heart strain.  Due to concern for his history of recurrent VTE's he was for to hematology for further evaluation and management.  On exam today Andrew Moran reports that he originally went to [facility] hospital because he was having pains in his chest.  He reports that he had a blood clot approximately 4 years ago when he underwent a knee surgery.  He was on Xarelto  for 3 months time.  Unfortunately he is currently having some bleeding with his Xarelto  therapy because of kidney stones.  He reports that he has visited with Andrew urologist and Andrew x-rays do show that his stones are too large to pass.  Procedure is planned in order to help break up Andrew stones and have them removed.  He is having blood in his urine but he is not having any bleeding elsewhere such as nosebleeds, gum bleeding, or blood in Andrew stool.  On further discussion he reports that his mother has history glaucoma and his father had a heart attack in his 25s.  His brother died from cancer.  He reports he has 1 healthy son and 2 grandchildren and 2 great-grandchildren.  He reports that he is a never smoker but does  drink alcohol on occasion.  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  He otherwise denies any fevers, chills, sweats, nausea, or diarrhea.  Full 10 point ROS otherwise negative.  MEDICAL HISTORY:  Past Medical History:  Diagnosis Date   Arthritis    Asthma    pulmology-- dr wert--- cough varient versus uacs   GERD (gastroesophageal reflux disease)    per pt takes DGL supplement   History of diverticulitis of colon 2002   managed medically , no surgical intervention   History of DVT of lower extremity 06/27/2019   followed by dr g. loni--  completed 3 months xarelto  07/ 2021   History of kidney stones    Renal calculi    left side    SURGICAL HISTORY: Past Surgical History:  Procedure Laterality Date   ANKLE HARDWARE REMOVAL Right 2008   CARPAL TUNNEL RELEASE Bilateral left 1983;  right 1984   CYSTOSCOPY WITH RETROGRADE PYELOGRAM, URETEROSCOPY AND STENT PLACEMENT Left 02/23/2020   Procedure: CYSTOSCOPY WITH RETROGRADE PYELOGRAM, URETEROSCOPY, LITHOTRIPSY, STONE EXTRACTION,  AND STENT PLACEMENT;  Surgeon: Matilda Senior, MD;  Location: [facility];  Service: Urology;  Laterality: Left;   CYSTOSCOPY/RETROGRADE/URETEROSCOPY/STONE EXTRACTION WITH BASKET Bilateral 10/10/2013   Procedure: CYSTOSCOPY/URETEROSCOPY/STONE EXTRACTION WITH HOLMIUM LASER, bilateral retrograde, bilateral ureteral stents;  Surgeon: Senior CHRISTELLA Matilda, MD;  Location: [facility];  Service: Urology;  Laterality: Bilateral;   EXPLORATORY LAPAROTOMY  1970   EXPLORATORY STOMACH SURGERY FOR TEAR IN STOMACH WALL  EXTRACORPOREAL SHOCK WAVE LITHOTRIPSY Left 10/06/2019   Procedure: EXTRACORPOREAL SHOCK WAVE LITHOTRIPSY (ESWL);  Surgeon: Elisabeth Valli BIRCH, MD;  Location: [facility];  Service: Urology;  Laterality: Left;   EXTRACORPOREAL SHOCK WAVE LITHOTRIPSY  multiple since 2002,  last one 02-28-2013   HOLMIUM LASER APPLICATION Left 10/10/2013   Procedure:  HOLMIUM LASER APPLICATION;  Surgeon: Garnette CHRISTELLA Shack, MD;  Location: [facility];  Service: Urology;  Laterality: Left;   KNEE ARTHROSCOPY  02/05/2011   Procedure: ARTHROSCOPY KNEE;  Surgeon: Lynwood SQUIBB Aplington;  Location: [facility];  Service: Orthopedics;  Laterality: Right;  Right knee arthroscopy partial lateral and medial meniscectomy with condyle debridement   LUMBAR SPINE SURGERY  x2  1995   ORIF ANKLE FRACTURE Right 1988   ROTATOR CUFF REPAIR Bilateral right 02/16/2018;  left 1997   TONSILLECTOMY AND ADENOIDECTOMY  1951   TOTAL KNEE ARTHROPLASTY Left 04/08/2019   Procedure: TOTAL KNEE ARTHROPLASTY;  Surgeon: Gerome Charleston, MD;  Location: [facility];  Service: Orthopedics;  Laterality: Left;  with adductor canal    SOCIAL HISTORY: Social History   Socioeconomic History   Marital status: Married    Spouse name: Not on file   Number of children: Not on file   Years of education: Not on file   Highest education level: Not on file  Occupational History   Not on file  Tobacco Use   Smoking status: Never    Passive exposure: Never   Smokeless tobacco: Never  Vaping Use   Vaping status: Never Used  Substance and Sexual Activity   Alcohol use: Yes    Alcohol/week: 1.0 standard drink of alcohol    Types: 1 Glasses of wine per week    Comment: OCCASIONAL   Drug use: No   Sexual activity: Yes  Other Topics Concern   Not on file  Social History Narrative   Not on file   Social Drivers of Health   Financial Resource Strain: Not on file  Food Insecurity: No Food Insecurity (08/18/2023)   Hunger Vital Sign    Worried About Running Out of Food in Andrew Last Year: Never true    Ran Out of Food in Andrew Last Year: Never true  Transportation Needs: No Transportation Needs (08/18/2023)   PRAPARE - Administrator, Civil Service (Medical): No    Lack of Transportation (Non-Medical): No  Physical Activity: Not on file  Stress: Not on file  Social Connections: Socially Integrated  (08/18/2023)   Social Connection and Isolation Panel    Frequency of Communication with Friends and Family: Twice a week    Frequency of Social Gatherings with Friends and Family: Once a week    Attends Religious Services: 1 to 4 times per year    Active Member of [employer] or Organizations: Yes    Attends Engineer, Structural: More than 4 times per year    Marital Status: Married  Catering Manager Violence: Not At Risk (08/18/2023)   Humiliation, Afraid, Rape, and Kick questionnaire    Fear of Current or Ex-Partner: No    Emotionally Abused: No    Physically Abused: No    Sexually Abused: No    FAMILY HISTORY: Family History  Problem Relation Age of Onset   Heart attack Father     ALLERGIES:  is allergic to zithromax [azithromycin], betadine  [povidone iodine ], povidone-iodine , and wound dressing adhesive.  MEDICATIONS:  Current Outpatient Medications  Medication Sig Dispense Refill   acetaminophen  (TYLENOL ) 500 MG tablet  Take 500-1,000 mg by mouth every 6 (six) hours as needed for moderate pain (pain score 4-6).     albuterol  (VENTOLIN  HFA) 108 (90 Base) MCG/ACT inhaler Inhale 2 puffs into Andrew lungs every 6 (six) hours as needed for wheezing or shortness of breath.     atorvastatin  (LIPITOR) 20 MG tablet Take 20 mg by mouth daily.     budesonide -glycopyrrolate-formoterol  (BREZTRI  AEROSPHERE) 160-9-4.8 MCG/ACT AERO inhaler Inhale 2 puffs into Andrew lungs in Andrew morning and at bedtime. 1 each 5   Multiple Vitamins-Minerals (MULTIVITAMINS THER. W/MINERALS) TABS Take 1 tablet by mouth daily.     XARELTO  20 MG TABS tablet Take 20 mg by mouth daily.     No current facility-administered medications for this visit.    REVIEW OF SYSTEMS:   Constitutional: ( - ) fevers, ( - )  chills , ( - ) night sweats Eyes: ( - ) blurriness of vision, ( - ) double vision, ( - ) watery eyes Ears, nose, mouth, throat, and face: ( - ) mucositis, ( - ) sore throat Respiratory: ( - ) cough, ( - )  dyspnea, ( - ) wheezes Cardiovascular: ( - ) palpitation, ( - ) chest discomfort, ( - ) lower extremity swelling Gastrointestinal:  ( - ) nausea, ( - ) heartburn, ( - ) change in bowel habits Skin: ( - ) abnormal skin rashes Lymphatics: ( - ) new lymphadenopathy, ( - ) easy bruising Neurological: ( - ) numbness, ( - ) tingling, ( - ) new weaknesses Behavioral/Psych: ( - ) mood change, ( - ) new changes  All other systems were reviewed with Andrew Moran and are negative.  PHYSICAL EXAMINATION:  Vitals:   01/07/24 1334  BP: 105/70  Pulse: 62  Resp: 13  Temp: 98 F (36.7 C)  SpO2: 100%   Filed Weights   01/07/24 1334  Weight: 173 lb 9.6 oz (78.7 kg)    GENERAL: well appearing elderly Caucasian male in NAD  SKIN: skin color, texture, turgor are normal, no rashes or significant lesions EYES: conjunctiva are pink and non-injected, sclera clear LUNGS: clear to auscultation and percussion with normal breathing effort HEART: regular rate & rhythm and no murmurs and no lower extremity edema Musculoskeletal: no cyanosis of digits and no clubbing  PSYCH: alert & oriented x 3, fluent speech NEURO: no focal motor/sensory deficits  LABORATORY DATA:  I have reviewed Andrew data as listed    Latest Ref Rng & Units 01/05/2024   11:34 AM 11/29/2023    7:31 PM 11/28/2023   12:42 PM  CBC  WBC 4.0 - 10.5 K/uL 6.7  11.3  10.1   Hemoglobin 13.0 - 17.0 g/dL 86.9  87.4  87.3   Hematocrit 39.0 - 52.0 % 41.2  39.9  37.6   Platelets 150 - 400 K/uL 287  284  261        Latest Ref Rng & Units 01/05/2024   11:34 AM 11/29/2023    7:31 PM 11/28/2023   12:42 PM  CMP  Glucose 70 - 99 mg/dL 94  895  892   BUN 8 - 23 mg/dL 15  14  15    Creatinine 0.61 - 1.24 mg/dL 9.08  9.10  9.15   Sodium 135 - 145 mmol/L 141  140  141   Potassium 3.5 - 5.1 mmol/L 4.9  4.3  4.1   Chloride 98 - 111 mmol/L 107  105  107   CO2 22 - 32 mmol/L 25  23  22   Calcium  8.9 - 10.3 mg/dL 9.6  8.9  9.4      ASSESSMENT &  PLAN Andrew Moran 78 y.o. male with medical history significant for asthma, arthritis, GERD, who presents for evaluation of recurrent provoked VTE's.  After review of Andrew labs, review of Andrew records, and discussion with Andrew Moran Andrew patients findings are Moran consistent with recurrent provoked VTE's.  A provoked venous thromboembolism (VTE) is one that has a clear inciting factor or event. Provoking factors include prolonged travel/immobility, surgery (particularly abdominal or orthopedic), trauma,  and pregnancy/ estrogen containing birth control. This Moran was reported to have prior surgeries, which would qualify as a transient provoking factor. As such we would recommend 3-6 months of anticoagulation therapy with consideration of additional therapy if symptoms persist. Andrew anticoagulation therapy of choice in this situation is Xarelto . Andrew Moran has a supply of this medication and can afford it without difficulty. We will plan to see Andrew Moran back in 3 months time to reassess and assure they are doing well on treatment.   # Recurrent Provoked DVT/Pulmonary Embolism  --findings at this time are consistent with a provoked VTE  --will order baseline CMP and CBC to assure labs are adequate for DOAC therapy  --recommend Andrew Moran continue xarelto  20mg  PO daily  --Okay to use Lovenox as a bridge prior to his urology procedure. --Moran denies any bleeding, bruising, or dark stools on this medication. It is well tolerated. No difficulties accessing/affording Andrew medication  --RTC in 6 weeks time to reevaluate and discuss long-term anticoagulation moving forward.  Technically Andrew Moran does not require lifelong anticoagulation as his clots are provoked, but given his proclivity for clotting would discussed risks and benefits of maintenance dose   No orders of Andrew defined types were placed in this encounter.   All questions were answered. Andrew Moran knows to call Andrew clinic with any  problems, questions or concerns.  A total of more than 60 minutes were spent on this encounter with face-to-face time and non-face-to-face time, including preparing to see Andrew Moran, ordering tests and/or medications, counseling Andrew Moran and coordination of care as outlined above.   Norleen IVAR Kidney, MD Department of Hematology/Oncology [facility] Phone: [phone] Pager: [phone] Email: [email or web address]  01/07/2024 2:00 PM

## 2024-01-08 ENCOUNTER — Telehealth: Payer: Self-pay | Admitting: *Deleted

## 2024-01-08 LAB — HOMOCYSTEINE: Homocysteine: 14.1 umol/L (ref 0.0–19.2)

## 2024-01-08 LAB — PROTEIN C ACTIVITY: Protein C Activity: 122 % (ref 73–180)

## 2024-01-08 LAB — PROTEIN S, TOTAL: Protein S Ag, Total: 84 % (ref 60–150)

## 2024-01-08 LAB — PROTEIN S ACTIVITY: Protein S Activity: 124 % (ref 63–140)

## 2024-01-08 LAB — ANTITHROMBIN III: AntiThromb III Func: 114 % (ref 75–120)

## 2024-01-08 MED ORDER — ENOXAPARIN SODIUM 80 MG/0.8ML IJ SOSY: PREFILLED_SYRINGE | INTRAMUSCULAR | 0 refills | Status: DC

## 2024-01-08 NOTE — Telephone Encounter (Signed)
 Received message from Burnard Senna PA-C regarding patients periprocedural anticoagulation. Patient to hold Xarelto  for 3 days with Lovenox bridge due to recent PE.  Called in Lovenox 80mg  x 3 injections for Saturday morning, evening, and Sunday morning.  Patient very nervous regarding injections. Wife believes she can do it. Explained process and also advised she can go to [email or web address] and watch patient instructional videos. Advised if patient's pharmacy does not have in stock she should call clinic back immediately.

## 2024-01-08 NOTE — Telephone Encounter (Signed)
 This is being addressed by pharmacy. Secure chat sent to Chris Pavero and Dr.Acharya for input.

## 2024-01-08 NOTE — Telephone Encounter (Signed)
 Caller (Zona) called to follow-up on the status of patient's clearance.  Caller noted patient is scheduled for surgery on Monday, 10/27.

## 2024-01-08 NOTE — Anesthesia Preprocedure Evaluation (Addendum)
 Anesthesia Evaluation  Patient identified by MRN, date of birth, ID band Patient awake    Reviewed: Allergy  & Precautions, NPO status , Patient's Chart, lab work & pertinent test results  Airway Mallampati: II  TM Distance: >3 FB Neck ROM: Full    Dental no notable dental hx. (+) Dental Advisory Given, Caps, Teeth Intact   Pulmonary asthma    Pulmonary exam normal breath sounds clear to auscultation       Cardiovascular + DOE and + DVT  Normal cardiovascular exam Rhythm:Regular Rate:Normal  Echo 09/22/22 1. Left ventricular ejection fraction, by estimation, is 60 to 65%. The  left ventricle has normal function. The left ventricle has no regional  wall motion abnormalities. There is mild concentric left ventricular  hypertrophy. Left ventricular diastolic  parameters are consistent with Grade I diastolic dysfunction (impaired  relaxation).   2. Right ventricular systolic function is normal. The right ventricular  size is normal. There is normal pulmonary artery systolic pressure.   3. The mitral valve is normal in structure. Trivial mitral valve  regurgitation. No evidence of mitral stenosis.   4. The aortic valve is tricuspid. Aortic valve regurgitation is mild. No  aortic stenosis is present.   5. The inferior vena cava is normal in size with greater than 50%  respiratory variability, suggesting right atrial pressure of 3 mmHg.   EKG 11/28/23 Sinus rhythm Right axis deviation Low voltage, precordial leads   Neuro/Psych negative neurological ROS  negative psych ROS   GI/Hepatic Neg liver ROS,GERD  Medicated,,  Endo/Other  negative endocrine ROS    Renal/GU Renal diseaseLeft ureteral calculus  negative genitourinary   Musculoskeletal  (+) Arthritis , Osteoarthritis,    Abdominal   Peds  Hematology negative hematology ROS (+)   Anesthesia Other Findings   Reproductive/Obstetrics                               Anesthesia Physical Anesthesia Plan  ASA: 2  Anesthesia Plan: General   Post-op Pain Management: Dilaudid  IV, Precedex and Ofirmev  IV (intra-op)*   Induction:   PONV Risk Score and Plan: 4 or greater and Treatment may vary due to age or medical condition, Ondansetron  and Dexamethasone   Airway Management Planned: LMA  Additional Equipment: None  Intra-op Plan:   Post-operative Plan: Extubation in OR  Informed Consent: I have reviewed the patients History and Physical, chart, labs and discussed the procedure including the risks, benefits and alternatives for the proposed anesthesia with the patient or authorized representative who has indicated his/her understanding and acceptance.     Dental advisory given  Plan Discussed with: Anesthesiologist and CRNA  Anesthesia Plan Comments: (See PAT note from 10/24)         Anesthesia Quick Evaluation

## 2024-01-08 NOTE — Telephone Encounter (Signed)
 Zona from [facility] called requesting clarification on clearance please advise

## 2024-01-08 NOTE — Telephone Encounter (Signed)
 I will send to preop again if any update. Surgery scheduler called again. We have sent multiple update to surgery scheduler needing input from pharm-d and MD.   Andrew Moran states procedure is Monday 01/11/24

## 2024-01-08 NOTE — Telephone Encounter (Signed)
 Received call from pt inquiring about a new prescription for Lovenox. Reviewed Lovenox with  Dr. Federico. He is ok with this. Advised pt that this prescription came from his cardiologist and it is a considered a bridge between his Xarelto  and his kidney stone surgery on 01/11/24. Reviewed with pt the process/timing of all this Pt had his last dose of Xarelto  yesterday morning (will resume after his surgery). His lovenox is to start tomorrow morning and then again tomorrow evening. He will take another dose Sunday morning and then no more as his surgery is set for Monday am. Advised that he discuss with the pharmacist how to do these injections when he goes to pick them up tonight. Advised that the Lovenox is a short acting blood thinner and would be out of his system by the time he has surgery on 01/11/24. Also the Xarelto  will have had time to get out of his system as well but this weekend he is covered with the lovenox for his blood thinner. Pt and wife voiced understanding.

## 2024-01-08 NOTE — Telephone Encounter (Signed)
 Per Pharmacy, waiting on MD clearance

## 2024-01-09 LAB — CARDIOLIPIN ANTIBODIES, IGG, IGM, IGA
Anticardiolipin IgA: <9 U/mL (ref 0–11)
Anticardiolipin IgG: <9 GPL U/mL (ref 0–14)
Anticardiolipin IgM: <9 [MPL'U]/mL (ref 0–12)

## 2024-01-09 LAB — PROTEIN C, TOTAL: Protein C, Total: 103 % (ref 60–150)

## 2024-01-10 LAB — BETA-2-GLYCOPROTEIN I ABS, IGG/M/A
Beta-2 Glyco I IgG: <9 GPI IgG units (ref 0–20)
Beta-2-Glycoprotein I IgA: <9 GPI IgA units (ref 0–25)
Beta-2-Glycoprotein I IgM: <9 GPI IgM units (ref 0–32)

## 2024-01-11 ENCOUNTER — Encounter (HOSPITAL_COMMUNITY)
Admission: RE | Disposition: A | Discharge status: Home / Self Care | Payer: Self-pay | Source: Ambulatory Visit | Attending: Urology

## 2024-01-11 ENCOUNTER — Encounter (HOSPITAL_COMMUNITY): Payer: Self-pay | Admitting: Urology

## 2024-01-11 ENCOUNTER — Ambulatory Visit (HOSPITAL_COMMUNITY)
Admission: RE | Admit: 2024-01-11 | Discharge: 2024-01-11 | Disposition: A | Discharge status: Home / Self Care | Source: Ambulatory Visit | Attending: Urology | Admitting: Urology

## 2024-01-11 ENCOUNTER — Ambulatory Visit (HOSPITAL_COMMUNITY)

## 2024-01-11 ENCOUNTER — Ambulatory Visit (HOSPITAL_COMMUNITY): Payer: Self-pay | Admitting: Medical

## 2024-01-11 ENCOUNTER — Ambulatory Visit (HOSPITAL_COMMUNITY): Payer: Self-pay | Admitting: Physician Assistant

## 2024-01-11 ENCOUNTER — Other Ambulatory Visit: Payer: Self-pay

## 2024-01-11 DIAGNOSIS — K219 Gastro-esophageal reflux disease without esophagitis: Secondary | ICD-10-CM | POA: Insufficient documentation

## 2024-01-11 DIAGNOSIS — N201 Calculus of ureter: Secondary | ICD-10-CM | POA: Insufficient documentation

## 2024-01-11 DIAGNOSIS — N200 Calculus of kidney: Secondary | ICD-10-CM | POA: Diagnosis not present

## 2024-01-11 DIAGNOSIS — J45909 Unspecified asthma, uncomplicated: Secondary | ICD-10-CM | POA: Insufficient documentation

## 2024-01-11 DIAGNOSIS — I2699 Other pulmonary embolism without acute cor pulmonale: Secondary | ICD-10-CM

## 2024-01-11 DIAGNOSIS — Z86718 Personal history of other venous thrombosis and embolism: Secondary | ICD-10-CM | POA: Diagnosis not present

## 2024-01-11 DIAGNOSIS — N3289 Other specified disorders of bladder: Secondary | ICD-10-CM

## 2024-01-11 DIAGNOSIS — Z79899 Other long term (current) drug therapy: Secondary | ICD-10-CM | POA: Diagnosis not present

## 2024-01-11 HISTORY — PX: CYSTOSCOPY/URETEROSCOPY/HOLMIUM LASER/STENT PLACEMENT: SHX6546

## 2024-01-11 LAB — PROTIME-INR
INR: 1 (ref 0.8–1.2)
Prothrombin Time: 13.8 s (ref 11.4–15.2)

## 2024-01-11 LAB — CBC
HCT: 38.5 % — ABNORMAL LOW (ref 39.0–52.0)
Hemoglobin: 12.1 g/dL — ABNORMAL LOW (ref 13.0–17.0)
MCH: 28.3 pg (ref 26.0–34.0)
MCHC: 31.4 g/dL (ref 30.0–36.0)
MCV: 90 fL (ref 80.0–100.0)
Platelets: 264 K/uL (ref 150–400)
RBC: 4.28 MIL/uL (ref 4.22–5.81)
RDW: 13.6 % (ref 11.5–15.5)
WBC: 6.1 K/uL (ref 4.0–10.5)
nRBC: 0 % (ref 0.0–0.2)

## 2024-01-11 LAB — BASIC METABOLIC PANEL WITH GFR
Anion gap: 12 (ref 5–15)
BUN: 13 mg/dL (ref 8–23)
CO2: 24 mmol/L (ref 22–32)
Calcium: 9.3 mg/dL (ref 8.9–10.3)
Chloride: 109 mmol/L (ref 98–111)
Creatinine, Ser: 0.84 mg/dL (ref 0.61–1.24)
GFR, Estimated: >60 mL/min (ref >60)
Glucose, Bld: 98 mg/dL (ref 70–99)
Potassium: 4.3 mmol/L (ref 3.5–5.1)
Sodium: 145 mmol/L (ref 135–145)

## 2024-01-11 LAB — PROTHROMBIN GENE MUTATION

## 2024-01-11 SURGERY — CYSTOSCOPY/URETEROSCOPY/HOLMIUM LASER/STENT PLACEMENT
Anesthesia: General | Laterality: Left

## 2024-01-11 MED ORDER — TAMSULOSIN HCL 0.4 MG PO CAPS: 0.4000 mg | ORAL_CAPSULE | Freq: Every day | ORAL | 1 refills | Status: DC

## 2024-01-11 MED ORDER — LIDOCAINE HCL (PF) 2 % IJ SOLN
INTRAMUSCULAR | Status: AC
Start: 2024-01-11 — End: 2024-01-11
  Filled 2024-01-11: qty 5

## 2024-01-11 MED ORDER — DOCUSATE SODIUM 100 MG PO CAPS: 100.0000 mg | ORAL_CAPSULE | Freq: Every day | ORAL | 0 refills | Status: DC | PRN

## 2024-01-11 MED ORDER — FENTANYL CITRATE (PF) 100 MCG/2ML IJ SOLN
INTRAMUSCULAR | Status: AC
  Filled 2024-01-11: qty 2

## 2024-01-11 MED ORDER — IOHEXOL 300 MG/ML  SOLN
INTRAMUSCULAR | Status: DC | PRN
  Administered 2024-01-11: 4 mL

## 2024-01-11 MED ORDER — CEFAZOLIN SODIUM-DEXTROSE 2-4 GM/100ML-% IV SOLN
2.0000 g | INTRAVENOUS | Status: AC
  Administered 2024-01-11: 2 g via INTRAVENOUS
  Filled 2024-01-11: qty 100

## 2024-01-11 MED ORDER — ORAL CARE MOUTH RINSE: 15.0000 mL | Freq: Once | OROMUCOSAL | Status: AC

## 2024-01-11 MED ORDER — LACTATED RINGERS IV SOLN: INTRAVENOUS | Status: DC

## 2024-01-11 MED ORDER — ONDANSETRON HCL 4 MG/2ML IJ SOLN
INTRAMUSCULAR | Status: AC
  Filled 2024-01-11: qty 2

## 2024-01-11 MED ORDER — PHENYLEPHRINE HCL (PRESSORS) 10 MG/ML IV SOLN
INTRAVENOUS | Status: DC | PRN
  Administered 2024-01-11: 160 ug via INTRAVENOUS

## 2024-01-11 MED ORDER — DEXAMETHASONE SOD PHOSPHATE PF 10 MG/ML IJ SOLN
INTRAMUSCULAR | Status: DC | PRN
  Administered 2024-01-11: 10 mg via INTRAVENOUS

## 2024-01-11 MED ORDER — OXYCODONE HCL 5 MG PO TABS: 5.0000 mg | ORAL_TABLET | Freq: Once | ORAL | Status: DC | PRN

## 2024-01-11 MED ORDER — ONDANSETRON HCL 4 MG/2ML IJ SOLN: 4.0000 mg | Freq: Once | INTRAMUSCULAR | Status: DC | PRN

## 2024-01-11 MED ORDER — OXYBUTYNIN CHLORIDE 5 MG PO TABS: 5.0000 mg | ORAL_TABLET | Freq: Three times a day (TID) | ORAL | 1 refills | Status: DC

## 2024-01-11 MED ORDER — EPHEDRINE SULFATE (PRESSORS) 25 MG/5ML IV SOSY
PREFILLED_SYRINGE | INTRAVENOUS | Status: DC | PRN
  Administered 2024-01-11: 10 mg via INTRAVENOUS

## 2024-01-11 MED ORDER — OXYCODONE HCL 5 MG/5ML PO SOLN: 5.0000 mg | Freq: Once | ORAL | Status: DC | PRN

## 2024-01-11 MED ORDER — PROPOFOL 10 MG/ML IV BOLUS
INTRAVENOUS | Status: AC
Start: 2024-01-11 — End: 2024-01-11
  Filled 2024-01-11: qty 20

## 2024-01-11 MED ORDER — CEFAZOLIN SODIUM-DEXTROSE 2-4 GM/100ML-% IV SOLN: 2.0000 g | INTRAVENOUS | Status: DC

## 2024-01-11 MED ORDER — PHENAZOPYRIDINE HCL 100 MG PO TABS: 100.0000 mg | ORAL_TABLET | Freq: Three times a day (TID) | ORAL | 0 refills | Status: AC | PRN

## 2024-01-11 MED ORDER — FENTANYL CITRATE (PF) 250 MCG/5ML IJ SOLN
INTRAMUSCULAR | Status: DC | PRN
  Administered 2024-01-11: 100 ug via INTRAVENOUS
  Administered 2024-01-11: 50 ug via INTRAVENOUS

## 2024-01-11 MED ORDER — HYDROMORPHONE HCL 1 MG/ML IJ SOLN: 0.2500 mg | INTRAMUSCULAR | Status: DC | PRN

## 2024-01-11 MED ORDER — CHLORHEXIDINE GLUCONATE 0.12 % MT SOLN
15.0000 mL | Freq: Once | OROMUCOSAL | Status: AC
  Administered 2024-01-11: 15 mL via OROMUCOSAL

## 2024-01-11 MED ORDER — LIDOCAINE 2% (20 MG/ML) 5 ML SYRINGE
INTRAMUSCULAR | Status: DC | PRN
  Administered 2024-01-11: 100 mg via INTRAVENOUS

## 2024-01-11 MED ORDER — ONDANSETRON HCL 4 MG/2ML IJ SOLN
INTRAMUSCULAR | Status: DC | PRN
  Administered 2024-01-11: 4 mg via INTRAVENOUS

## 2024-01-11 MED ORDER — PROPOFOL 10 MG/ML IV BOLUS
INTRAVENOUS | Status: DC | PRN
  Administered 2024-01-11: 100 mg via INTRAVENOUS

## 2024-01-11 MED ORDER — OXYCODONE-ACETAMINOPHEN 5-325 MG PO TABS: 1.0000 | ORAL_TABLET | ORAL | 0 refills | Status: DC | PRN

## 2024-01-11 MED ORDER — SODIUM CHLORIDE 0.9 % IR SOLN
Status: DC | PRN
  Administered 2024-01-11: 3000 mL

## 2024-01-11 MED ORDER — DROPERIDOL 2.5 MG/ML IJ SOLN: 0.6250 mg | Freq: Once | INTRAMUSCULAR | Status: DC | PRN

## 2024-01-11 SURGICAL SUPPLY — 24 items
BAG URO CATCHER STRL LF (MISCELLANEOUS) ×1 IMPLANT
BASKET ZERO TIP NITINOL 2.4FR (BASKET) IMPLANT
BENZOIN TINCTURE PRP APPL 2/3 (GAUZE/BANDAGES/DRESSINGS) IMPLANT
CATH URETERAL DUAL LUMEN 10F (MISCELLANEOUS) IMPLANT
CATH URETL OPEN 5X70 (CATHETERS) IMPLANT
CATH URETL OPEN END 6FR 70 (CATHETERS) IMPLANT
CLOTH BEACON ORANGE TIMEOUT ST (SAFETY) ×1 IMPLANT
DRSG TEGADERM 2-3/8X2-3/4 SM (GAUZE/BANDAGES/DRESSINGS) IMPLANT
FIBER LASER MOSES 200 DFL (Laser) IMPLANT
GLOVE BIOGEL M 7.0 STRL (GLOVE) ×1 IMPLANT
GOWN STRL REUS W/ TWL XL LVL3 (GOWN DISPOSABLE) ×1 IMPLANT
GUIDEWIRE STR DUAL SENSOR (WIRE) ×2 IMPLANT
GUIDEWIRE ZIPWRE .038 STRAIGHT (WIRE) IMPLANT
KIT TURNOVER KIT A (KITS) ×1 IMPLANT
MANIFOLD NEPTUNE II (INSTRUMENTS) ×1 IMPLANT
PACK CYSTO (CUSTOM PROCEDURE TRAY) ×1 IMPLANT
PAD PREP 24X48 CUFFED NSTRL (MISCELLANEOUS) ×1 IMPLANT
SHEATH DILATOR SET 8/10 (MISCELLANEOUS) IMPLANT
SHEATH NAVIGATOR HD 11/13X28 (SHEATH) IMPLANT
SHEATH NAVIGATOR HD 11/13X36 (SHEATH) IMPLANT
STENT URET 6FRX26 CONTOUR (STENTS) IMPLANT
TRACTIP FLEXIVA PULS ID 200XHI (Laser) IMPLANT
TUBING CONNECTING 10 (TUBING) ×1 IMPLANT
TUBING UROLOGY SET (TUBING) ×1 IMPLANT

## 2024-01-11 NOTE — H&P (Signed)
 Urology Preoperative H&P   Chief Complaint: Left renal stone  History of Present Illness: Andrew Moran is a 78 y.o. male with large left renal stones here for staged ureteroscopy with laser lithotripsy. UA unremarkable. Denies fevers, chills, dysuria.  1. Gross hematuria: Developed bilateral pulmonary embolism in 9/25 and initiated Xarelto . Subsequently, he had gross hematuria that has been intermittent dark to red. He denies clots. He denies sensation liquid bladder emptying. He is voiding without difficulty. Denies smoking history, family history of urologic malignancy, prostatitis or urine tract infections. Recent creatinine 0.8 in 9/25. Hemoglobin 12.5 in 9/25. - CTU 12/14/2023 with bilateral renal stones, largest on the left. No suspicious kidney or bladder lesion - Cystoscopy 12/22/2023 with bilateral trigger prostate, no suspicious bladder lesion  #2. Prostate cancer screening: Denies family history of prostate cancer. DRE 11/2023 is 50 g, no nodules. No recent PSA.  3. Urolithiasis: -Underwent ESWL and ureteroscopy in 2021 by Dr. Matilda. - CTU 12/14/2023 with punctate right renal stones and large left renal stones including 9 mm mobile renal pelvis stone, left lower pole stone 1.1 cm and left upper pole stone about 7 mm. - He does have intermittent flank pain.  He is going on a cruise in November 2025   Past Medical History:  Diagnosis Date   Arthritis    Asthma    pulmology-- dr darlean--- cough varient versus uacs   GERD (gastroesophageal reflux disease)    per pt takes DGL supplement   History of diverticulitis of colon 2002   managed medically , no surgical intervention   History of DVT of lower extremity 06/27/2019   followed by dr g. loni--  completed 3 months xarelto  07/ 2021   History of kidney stones    Renal calculi    left side    Past Surgical History:  Procedure Laterality Date   ANKLE HARDWARE REMOVAL Right 2008   CARPAL TUNNEL RELEASE Bilateral  left 1983;  right 1984   CYSTOSCOPY WITH RETROGRADE PYELOGRAM, URETEROSCOPY AND STENT PLACEMENT Left 02/23/2020   Procedure: CYSTOSCOPY WITH RETROGRADE PYELOGRAM, URETEROSCOPY, LITHOTRIPSY, STONE EXTRACTION,  AND STENT PLACEMENT;  Surgeon: Matilda Senior, MD;  Location: [facility];  Service: Urology;  Laterality: Left;   CYSTOSCOPY/RETROGRADE/URETEROSCOPY/STONE EXTRACTION WITH BASKET Bilateral 10/10/2013   Procedure: CYSTOSCOPY/URETEROSCOPY/STONE EXTRACTION WITH HOLMIUM LASER, bilateral retrograde, bilateral ureteral stents;  Surgeon: Senior CHRISTELLA Matilda, MD;  Location: [facility];  Service: Urology;  Laterality: Bilateral;   EXPLORATORY LAPAROTOMY  1970   EXPLORATORY STOMACH SURGERY FOR TEAR IN STOMACH WALL   EXTRACORPOREAL SHOCK WAVE LITHOTRIPSY Left 10/06/2019   Procedure: EXTRACORPOREAL SHOCK WAVE LITHOTRIPSY (ESWL);  Surgeon: Elisabeth Valli BIRCH, MD;  Location: [facility];  Service: Urology;  Laterality: Left;   EXTRACORPOREAL SHOCK WAVE LITHOTRIPSY  multiple since 2002,  last one 02-28-2013   HOLMIUM LASER APPLICATION Left 10/10/2013   Procedure: HOLMIUM LASER APPLICATION;  Surgeon: Senior CHRISTELLA Matilda, MD;  Location: [facility];  Service: Urology;  Laterality: Left;   KNEE ARTHROSCOPY  02/05/2011   Procedure: ARTHROSCOPY KNEE;  Surgeon: Lynwood SQUIBB [place];  Location: [facility];  Service: Orthopedics;  Laterality: Right;  Right knee arthroscopy partial lateral and medial meniscectomy with condyle debridement   LUMBAR SPINE SURGERY  x2  1995   ORIF ANKLE FRACTURE Right 1988   ROTATOR CUFF REPAIR Bilateral right 02/16/2018;  left 1997   TONSILLECTOMY AND ADENOIDECTOMY  1951   TOTAL KNEE ARTHROPLASTY Left 04/08/2019   Procedure: TOTAL KNEE ARTHROPLASTY;  Surgeon: Gerome Charleston,  MD;  Location: [facility];  Service: Orthopedics;  Laterality: Left;  with adductor canal    Allergies:  Allergies  Allergen Reactions   Zithromax [Azithromycin] Shortness Of Breath   Betadine   [Povidone Iodine ] Itching    Iodine  drape, if left on too long   Povidone-Iodine  Itching    povidone-iodine    Wound Dressing Adhesive Other (See Comments)    Irritation     Family History  Problem Relation Age of Onset   Heart attack Father     Social History:  reports that he has never smoked. He has never been exposed to tobacco smoke. He has never used smokeless tobacco. He reports current alcohol use of about 1.0 standard drink of alcohol per week. He reports that he does not use drugs.  ROS: A complete review of systems was performed.  All systems are negative except for pertinent findings as noted.  Physical Exam:  Vital signs in last 24 hours: Temp:  [98.3 F (36.8 C)] 98.3 F (36.8 C) (10/27 1025) Pulse Rate:  [81] 81 (10/27 1025) Resp:  [16] 16 (10/27 1025) BP: (143)/(88) 143/88 (10/27 1025) SpO2:  [99 %] 99 % (10/27 1025) Weight:  [78.7 kg] 78.7 kg (10/27 1043) Constitutional:  Alert and oriented, No acute distress Cardiovascular: Regular rate and rhythm Respiratory: Normal respiratory effort, Lungs clear bilaterally GI: Abdomen is soft, nontender, nondistended, no abdominal masses GU: No CVA tenderness Lymphatic: No lymphadenopathy Neurologic: Grossly intact, no focal deficits Psychiatric: Normal mood and affect  Laboratory Data:  Recent Labs    01/11/24 1100  WBC 6.1  HGB 12.1*  HCT 38.5*  PLT 264    No results for input(s): NA, K, CL, GLUCOSE, BUN, CALCIUM , CREATININE in the last 72 hours.  Invalid input(s): CO3   Results for orders placed or performed during the hospital encounter of 01/11/24 (from the past 24 hours)  CBC     Status: Abnormal   Collection Time: 01/11/24 11:00 AM  Result Value Ref Range   WBC 6.1 4.0 - 10.5 K/uL   RBC 4.28 4.22 - 5.81 MIL/uL   Hemoglobin 12.1 (L) 13.0 - 17.0 g/dL   HCT 61.4 (L) 60.9 - 47.9 %   MCV 90.0 80.0 - 100.0 fL   MCH 28.3 26.0 - 34.0 pg   MCHC 31.4 30.0 - 36.0 g/dL   RDW 86.3 88.4 -  84.4 %   Platelets 264 150 - 400 K/uL   nRBC 0.0 0.0 - 0.2 %   No results found for this or any previous visit (from the past 240 hours).  Renal Function: Recent Labs    01/05/24 1134  CREATININE 0.91   Estimated Creatinine Clearance: 63.7 mL/min (by C-G formula based on SCr of 0.91 mg/dL).  Radiologic Imaging: No results found.  I independently reviewed the above imaging studies.  Assessment and Plan Andrew Moran is a 78 y.o. male with large left renal stones here for staged left URS/LL.  -The risks, benefits and alternatives of cystoscopy with L URS/LL, left JJ stent placement was discussed with the patient.  Risks include, but are not limited to: bleeding, urinary tract infection, ureteral injury, ureteral stricture disease, chronic pain, urinary symptoms, bladder injury, stent migration, the need for nephrostomy tube placement, MI, CVA, DVT, PE and the inherent risks with general anesthesia.  The patient voices understanding and wishes to proceed.     Matt R. Daylah Sayavong MD 01/11/2024, 11:30 AM  [facility] Pager: [phone]): [phone]

## 2024-01-11 NOTE — Op Note (Signed)
 Operative Note  Preoperative diagnosis:  1.  Left renal stones  Postoperative diagnosis: 1.  Left renal stones  Procedure(s): 1.  Cystoscopy 2. Left ureteroscopy with laser lithotripsy and basket extraction of stones 3. Left retrograde pyelogram 4. Left ureteral stent placement 5. Fluoroscopy with intraoperative interpretation  Surgeon: Donnice Siad, MD  Assistants:  None  Anesthesia:  General  Complications:  None  EBL:  Minimal  Specimens: 1. None  Drains/Catheters: 1.  Left 6Fr x 26cm ureteral stent without a tether string  Intraoperative findings:   Cystoscopy demonstrated slightly narrowed penile and bulbar urethra, passable with scope, nonobstructing prostate, no suspicious bladder lesions. Left retrograde pyelogram with no hydronephrosis, no extravasation of contrast and no filling defects. Left ureteroscopy demonstrated approximate 11 mm left renal pelvis stone, 1.5 cm left lower pole stone and 7 mm left upper pole stone ED she is actually dusted. Successful left ureteral stent placement.  Indication:  Andrew Moran is a 78 y.o. male with several large left renal stones here for for staged left ureteroscopy with laser lithotripsy and basket extraction stone.  Description of procedure: After informed consent was obtained from the patient, the patient was identified and taken to the operating room and placed in the supine position.  General anesthesia was administered as well as perioperative IV antibiotics.  At the beginning of the case, a time-out was performed to properly identify the patient, the surgery to be performed, and the surgical site.  Sequential compression devices were applied to the lower extremities at the beginning of the case for DVT prophylaxis.  The patient was then placed in the dorsal lithotomy supine position, prepped and draped in sterile fashion.  We then passed the 21-French rigid cystoscope through the urethra and into the bladder under  vision without any difficulty, noting a slightly narrowed penile and bulbar urethra and a mildly obstructing prostate.  A systematic evaluation of the bladder revealed no evidence of any suspicious bladder lesions.  Ureteral orifices were in normal position.    Under cystoscopic and flouroscopic guidance, we cannulated the left ureteral orifice with a 5-French open-ended ureteral catheter and a gentle retrograde pyelogram was performed, revealing a normal caliber ureter without any filling defects. There was no hydronephrosis of the collecting system. A 0.038 sensor wire was then passed up to the level of the renal pelvis and secured to the drape as a safety wire. The ureteral catheter and cystoscope were removed, leaving the safety wire in place.   A semi-rigid ureteroscope was passed alongside the wire up the distal ureter which appeared normal. A second 0.038 sensor wire was passed under direct vision and the semirigid scope was removed. The flexible ureteroscope was advanced into the collecting system. The collecting system was inspected. The calculus was identified at the renal pelvis, upper pole and lower pole. Using the 272 micron holmium laser fiber, the stones were dusted completely. With the ureteroscope in the kidney, a gentle pyelogram was performed to delineate the calyceal system and we evaluated the calyces systematically. We encountered some small fragments and given the debris present, elected to allow passage of fragments and return for second stage ureteroscopy as scheduled.   We then withdrew the ureteroscope back down the ureter along, noting no evidence of any stones along the course of the ureter.  Prior to removing the ureteroscope, we did pass the Glidewire back up to the ureter to the renal pelvis.    Once the ureteroscope was removed, the Glidewire was backloaded through  the rigid cystoscope, which was then advanced down the urethra and into the bladder. We then used the Glidewire  under direct vision through the rigid cystoscope and under fluoroscopic guidance and passed up a 6-French, 26 cm double-pigtail ureteral stent up ureter, making sure that the proximal and distal ends coiled within the kidney and bladder respectively.  I did not leave a tether string.  We were able to see the distal stent coiling nicely within the bladder.  The bladder was then emptied with irrigation solution.  The cystoscope was then removed.    The patient tolerated the procedure well and there was no complication. Patient was awoken from anesthesia and taken to the recovery room in stable condition. I was present and scrubbed for the entirety of the case.  Plan:  Patient will be discharged home.  He will return scheduled for second stage ureteroscopy with laser lithotripsy.  Matt R. Shayana Hornstein MD [facility]  Pager: [phone]

## 2024-01-11 NOTE — Anesthesia Postprocedure Evaluation (Signed)
 Anesthesia Post Note  Patient: Andrew Moran  Procedure(s) Performed: CYSTOSCOPY/URETEROSCOPY/HOLMIUM LASER/STENT PLACEMENT (Left)     Patient location during evaluation: PACU Anesthesia Type: General Level of consciousness: awake and alert and oriented Pain management: pain level controlled Vital Signs Assessment: post-procedure vital signs reviewed and stable Respiratory status: spontaneous breathing, nonlabored ventilation and respiratory function stable Cardiovascular status: blood pressure returned to baseline and stable Postop Assessment: no apparent nausea or vomiting Anesthetic complications: no   No notable events documented.  Last Vitals:  Vitals:   01/11/24 1500 01/11/24 1515  BP: (!) 153/90 (!) 152/95  Pulse: 80 81  Resp: 14 14  Temp: 36.7 C 36.6 C  SpO2: 98% 96%    Last Pain:  Vitals:   01/11/24 1515  TempSrc: Oral  PainSc: 0-No pain                 Andrew Moran A.

## 2024-01-11 NOTE — Discharge Instructions (Signed)
 [facility] [phone] Post Ureteroscopy With or Without Stent Instructions  Definitions:  Ureter: The duct that transports urine from the kidney to the bladder. Stent:   A plastic hollow tube that is placed into the ureter, from the kidney to the bladder to prevent the ureter from swelling shut.  GENERAL INSTRUCTIONS:  Despite the fact that no skin incisions were used, the area around the ureter and bladder is raw and irritated. The stent is a foreign body which will further irritate the bladder wall. This irritation is manifested by increased frequency of urination, both day and night, and by an increase in the urge to urinate. In some, the urge to urinate is present almost always. Sometimes the urge is strong enough that you may not be able to stop yourself from urinating. The only real cure is to remove the stent and then give time for the bladder wall to heal which can't be done until the danger of the ureter swelling shut has passed, which varies.  You may see some blood in your urine while the stent is in place and a few days afterwards. Do not be alarmed, even if the urine was clear for a while. Get off your feet and drink lots of fluids until clearing occurs. If you start to pass clots or don't improve, call us .  DIET: You may return to your normal diet immediately. Because of the raw surface of your bladder, alcohol, spicy foods, acid type foods and drinks with caffeine may cause irritation or frequency and should be used in moderation. To keep your urine flowing freely and to avoid constipation, drink plenty of fluids during the day ( 8-10 glasses ). Tip: Avoid cranberry juice because it is very acidic.  ACTIVITY: Your physical activity doesn't need to be restricted. However, if you are very active, you may see some blood in your urine. We suggest that you reduce your activity under these circumstances until the bleeding has stopped.  BOWELS: It is important to  keep your bowels regular during the postoperative period. Straining with bowel movements can cause bleeding. A bowel movement every other day is reasonable. Use a mild laxative if needed, such as Milk of Magnesia 2-3 tablespoons, or 2 Dulcolax tablets. Call if you continue to have problems. If you have been taking narcotics for pain, before, during or after your surgery, you may be constipated. Take a laxative if necessary.   MEDICATION: You should resume your pre-surgery medications unless told not to. In addition you will often be given an antibiotic to prevent infection. These should be taken as prescribed until the bottles are finished unless you are having an unusual reaction to one of the drugs.  PROBLEMS YOU SHOULD REPORT TO US : Fevers over 100.5 Fahrenheit. Heavy bleeding, or clots ( See above notes about blood in urine ). Inability to urinate. Drug reactions ( hives, rash, nausea, vomiting, diarrhea ). Severe burning or pain with urination that is not improving.  FOLLOW-UP: You will need a follow-up appointment to monitor your progress. Call for this appointment at the number listed above. Usually the first appointment will be about three to fourteen days after your surgery.  You have a stent draining your left kidney and this remain in place until your next procedure.

## 2024-01-11 NOTE — Anesthesia Procedure Notes (Signed)
 Procedure Name: LMA Insertion Date/Time: 01/11/2024 1:38 PM  Performed by: Nanci Riis, CRNAPre-anesthesia Checklist: Patient identified, Emergency Drugs available, Suction available, Patient being monitored and Timeout performed Patient Re-evaluated:Patient Re-evaluated prior to induction Oxygen Delivery Method: Circle system utilized Preoxygenation: Pre-oxygenation with 100% oxygen Induction Type: IV induction Ventilation: Mask ventilation without difficulty LMA: LMA inserted LMA Size: 4.0 Number of attempts: 1 Placement Confirmation: positive ETCO2 and breath sounds checked- equal and bilateral Tube secured with: Tape Dental Injury: Teeth and Oropharynx as per pre-operative assessment

## 2024-01-11 NOTE — Transfer of Care (Signed)
 Immediate Anesthesia Transfer of Care Note  Patient: Andrew Moran  Procedure(s) Performed: CYSTOSCOPY/URETEROSCOPY/HOLMIUM LASER/STENT PLACEMENT (Left)  Patient Location: PACU  Anesthesia Type:General  Level of Consciousness: drowsy and patient cooperative  Airway & Oxygen Therapy: Patient Spontanous Breathing  Post-op Assessment: Report given to RN and Post -op Vital signs reviewed and stable  Post vital signs: Reviewed and stable  Last Vitals:  Vitals Value Taken Time  BP    Temp    Pulse    Resp    SpO2      Last Pain:  Vitals:   01/11/24 1043  TempSrc:   PainSc: 0-No pain         Complications: No notable events documented.

## 2024-01-12 ENCOUNTER — Encounter (HOSPITAL_COMMUNITY): Payer: Self-pay | Admitting: Urology

## 2024-01-13 ENCOUNTER — Ambulatory Visit: Attending: Cardiology | Admitting: Internal Medicine

## 2024-01-13 VITALS — BP 110/54 | HR 83 | Ht 65.0 in | Wt 176.8 lb

## 2024-01-13 DIAGNOSIS — N200 Calculus of kidney: Secondary | ICD-10-CM

## 2024-01-13 DIAGNOSIS — Z79899 Other long term (current) drug therapy: Secondary | ICD-10-CM

## 2024-01-13 DIAGNOSIS — I2699 Other pulmonary embolism without acute cor pulmonale: Secondary | ICD-10-CM

## 2024-01-13 DIAGNOSIS — R600 Localized edema: Secondary | ICD-10-CM | POA: Diagnosis not present

## 2024-01-13 DIAGNOSIS — R319 Hematuria, unspecified: Secondary | ICD-10-CM

## 2024-01-13 DIAGNOSIS — N202 Calculus of kidney with calculus of ureter: Secondary | ICD-10-CM | POA: Diagnosis not present

## 2024-01-13 DIAGNOSIS — Z96652 Presence of left artificial knee joint: Secondary | ICD-10-CM | POA: Diagnosis not present

## 2024-01-13 DIAGNOSIS — R350 Frequency of micturition: Secondary | ICD-10-CM | POA: Diagnosis not present

## 2024-01-13 LAB — FACTOR 5 LEIDEN

## 2024-01-13 MED ORDER — ENOXAPARIN SODIUM 80 MG/0.8ML IJ SOSY: PREFILLED_SYRINGE | INTRAMUSCULAR | 0 refills | Status: DC

## 2024-01-13 NOTE — Patient Instructions (Addendum)
 Medication Instructions:  Please inject  Lovenox 80 mg as:  Inject contents of one syringe the morning and evening of 11/8 and once in the morning of 11/9 (in preparation for procedure on 01/25/2024)  Lab Work: None  Follow-Up: At [facility], you and your health needs are our priority.  As part of our continuing mission to provide you with exceptional heart care, our providers are all part of one team.  This team includes your primary Cardiologist (physician) and Advanced Practice Providers or APPs (Physician Assistants and Nurse Practitioners) who all work together to provide you with the care you need, when you need it.  Your next appointment:   1 year(s)  Provider:   Soyla DELENA Merck, MD or Available APP (Nurse Practitioner or Physician's Assistant) if MD does not have availability  Other Instructions Please call us  or send a MyChart message with any Cardiology related questions/concerns.  [phone].  Thank you!

## 2024-01-13 NOTE — Progress Notes (Signed)
 Cardiology Office Note:  .   Date:  01/13/2024  ID:  Andrew Moran, DOB [birth year], MRN [id number] PCP: Kip Righter, MD  [facility] Providers Cardiologist:  Soyla DELENA Merck, MD    History of Present Illness: .   Andrew Moran is a 78 y.o. male.  Discussed the use of AI scribe software for clinical note transcription with the patient, who gave verbal consent to proceed.  History of Present Illness Andrew Moran is a 78 year old male who presents for follow-up after a recent kidney stone procedure and management of blood thinners.  He is recovering from a recent kidney stone procedure and is scheduled for another in two weeks. He experienced bleeding after starting Xarelto  post-ER visit for PE. He resumed Xarelto  one day after the procedure, having been on Lovenox in the interim.  He has a history of pulmonary embolism diagnosed in September 2025, with previous blood clots in his legs in 2021. He is currently managed on Xarelto  for the pulmonary embolism. Breathing is stable, and shoulder discomfort has resolved.  He experiences lightheadedness, described as dizziness and faintness, particularly when changing positions. This symptom has been present since a fall in April, resulting in a concussion. He is not on any blood pressure medications.  He reports hematuria since starting Xarelto , with urine appearing mostly blood and some clots noted after the procedure.    ROS: negative except per HPI above.  Studies Reviewed: .        Results RADIOLOGY Pulmonary embolism identified (11/28/2023) Leg ultrasound: No deep vein thrombosis (11/30/2023)  DIAGNOSTIC Echocardiogram: Normal ventricular function, normal right heart function Risk Assessment/Calculations:       Physical Exam:   VS:  BP (!) 110/54 (BP Location: Left Arm, Patient Position: Sitting, Cuff Size: Large)   Pulse 83   Ht 5' 5 (1.651 m)   Wt 176 lb 12.8 oz (80.2 kg)   SpO2 97%   BMI  29.42 kg/m    Wt Readings from Last 3 Encounters:  01/13/24 176 lb 12.8 oz (80.2 kg)  01/11/24 173 lb 8 oz (78.7 kg)  01/07/24 173 lb 9.6 oz (78.7 kg)     Physical Exam GENERAL: Alert, cooperative, well developed, no acute distress. HEENT: Normocephalic, normal oropharynx, moist mucous membranes. CHEST: Clear to auscultation bilaterally, no wheezes, rhonchi, or crackles. CARDIOVASCULAR: Normal heart rate and rhythm, S1 and S2 normal without murmurs. ABDOMEN: Soft, non-tender, non-distended, without organomegaly, normal bowel sounds. EXTREMITIES: Mild bilateral ankle edema, no cyanosis. NEUROLOGICAL: Cranial nerves grossly intact, moves all extremities without gross motor or sensory deficit.   ASSESSMENT AND PLAN: .    Assessment and Plan Assessment & Plan Pulmonary embolism Pulmonary embolism managed with Xarelto . Discussed potential lifelong anticoagulation due to recurrence. Hematology consultation suggested for hypercoagulable state evaluation. - Continue Xarelto  20 mg daily as prescribed. - Coordinate with PCCM for anticoagulation management. - Consult hematology for hypercoagulable state evaluation. - Call if new dyspnea or chest pain occurs.  Hematuria associated with anticoagulation and nephrolithiasis Hematuria post-Xarelto  initiation linked to nephrolithiasis. Urologist managing with ureteroscopy and laser lithotripsy.  - Coordinate with urologist for nephrolithiasis and bleeding management. - Resume Xarelto  post-procedure as advised by urologist.  Nephrolithiasis status post ureteroscopy Underwent ureteroscopy with laser lithotripsy. Scheduled for second procedure in two weeks. Urologist managing condition. - Proceed with scheduled second ureteroscopy in two weeks. - Coordinate with urologist for pre-procedure and post-procedure management. - Administer Lovenox prior to procedure as previously done for bridging  anticoagulation with current PE.  Orthostatic  lightheadedness, likely multifactorial Orthostatic lightheadedness possibly due to hypotension and medication changes. Persistent symptoms post-concussion. Blood pressure lower than usual. Discussed medication effects on blood pressure. - Monitor blood pressure regularly, especially on symptomatic days. - Hydrate well and consider electrolyte supplementation for symptoms  Chronic bilateral ankle edema post-knee replacement Chronic bilateral ankle edema likely due to past knee replacement and venous insufficiency. Mild swelling consistent with history. - Consider wearing compression socks if swelling becomes bothersome. No current DVT identified.  Recording duration: 23 minutes      Rahima Fleishman, MD, FACC

## 2024-01-20 ENCOUNTER — Encounter (HOSPITAL_COMMUNITY): Payer: Self-pay | Admitting: Urology

## 2024-01-20 ENCOUNTER — Other Ambulatory Visit: Payer: Self-pay

## 2024-01-20 NOTE — Progress Notes (Signed)
 Sent message, via epic in basket, requesting orders in epic from Careers adviser.

## 2024-01-20 NOTE — Progress Notes (Addendum)
 For Anesthesia: PCP - Kip Righter, MD  Cardiologist - Loni Soyla LABOR, MD office visit note 01/13/24   Pulmonologist- Neda Jennet LABOR, MD  office visit note 11/30/23 in [facility]  Bowel Prep reminder: N/A  Chest x-ray - 11/28/23 in [facility] EKG - 11/28/23 in [facility] Stress Test - 05/29/20 in [facility] ECHO - 10/12/22 in [facility] Cardiac Cath - N/A Pacemaker/ICD device last checked: N/A Pacemaker orders received: N/A Device Rep notified:  N/A  Spinal Cord Stimulator: N/A  Sleep Study - N/A CPAP - N/A  Fasting Blood Sugar - N/A Checks Blood Sugar __N/A___ times a day Date and result of last Hgb A1c-N/A  Last dose of GLP1 agonist- N/A GLP1 instructions: Hold 7 days prior to schedule (Hold 24 hours-daily)   Last dose of SGLT-2 inhibitors- N/A SGLT-2 instructions: Hold 72 hours prior to surgery  Blood Thinner Instructions: Xarelto  Administer Lovenox Saturday Morning and Evening and last dose Sunday Morning for bridging anticoagulation  Last Dose: 01/21/24 Time last taken: 9AM   Aspirin Instructions: N/A Last Dose:N/A Time last taken: N/A  Activity level: Can go up a flight of stairs and activities of daily living without stopping and without chest pain and/or shortness of breath  Anesthesia review: He has a history of pulmonary embolism diagnosed in September 2025, with previous blood clots in his legs in 2021   Patient denies shortness of breath, fever, cough and chest pain at PAT appointment   Patient verbalized understanding of instructions that were reviewed over the telephone.

## 2024-01-25 ENCOUNTER — Ambulatory Visit (HOSPITAL_COMMUNITY)

## 2024-01-25 ENCOUNTER — Encounter (HOSPITAL_COMMUNITY): Payer: Self-pay | Admitting: Urology

## 2024-01-25 ENCOUNTER — Other Ambulatory Visit: Payer: Self-pay

## 2024-01-25 ENCOUNTER — Ambulatory Visit (HOSPITAL_BASED_OUTPATIENT_CLINIC_OR_DEPARTMENT_OTHER)

## 2024-01-25 ENCOUNTER — Ambulatory Visit (HOSPITAL_COMMUNITY)
Admission: RE | Admit: 2024-01-25 | Discharge: 2024-01-25 | Disposition: A | Discharge status: Home / Self Care | Attending: Urology | Admitting: Urology

## 2024-01-25 ENCOUNTER — Encounter (HOSPITAL_COMMUNITY)
Admission: RE | Disposition: A | Discharge status: Home / Self Care | Payer: Self-pay | Source: Home / Self Care | Attending: Urology

## 2024-01-25 DIAGNOSIS — N201 Calculus of ureter: Secondary | ICD-10-CM | POA: Insufficient documentation

## 2024-01-25 DIAGNOSIS — Z7901 Long term (current) use of anticoagulants: Secondary | ICD-10-CM

## 2024-01-25 DIAGNOSIS — Z86711 Personal history of pulmonary embolism: Secondary | ICD-10-CM | POA: Insufficient documentation

## 2024-01-25 DIAGNOSIS — Z01818 Encounter for other preprocedural examination: Secondary | ICD-10-CM

## 2024-01-25 DIAGNOSIS — K219 Gastro-esophageal reflux disease without esophagitis: Secondary | ICD-10-CM | POA: Insufficient documentation

## 2024-01-25 DIAGNOSIS — M1990 Unspecified osteoarthritis, unspecified site: Secondary | ICD-10-CM | POA: Insufficient documentation

## 2024-01-25 DIAGNOSIS — J45909 Unspecified asthma, uncomplicated: Secondary | ICD-10-CM | POA: Diagnosis not present

## 2024-01-25 HISTORY — DX: Hematuria, unspecified: R31.9

## 2024-01-25 HISTORY — PX: CYSTOSCOPY/URETEROSCOPY/HOLMIUM LASER/STENT PLACEMENT: SHX6546

## 2024-01-25 HISTORY — DX: Presence of external hearing-aid: Z97.4

## 2024-01-25 HISTORY — DX: Unspecified hearing loss, unspecified ear: H91.90

## 2024-01-25 LAB — BASIC METABOLIC PANEL WITH GFR
Anion gap: 13 (ref 5–15)
BUN: 19 mg/dL (ref 8–23)
CO2: 21 mmol/L — ABNORMAL LOW (ref 22–32)
Calcium: 9.1 mg/dL (ref 8.9–10.3)
Chloride: 107 mmol/L (ref 98–111)
Creatinine, Ser: 1.47 mg/dL — ABNORMAL HIGH (ref 0.61–1.24)
GFR, Estimated: 48 mL/min — ABNORMAL LOW (ref >60)
Glucose, Bld: 101 mg/dL — ABNORMAL HIGH (ref 70–99)
Potassium: 4.5 mmol/L (ref 3.5–5.1)
Sodium: 141 mmol/L (ref 135–145)

## 2024-01-25 LAB — CBC
HCT: 33.1 % — ABNORMAL LOW (ref 39.0–52.0)
Hemoglobin: 10.7 g/dL — ABNORMAL LOW (ref 13.0–17.0)
MCH: 29.3 pg (ref 26.0–34.0)
MCHC: 32.3 g/dL (ref 30.0–36.0)
MCV: 90.7 fL (ref 80.0–100.0)
Platelets: 311 K/uL (ref 150–400)
RBC: 3.65 MIL/uL — ABNORMAL LOW (ref 4.22–5.81)
RDW: 14 % (ref 11.5–15.5)
WBC: 6.2 K/uL (ref 4.0–10.5)
nRBC: 0 % (ref 0.0–0.2)

## 2024-01-25 LAB — PROTIME-INR
INR: 1.1 (ref 0.8–1.2)
Prothrombin Time: 14.6 s (ref 11.4–15.2)

## 2024-01-25 SURGERY — CYSTOSCOPY/URETEROSCOPY/HOLMIUM LASER/STENT PLACEMENT
Anesthesia: General | Laterality: Left

## 2024-01-25 MED ORDER — LIDOCAINE 2% (20 MG/ML) 5 ML SYRINGE
INTRAMUSCULAR | Status: DC | PRN
  Administered 2024-01-25: 100 mg via INTRAVENOUS

## 2024-01-25 MED ORDER — AMISULPRIDE (ANTIEMETIC) 5 MG/2ML IV SOLN: 10.0000 mg | Freq: Once | INTRAVENOUS | Status: DC | PRN

## 2024-01-25 MED ORDER — PHENYLEPHRINE HCL (PRESSORS) 10 MG/ML IV SOLN
INTRAVENOUS | Status: DC | PRN
  Administered 2024-01-25: 160 ug via INTRAVENOUS
  Administered 2024-01-25: 80 ug via INTRAVENOUS
  Administered 2024-01-25: 200 ug via INTRAVENOUS
  Administered 2024-01-25: 80 ug via INTRAVENOUS
  Administered 2024-01-25: 120 ug via INTRAVENOUS
  Administered 2024-01-25 (×2): 80 ug via INTRAVENOUS

## 2024-01-25 MED ORDER — SODIUM CHLORIDE 0.9 % IR SOLN
Status: DC | PRN
  Administered 2024-01-25: 3000 mL via INTRAVESICAL

## 2024-01-25 MED ORDER — PROPOFOL 10 MG/ML IV BOLUS
INTRAVENOUS | Status: AC
  Filled 2024-01-25: qty 20

## 2024-01-25 MED ORDER — ONDANSETRON HCL 4 MG/2ML IJ SOLN
INTRAMUSCULAR | Status: AC
  Filled 2024-01-25: qty 4

## 2024-01-25 MED ORDER — PROPOFOL 10 MG/ML IV BOLUS
INTRAVENOUS | Status: DC | PRN
Start: 2024-01-25 — End: 2024-01-25
  Administered 2024-01-25: 50 mg via INTRAVENOUS
  Administered 2024-01-25: 30 mg via INTRAVENOUS
  Administered 2024-01-25: 80 mg via INTRAVENOUS
  Administered 2024-01-25: 40 mg via INTRAVENOUS
  Administered 2024-01-25: 50 mg via INTRAVENOUS

## 2024-01-25 MED ORDER — CHLORHEXIDINE GLUCONATE 0.12 % MT SOLN
15.0000 mL | Freq: Once | OROMUCOSAL | Status: AC
  Administered 2024-01-25: 15 mL via OROMUCOSAL

## 2024-01-25 MED ORDER — ONDANSETRON HCL 4 MG/2ML IJ SOLN
INTRAMUSCULAR | Status: DC | PRN
  Administered 2024-01-25: 4 mg via INTRAVENOUS

## 2024-01-25 MED ORDER — LIDOCAINE HCL (PF) 2 % IJ SOLN
INTRAMUSCULAR | Status: AC
  Filled 2024-01-25: qty 5

## 2024-01-25 MED ORDER — LACTATED RINGERS IV SOLN: INTRAVENOUS | Status: DC

## 2024-01-25 MED ORDER — OXYCODONE HCL 5 MG PO TABS: 5.0000 mg | ORAL_TABLET | Freq: Once | ORAL | Status: DC | PRN

## 2024-01-25 MED ORDER — ONDANSETRON HCL 4 MG/2ML IJ SOLN
INTRAMUSCULAR | Status: AC
  Filled 2024-01-25: qty 2

## 2024-01-25 MED ORDER — FENTANYL CITRATE (PF) 100 MCG/2ML IJ SOLN
INTRAMUSCULAR | Status: AC
  Filled 2024-01-25: qty 2

## 2024-01-25 MED ORDER — ONDANSETRON HCL 4 MG/2ML IJ SOLN: 4.0000 mg | Freq: Once | INTRAMUSCULAR | Status: DC | PRN

## 2024-01-25 MED ORDER — FENTANYL CITRATE (PF) 250 MCG/5ML IJ SOLN
INTRAMUSCULAR | Status: DC | PRN
  Administered 2024-01-25: 100 ug via INTRAVENOUS
  Administered 2024-01-25 (×2): 50 ug via INTRAVENOUS

## 2024-01-25 MED ORDER — CEFAZOLIN SODIUM-DEXTROSE 2-3 GM-%(50ML) IV SOLR
INTRAVENOUS | Status: DC | PRN
  Administered 2024-01-25: 2 g via INTRAVENOUS

## 2024-01-25 MED ORDER — FENTANYL CITRATE (PF) 50 MCG/ML IJ SOSY: 25.0000 ug | PREFILLED_SYRINGE | INTRAMUSCULAR | Status: DC | PRN

## 2024-01-25 MED ORDER — ORAL CARE MOUTH RINSE: 15.0000 mL | Freq: Once | OROMUCOSAL | Status: AC

## 2024-01-25 MED ORDER — ACETAMINOPHEN 10 MG/ML IV SOLN: 1000.0000 mg | Freq: Once | INTRAVENOUS | Status: DC | PRN

## 2024-01-25 MED ORDER — IOHEXOL 300 MG/ML  SOLN
INTRAMUSCULAR | Status: DC | PRN
Start: 2024-01-25 — End: 2024-01-25
  Administered 2024-01-25: 5 mL

## 2024-01-25 MED ORDER — DEXAMETHASONE SOD PHOSPHATE PF 10 MG/ML IJ SOLN
INTRAMUSCULAR | Status: DC | PRN
  Administered 2024-01-25: 10 mg via INTRAVENOUS

## 2024-01-25 MED ORDER — OXYCODONE HCL 5 MG/5ML PO SOLN: 5.0000 mg | Freq: Once | ORAL | Status: DC | PRN

## 2024-01-25 SURGICAL SUPPLY — 22 items
BAG URO CATCHER STRL LF (MISCELLANEOUS) ×1 IMPLANT
BASKET ZERO TIP NITINOL 2.4FR (BASKET) IMPLANT
BENZOIN TINCTURE PRP APPL 2/3 (GAUZE/BANDAGES/DRESSINGS) IMPLANT
CATH URETERAL DUAL LUMEN 10F (MISCELLANEOUS) IMPLANT
CATH URETL OPEN END 6FR 70 (CATHETERS) IMPLANT
CLOTH BEACON ORANGE TIMEOUT ST (SAFETY) ×1 IMPLANT
DRSG TEGADERM 2-3/8X2-3/4 SM (GAUZE/BANDAGES/DRESSINGS) IMPLANT
FIBER LASER MOSES 200 DFL (Laser) IMPLANT
GLOVE BIOGEL M 7.0 STRL (GLOVE) ×1 IMPLANT
GOWN STRL REUS W/ TWL XL LVL3 (GOWN DISPOSABLE) ×1 IMPLANT
GUIDEWIRE STR DUAL SENSOR (WIRE) ×2 IMPLANT
GUIDEWIRE ZIPWRE .038 STRAIGHT (WIRE) IMPLANT
KIT TURNOVER KIT A (KITS) ×1 IMPLANT
MANIFOLD NEPTUNE II (INSTRUMENTS) ×1 IMPLANT
PACK CYSTO (CUSTOM PROCEDURE TRAY) ×1 IMPLANT
PAD PREP 24X48 CUFFED NSTRL (MISCELLANEOUS) ×1 IMPLANT
SHEATH DILATOR SET 8/10 (MISCELLANEOUS) IMPLANT
SHEATH NAVIGATOR HD 11/13X28 (SHEATH) IMPLANT
SHEATH NAVIGATOR HD 11/13X36 (SHEATH) IMPLANT
STENT URET 6FRX26 CONTOUR (STENTS) IMPLANT
TUBING CONNECTING 10 (TUBING) ×1 IMPLANT
TUBING UROLOGY SET (TUBING) ×1 IMPLANT

## 2024-01-25 NOTE — Anesthesia Preprocedure Evaluation (Addendum)
 Anesthesia Evaluation  Patient identified by MRN, date of birth, ID band Patient awake    Reviewed: Allergy  & Precautions, NPO status , Patient's Chart, lab work & pertinent test results  History of Anesthesia Complications Negative for: history of anesthetic complications  Airway Mallampati: II       Dental  (+) Teeth Intact, Dental Advisory Given, Chipped   Pulmonary asthma , PE   breath sounds clear to auscultation       Cardiovascular  Rhythm:Regular Rate:Normal  Echo 09/22/22 1. Left ventricular ejection fraction, by estimation, is 60 to 65%. The  left ventricle has normal function. The left ventricle has no regional  wall motion abnormalities. There is mild concentric left ventricular  hypertrophy. Left ventricular diastolic parameters are consistent with Grade I diastolic dysfunction (impaired relaxation).   2. Right ventricular systolic function is normal. The right ventricular  size is normal. There is normal pulmonary artery systolic pressure.   3. The mitral valve is normal in structure. Trivial mitral valve  regurgitation. No evidence of mitral stenosis.   4. The aortic valve is tricuspid. Aortic valve regurgitation is mild. No  aortic stenosis is present.   5. The inferior vena cava is normal in size with greater than 50%  respiratory variability, suggesting right atrial pressure of 3 mmHg.    EKG 11/28/23 Sinus rhythm Right axis deviation Low voltage, precordial leads    Neuro/Psych neg Seizures    GI/Hepatic ,GERD  Medicated and Controlled,,  Endo/Other  neg diabetes    Renal/GU Renal InsufficiencyRenal disease     Musculoskeletal  (+) Arthritis ,    Abdominal   Peds  Hematology   Anesthesia Other Findings Kidney Stone  Reproductive/Obstetrics                              Anesthesia Physical Anesthesia Plan  ASA: 3  Anesthesia Plan: General   Post-op Pain  Management:    Induction: Intravenous  PONV Risk Score and Plan: 2 and Ondansetron , Dexamethasone  and Treatment may vary due to age or medical condition  Airway Management Planned: Oral ETT  Additional Equipment:   Intra-op Plan:   Post-operative Plan: Extubation in OR  Informed Consent:      Dental advisory given  Plan Discussed with: CRNA  Anesthesia Plan Comments:          Anesthesia Quick Evaluation

## 2024-01-25 NOTE — Discharge Instructions (Signed)
 [facility] [phone] Post Ureteroscopy With or Without Stent Instructions  Definitions:  Ureter: The duct that transports urine from the kidney to the bladder. Stent:   A plastic hollow tube that is placed into the ureter, from the kidney to the bladder to prevent the ureter from swelling shut.  GENERAL INSTRUCTIONS:  Despite the fact that no skin incisions were used, the area around the ureter and bladder is raw and irritated. The stent is a foreign body which will further irritate the bladder wall. This irritation is manifested by increased frequency of urination, both day and night, and by an increase in the urge to urinate. In some, the urge to urinate is present almost always. Sometimes the urge is strong enough that you may not be able to stop yourself from urinating. The only real cure is to remove the stent and then give time for the bladder wall to heal which can't be done until the danger of the ureter swelling shut has passed, which varies.  You may see some blood in your urine while the stent is in place and a few days afterwards. Do not be alarmed, even if the urine was clear for a while. Get off your feet and drink lots of fluids until clearing occurs. If you start to pass clots or don't improve, call us .  DIET: You may return to your normal diet immediately. Because of the raw surface of your bladder, alcohol, spicy foods, acid type foods and drinks with caffeine may cause irritation or frequency and should be used in moderation. To keep your urine flowing freely and to avoid constipation, drink plenty of fluids during the day ( 8-10 glasses ). Tip: Avoid cranberry juice because it is very acidic.  ACTIVITY: Your physical activity doesn't need to be restricted. However, if you are very active, you may see some blood in your urine. We suggest that you reduce your activity under these circumstances until the bleeding has stopped.  BOWELS: It is important to  keep your bowels regular during the postoperative period. Straining with bowel movements can cause bleeding. A bowel movement every other day is reasonable. Use a mild laxative if needed, such as Milk of Magnesia 2-3 tablespoons, or 2 Dulcolax tablets. Call if you continue to have problems. If you have been taking narcotics for pain, before, during or after your surgery, you may be constipated. Take a laxative if necessary.   MEDICATION: You should resume your pre-surgery medications unless told not to. In addition you will often be given an antibiotic to prevent infection. These should be taken as prescribed until the bottles are finished unless you are having an unusual reaction to one of the drugs.  PROBLEMS YOU SHOULD REPORT TO US : Fevers over 100.5 Fahrenheit. Heavy bleeding, or clots ( See above notes about blood in urine ). Inability to urinate. Drug reactions ( hives, rash, nausea, vomiting, diarrhea ). Severe burning or pain with urination that is not improving.  FOLLOW-UP: You will need a follow-up appointment to monitor your progress. Call for this appointment at the number listed above. Usually the first appointment will be about three to fourteen days after your surgery.  You have a stent in place and may remove on Thursday AM by pulling on attached string.

## 2024-01-25 NOTE — H&P (Signed)
 Urology Preoperative H&P   Chief Complaint: Left renal stones  History of Present Illness: Andrew Moran is a 78 y.o. male with left renal stones here for second stage left URS/LL/L stent placement. Denies fevers, chills, dysuria.  Past Medical History:  Diagnosis Date   Arthritis    Asthma    pulmology-- dr wert--- cough varient versus uacs   Concussion 06/2023   GERD (gastroesophageal reflux disease)    per pt takes DGL supplement   Hematuria    History of diverticulitis of colon 2002   managed medically , no surgical intervention   History of DVT of lower extremity 06/27/2019   followed by dr g. loni--  completed 3 months xarelto  07/ 2021   History of kidney stones    HOH (hard of hearing)    Pulmonary embolism (HCC) 11/28/2023   Renal calculi    left side   Wears hearing aid     Past Surgical History:  Procedure Laterality Date   ANKLE HARDWARE REMOVAL Right 2008   CARPAL TUNNEL RELEASE Bilateral left 1983;  right 1984   CYSTOSCOPY WITH RETROGRADE PYELOGRAM, URETEROSCOPY AND STENT PLACEMENT Left 02/23/2020   Procedure: CYSTOSCOPY WITH RETROGRADE PYELOGRAM, URETEROSCOPY, LITHOTRIPSY, STONE EXTRACTION,  AND STENT PLACEMENT;  Surgeon: Matilda Senior, MD;  Location: [facility];  Service: Urology;  Laterality: Left;   CYSTOSCOPY/RETROGRADE/URETEROSCOPY/STONE EXTRACTION WITH BASKET Bilateral 10/10/2013   Procedure: CYSTOSCOPY/URETEROSCOPY/STONE EXTRACTION WITH HOLMIUM LASER, bilateral retrograde, bilateral ureteral stents;  Surgeon: Senior CHRISTELLA Matilda, MD;  Location: [facility];  Service: Urology;  Laterality: Bilateral;   CYSTOSCOPY/URETEROSCOPY/HOLMIUM LASER/STENT PLACEMENT Left 01/11/2024   Procedure: CYSTOSCOPY/URETEROSCOPY/HOLMIUM LASER/STENT PLACEMENT;  Surgeon: Selma Donnice SAUNDERS, MD;  Location: [facility];  Service: Urology;  Laterality: Left;  FIRST STAGE   EXPLORATORY LAPAROTOMY  1970   EXPLORATORY STOMACH SURGERY FOR TEAR IN STOMACH WALL   EXTRACORPOREAL  SHOCK WAVE LITHOTRIPSY Left 10/06/2019   Procedure: EXTRACORPOREAL SHOCK WAVE LITHOTRIPSY (ESWL);  Surgeon: Elisabeth Valli BIRCH, MD;  Location: [facility];  Service: Urology;  Laterality: Left;   EXTRACORPOREAL SHOCK WAVE LITHOTRIPSY  multiple since 2002,  last one 02-28-2013   HOLMIUM LASER APPLICATION Left 10/10/2013   Procedure: HOLMIUM LASER APPLICATION;  Surgeon: Senior CHRISTELLA Matilda, MD;  Location: [facility];  Service: Urology;  Laterality: Left;   KNEE ARTHROSCOPY  02/05/2011   Procedure: ARTHROSCOPY KNEE;  Surgeon: Lynwood SQUIBB Apling[place];  Location: [facility];  Service: Orthopedics;  Laterality: Right;  Right knee arthroscopy partial lateral and medial meniscectomy with condyle debridement   LUMBAR SPINE SURGERY  x2  1995   ORIF ANKLE FRACTURE Right 1988   ROTATOR CUFF REPAIR Bilateral right 02/16/2018;  left 1997   TONSILLECTOMY AND ADENOIDECTOMY  1951   TOTAL KNEE ARTHROPLASTY Left 04/08/2019   Procedure: TOTAL KNEE ARTHROPLASTY;  Surgeon: Gerome Charleston, MD;  Location: [facility];  Service: Orthopedics;  Laterality: Left;  with adductor canal    Allergies:  Allergies  Allergen Reactions   Zithromax [Azithromycin] Shortness Of Breath   Betadine  [Povidone Iodine ] Itching    Iodine  drape, if left on too long   Povidone-Iodine  Itching    povidone-iodine    Wound Dressing Adhesive Other (See Comments)    Irritation     Family History  Problem Relation Age of Onset   Heart attack Father     Social History:  reports that he has never smoked. He has never been exposed to tobacco smoke. He has never used smokeless tobacco. He reports current alcohol use of  about 1.0 standard drink of alcohol per week. He reports that he does not use drugs.  ROS: A complete review of systems was performed.  All systems are negative except for pertinent findings as noted.  Physical Exam:  Vital signs in last 24 hours: Temp:  [98.5 F (36.9 C)] 98.5 F (36.9 C) (11/10 1137) Pulse Rate:  [75] 75  (11/10 1137) Resp:  [16] 16 (11/10 1137) BP: (127)/(67) 127/67 (11/10 1137) SpO2:  [98 %] 98 % (11/10 1137) Weight:  [78 kg] 78 kg (11/10 1210) Constitutional:  Alert and oriented, No acute distress Cardiovascular: Regular rate and rhythm Respiratory: Normal respiratory effort, Lungs clear bilaterally GI: Abdomen is soft, nontender, nondistended, no abdominal masses GU: No CVA tenderness Lymphatic: No lymphadenopathy Neurologic: Grossly intact, no focal deficits Psychiatric: Normal mood and affect  Laboratory Data:  Recent Labs    01/25/24 1230  WBC 6.2  HGB 10.7*  HCT 33.1*  PLT 311    No results for input(s): NA, K, CL, GLUCOSE, BUN, CALCIUM , CREATININE in the last 72 hours.  Invalid input(s): CO3   Results for orders placed or performed during the hospital encounter of 01/25/24 (from the past 24 hours)  CBC per protocol     Status: Abnormal   Collection Time: 01/25/24 12:30 PM  Result Value Ref Range   WBC 6.2 4.0 - 10.5 K/uL   RBC 3.65 (L) 4.22 - 5.81 MIL/uL   Hemoglobin 10.7 (L) 13.0 - 17.0 g/dL   HCT 66.8 (L) 60.9 - 47.9 %   MCV 90.7 80.0 - 100.0 fL   MCH 29.3 26.0 - 34.0 pg   MCHC 32.3 30.0 - 36.0 g/dL   RDW 85.9 88.4 - 84.4 %   Platelets 311 150 - 400 K/uL   nRBC 0.0 0.0 - 0.2 %   No results found for this or any previous visit (from the past 240 hours).  Renal Function: No results for input(s): CREATININE in the last 168 hours. Estimated Creatinine Clearance: 68.7 mL/min (by C-G formula based on SCr of 0.84 mg/dL).  Radiologic Imaging: No results found.  I independently reviewed the above imaging studies.  Assessment and Plan DORA CLAUSS is a 78 y.o. male with left renal stones here for second stage L URS/LL, stent placement.   Matt R. Wilhelm Ganaway MD 01/25/2024, 12:50 PM  [facility] Pager: [phone]): [phone]

## 2024-01-25 NOTE — Op Note (Signed)
 Operative Note   Preoperative diagnosis:  1.  Left renal stones   Postoperative diagnosis: 1.  Left renal stones   Procedure(s): 1.  Cystoscopy 2. Second stage left ureteroscopy with laser lithotripsy and basket extraction of stones 3. Left retrograde pyelogram 4. Left ureteral stent placement 5. Fluoroscopy with intraoperative interpretation   Surgeon: Donnice Siad, MD   Assistants:  None   Anesthesia:  General   Complications:  None   EBL:  Minimal   Specimens: 1. None   Drains/Catheters: 1.  Left 6Fr x 26cm ureteral stent with a tether string   Intraoperative findings:   Cystoscopy demonstrated slightly narrowed penile and bulbar urethra, passable with scope, nonobstructing prostate, no suspicious bladder lesions. Left retrograde pyelogram with no hydronephrosis, no extravasation of contrast and no filling defects. Left ureteroscopy demonstrated left upper, interpolar and lower pole fragments that were all basket extracted.  Remaining pieces were just dusted and smaller than typical laser fiber. Successful left ureteral stent exchange.   Indication:  Andrew Moran is a 78 y.o. male with several large left renal stones here for for staged left ureteroscopy with laser lithotripsy and basket extraction stone.   Description of procedure: After informed consent was obtained from the patient, the patient was identified and taken to the operating room and placed in the supine position.  General anesthesia was administered as well as perioperative IV antibiotics.  At the beginning of the case, a time-out was performed to properly identify the patient, the surgery to be performed, and the surgical site.  Sequential compression devices were applied to the lower extremities at the beginning of the case for DVT prophylaxis.  The patient was then placed in the dorsal lithotomy supine position, prepped and draped in sterile fashion.   We then passed the 21-French rigid cystoscope  through the urethra and into the bladder under vision without any difficulty, noting a slightly narrowed penile and bulbar urethra and a mildly obstructing prostate.  A systematic evaluation of the bladder revealed no evidence of any suspicious bladder lesions.  Ureteral orifices were in normal position.     Under cystoscopic and flouroscopic guidance, we cannulated the left ureteral orifice with a 5-French open-ended ureteral catheter and a gentle retrograde pyelogram was performed, revealing a normal caliber ureter without any filling defects. There was no hydronephrosis of the collecting system. A 0.038 sensor wire was then passed up to the level of the renal pelvis and secured to the drape as a safety wire. The ureteral catheter and cystoscope were removed, leaving the safety wire in place.    A semi-rigid ureteroscope was passed alongside the wire up the distal ureter which appeared normal.  I removed 3 small stones in the proximal ureter each about 4 mm with ZeroTip basket. A second 0.038 sensor wire was passed under direct vision and the semirigid scope was removed. The flexible ureteroscope was advanced into the collecting system. The collecting system was inspected.  The stones were identified at the renal pelvis, upper pole and lower pole. Using the 272 micron holmium laser fiber, the larger stones are fragmented.  I then removed all stones with ZeroTip basket.  Remaining pieces were smaller than tip of laser fiber. With the ureteroscope in the kidney, a gentle pyelogram was performed to delineate the calyceal system and we evaluated the calyces systematically.  We encountered no further larger stones.  All the remaining pieces were smaller than typical laser fiber.   We then withdrew the ureteroscope back down  the ureter along, noting no evidence of any stones along the course of the ureter and no evidence of trauma in the ureter.  He did have some concentric rings in the ureter that did not make  passing scope up and down slightly difficult. Prior to removing the ureteroscope, we did pass the Glidewire back up to the ureter to the renal pelvis.     Once the ureteroscope was removed, the Glidewire was backloaded through the rigid cystoscope, which was then advanced down the urethra and into the bladder. We then used the Glidewire under direct vision through the rigid cystoscope and under fluoroscopic guidance and passed up a 6-French, 26 cm double-pigtail ureteral stent up ureter, making sure that the proximal and distal ends coiled within the kidney and bladder respectively.  I left a tether string on the stent.   We were able to see the distal stent coiling nicely within the bladder.  The bladder was then emptied with irrigation solution.  The cystoscope was then removed.     The patient tolerated the procedure well and there was no complication. Patient was awoken from anesthesia and taken to the recovery room in stable condition. I was present and scrubbed for the entirety of the case.   Plan:  Patient will be discharged home and will remove stent in 3 days.  He will follow with me as scheduled in 1 month with ultrasound prior.   Matt R. Lorenza Winkleman MD [facility]  Pager: [phone]

## 2024-01-25 NOTE — Anesthesia Procedure Notes (Signed)
 Procedure Name: LMA Insertion Date/Time: 01/25/2024 3:19 PM  Performed by: Nanci Riis, CRNAPre-anesthesia Checklist: Patient identified, Emergency Drugs available, Suction available, Patient being monitored and Timeout performed Patient Re-evaluated:Patient Re-evaluated prior to induction Oxygen Delivery Method: Circle system utilized Preoxygenation: Pre-oxygenation with 100% oxygen Induction Type: IV induction LMA: LMA inserted LMA Size: 4.0 Number of attempts: 1 Placement Confirmation: positive ETCO2 and breath sounds checked- equal and bilateral Tube secured with: Tape Dental Injury: Teeth and Oropharynx as per pre-operative assessment

## 2024-01-25 NOTE — Transfer of Care (Signed)
 Immediate Anesthesia Transfer of Care Note  Patient: Andrew Moran  Procedure(s) Performed: CYSTOSCOPY/URETEROSCOPY/HOLMIUM LASER/STENT PLACEMENT (Left)  Patient Location: PACU  Anesthesia Type:General  Level of Consciousness: awake and patient cooperative  Airway & Oxygen Therapy: Patient Spontanous Breathing  Post-op Assessment: Report given to RN and Post -op Vital signs reviewed and stable  Post vital signs: Reviewed and stable  Last Vitals:  Vitals Value Taken Time  BP 126/74 01/25/24 16:30  Temp    Pulse 78 01/25/24 16:33  Resp 17 01/25/24 16:33  SpO2 98 % 01/25/24 16:33  Vitals shown include unfiled device data.  Last Pain:  Vitals:   01/25/24 1210  TempSrc:   PainSc: 7       Patients Stated Pain Goal: 5 (01/25/24 1210)  Complications: No notable events documented.

## 2024-01-26 ENCOUNTER — Encounter: Payer: Self-pay | Admitting: Hematology and Oncology

## 2024-01-26 ENCOUNTER — Encounter (HOSPITAL_COMMUNITY): Payer: Self-pay | Admitting: Urology

## 2024-01-26 NOTE — Anesthesia Postprocedure Evaluation (Signed)
 Anesthesia Post Note  Patient: ROGERICK BALDWIN  Procedure(s) Performed: CYSTOSCOPY/URETEROSCOPY/HOLMIUM LASER/STENT PLACEMENT (Left)     Patient location during evaluation: PACU Anesthesia Type: General Level of consciousness: awake Pain management: pain level controlled Vital Signs Assessment: post-procedure vital signs reviewed and stable Respiratory status: spontaneous breathing and nonlabored ventilation Cardiovascular status: blood pressure returned to baseline Postop Assessment: no apparent nausea or vomiting Anesthetic complications: no   No notable events documented.  Last Vitals:  Vitals:   01/25/24 1707 01/25/24 1708  BP: 120/72 119/79  Pulse: 86 84  Resp: 15 20  Temp: (!) 36.1 C (!) 36.1 C  SpO2: 99% 100%    Last Pain:  Vitals:   01/25/24 1708  TempSrc: Temporal  PainSc: 0-No pain                 Lauraine KATHEE [place]

## 2024-02-16 ENCOUNTER — Inpatient Hospital Stay: Admitting: Hematology and Oncology

## 2024-02-16 ENCOUNTER — Inpatient Hospital Stay: Attending: Hematology and Oncology

## 2024-02-16 ENCOUNTER — Other Ambulatory Visit: Payer: Self-pay | Admitting: Hematology and Oncology

## 2024-02-16 VITALS — BP 126/64 | HR 68 | Temp 98.1°F | Resp 18 | Wt 176.6 lb

## 2024-02-16 DIAGNOSIS — Z87442 Personal history of urinary calculi: Secondary | ICD-10-CM | POA: Insufficient documentation

## 2024-02-16 DIAGNOSIS — Z86718 Personal history of other venous thrombosis and embolism: Secondary | ICD-10-CM | POA: Diagnosis not present

## 2024-02-16 DIAGNOSIS — Z974 Presence of external hearing-aid: Secondary | ICD-10-CM | POA: Insufficient documentation

## 2024-02-16 DIAGNOSIS — J45909 Unspecified asthma, uncomplicated: Secondary | ICD-10-CM | POA: Diagnosis not present

## 2024-02-16 DIAGNOSIS — Z7901 Long term (current) use of anticoagulants: Secondary | ICD-10-CM | POA: Diagnosis not present

## 2024-02-16 DIAGNOSIS — Z79899 Other long term (current) drug therapy: Secondary | ICD-10-CM | POA: Diagnosis not present

## 2024-02-16 DIAGNOSIS — M129 Arthropathy, unspecified: Secondary | ICD-10-CM | POA: Diagnosis not present

## 2024-02-16 DIAGNOSIS — I82409 Acute embolism and thrombosis of unspecified deep veins of unspecified lower extremity: Secondary | ICD-10-CM | POA: Diagnosis not present

## 2024-02-16 DIAGNOSIS — Z7982 Long term (current) use of aspirin: Secondary | ICD-10-CM | POA: Diagnosis not present

## 2024-02-16 DIAGNOSIS — K219 Gastro-esophageal reflux disease without esophagitis: Secondary | ICD-10-CM | POA: Insufficient documentation

## 2024-02-16 DIAGNOSIS — Z86711 Personal history of pulmonary embolism: Secondary | ICD-10-CM | POA: Insufficient documentation

## 2024-02-16 LAB — CMP (CANCER CENTER ONLY)
ALT: 18 U/L (ref 0–44)
AST: 20 U/L (ref 15–41)
Albumin: 4.1 g/dL (ref 3.5–5.0)
Alkaline Phosphatase: 86 U/L (ref 38–126)
Anion gap: 7 (ref 5–15)
BUN: 17 mg/dL (ref 8–23)
CO2: 27 mmol/L (ref 22–32)
Calcium: 9.2 mg/dL (ref 8.9–10.3)
Chloride: 109 mmol/L (ref 98–111)
Creatinine: 0.91 mg/dL (ref 0.61–1.24)
GFR, Estimated: >60 mL/min (ref >60)
Glucose, Bld: 89 mg/dL (ref 70–99)
Potassium: 4.7 mmol/L (ref 3.5–5.1)
Sodium: 142 mmol/L (ref 135–145)
Total Bilirubin: 0.4 mg/dL (ref 0.0–1.2)
Total Protein: 6.5 g/dL (ref 6.5–8.1)

## 2024-02-16 LAB — CBC WITH DIFFERENTIAL (CANCER CENTER ONLY)
Abs Immature Granulocytes: 0.02 K/uL (ref 0.00–0.07)
Basophils Absolute: 0 K/uL (ref 0.0–0.1)
Basophils Relative: 1 %
Eosinophils Absolute: 0.3 K/uL (ref 0.0–0.5)
Eosinophils Relative: 6 %
HCT: 35.2 % — ABNORMAL LOW (ref 39.0–52.0)
Hemoglobin: 11.6 g/dL — ABNORMAL LOW (ref 13.0–17.0)
Immature Granulocytes: 0 %
Lymphocytes Relative: 21 %
Lymphs Abs: 1.2 K/uL (ref 0.7–4.0)
MCH: 29.2 pg (ref 26.0–34.0)
MCHC: 33 g/dL (ref 30.0–36.0)
MCV: 88.7 fL (ref 80.0–100.0)
Monocytes Absolute: 0.4 K/uL (ref 0.1–1.0)
Monocytes Relative: 7 %
Neutro Abs: 3.7 K/uL (ref 1.7–7.7)
Neutrophils Relative %: 65 %
Platelet Count: 246 K/uL (ref 150–400)
RBC: 3.97 MIL/uL — ABNORMAL LOW (ref 4.22–5.81)
RDW: 14.4 % (ref 11.5–15.5)
WBC Count: 5.7 K/uL (ref 4.0–10.5)
nRBC: 0 % (ref 0.0–0.2)

## 2024-02-16 NOTE — Progress Notes (Signed)
 [facility] [phone]   [phone]  PROGRESS NOTE  Patient Care Team: Kip Righter, MD as PCP - General (Family Medicine) Loni Soyla LABOR, MD as PCP - Cardiology (Cardiology)  Hematological/Oncological History # Recurrent Provoked VTEs  11/28/2023: CT PE study showed bilateral upper lobe pulmonary emboli with no evidence of heart strain 01/07/2024: establish care with Dr. Federico   Interval History:  Andrew Moran 78 y.o. male with medical history significant for recurrent VTEs who presents for a follow up visit. The patient's last visit was on 01/07/2024. In the interim since the last visit he has had no major changes in his health.  He reports that he switched over to the Lovenox  for his procedures and that went well.  He did have some bleeding after the procedures but none subsequently.  He is tolerating a Xarelto  20 mg p.o. daily well with no major side effects.  He does not have any signs or symptoms concerning for recurrent VTE such as leg pain, chest pain, or shortness of breath.  He reports he is not having any overt bleeding but does have some easy bruising.  He reports that he would like to get off of the Xarelto  therapy after discussion of the risks and benefits moving forward.  Details of her conversation are noted below.  A full 10 point ROS is otherwise negative.  MEDICAL HISTORY:  Past Medical History:  Diagnosis Date   Arthritis    Asthma    pulmology-- dr wert--- cough varient versus uacs   Concussion 06/2023   GERD (gastroesophageal reflux disease)    per pt takes DGL supplement   Hematuria    History of diverticulitis of colon 2002   managed medically , no surgical intervention   History of DVT of lower extremity 06/27/2019   followed by dr g. loni--  completed 3 months xarelto  07/ 2021   History of kidney stones    HOH (hard of hearing)    Pulmonary embolism (HCC) 11/28/2023   Renal calculi    left side   Wears  hearing aid     SURGICAL HISTORY: Past Surgical History:  Procedure Laterality Date   ANKLE HARDWARE REMOVAL Right 2008   CARPAL TUNNEL RELEASE Bilateral left 1983;  right 1984   CYSTOSCOPY WITH RETROGRADE PYELOGRAM, URETEROSCOPY AND STENT PLACEMENT Left 02/23/2020   Procedure: CYSTOSCOPY WITH RETROGRADE PYELOGRAM, URETEROSCOPY, LITHOTRIPSY, STONE EXTRACTION,  AND STENT PLACEMENT;  Surgeon: Matilda Senior, MD;  Location: [facility];  Service: Urology;  Laterality: Left;   CYSTOSCOPY/RETROGRADE/URETEROSCOPY/STONE EXTRACTION WITH BASKET Bilateral 10/10/2013   Procedure: CYSTOSCOPY/URETEROSCOPY/STONE EXTRACTION WITH HOLMIUM LASER, bilateral retrograde, bilateral ureteral stents;  Surgeon: Senior CHRISTELLA Matilda, MD;  Location: [facility];  Service: Urology;  Laterality: Bilateral;   CYSTOSCOPY/URETEROSCOPY/HOLMIUM LASER/STENT PLACEMENT Left 01/11/2024   Procedure: CYSTOSCOPY/URETEROSCOPY/HOLMIUM LASER/STENT PLACEMENT;  Surgeon: Selma Donnice SAUNDERS, MD;  Location: [facility];  Service: Urology;  Laterality: Left;  FIRST STAGE   CYSTOSCOPY/URETEROSCOPY/HOLMIUM LASER/STENT PLACEMENT Left 01/25/2024   Procedure: CYSTOSCOPY/URETEROSCOPY/HOLMIUM LASER/STENT PLACEMENT;  Surgeon: Selma Donnice SAUNDERS, MD;  Location: [facility];  Service: Urology;  Laterality: Left;  SECOND STAGE   EXPLORATORY LAPAROTOMY  1970   EXPLORATORY STOMACH SURGERY FOR TEAR IN STOMACH WALL   EXTRACORPOREAL SHOCK WAVE LITHOTRIPSY Left 10/06/2019   Procedure: EXTRACORPOREAL SHOCK WAVE LITHOTRIPSY (ESWL);  Surgeon: Elisabeth Valli BIRCH, MD;  Location: [facility];  Service: Urology;  Laterality: Left;   EXTRACORPOREAL SHOCK WAVE LITHOTRIPSY  multiple since 2002,  last one 02-28-2013  HOLMIUM LASER APPLICATION Left 10/10/2013   Procedure: HOLMIUM LASER APPLICATION;  Surgeon: Garnette CHRISTELLA Shack, MD;  Location: [facility];  Service: Urology;  Laterality: Left;   KNEE ARTHROSCOPY  02/05/2011   Procedure: ARTHROSCOPY KNEE;  Surgeon:  Lynwood SQUIBB Aplington;  Location: [facility];  Service: Orthopedics;  Laterality: Right;  Right knee arthroscopy partial lateral and medial meniscectomy with condyle debridement   LUMBAR SPINE SURGERY  x2  1995   ORIF ANKLE FRACTURE Right 1988   ROTATOR CUFF REPAIR Bilateral right 02/16/2018;  left 1997   TONSILLECTOMY AND ADENOIDECTOMY  1951   TOTAL KNEE ARTHROPLASTY Left 04/08/2019   Procedure: TOTAL KNEE ARTHROPLASTY;  Surgeon: Gerome Charleston, MD;  Location: [facility];  Service: Orthopedics;  Laterality: Left;  with adductor canal    SOCIAL HISTORY: Social History   Socioeconomic History   Marital status: Married    Spouse name: Not on file   Number of children: Not on file   Years of education: Not on file   Highest education level: Not on file  Occupational History   Not on file  Tobacco Use   Smoking status: Never    Passive exposure: Never   Smokeless tobacco: Never  Vaping Use   Vaping status: Never Used  Substance and Sexual Activity   Alcohol use: Yes    Alcohol/week: 1.0 standard drink of alcohol    Types: 1 Glasses of wine per week    Comment: OCCASIONAL   Drug use: No   Sexual activity: Yes  Other Topics Concern   Not on file  Social History Narrative   Not on file   Social Drivers of Health   Financial Resource Strain: Not on file  Food Insecurity: No Food Insecurity (01/07/2024)   Hunger Vital Sign    Worried About Running Out of Food in the Last Year: Never true    Ran Out of Food in the Last Year: Never true  Transportation Needs: No Transportation Needs (01/07/2024)   PRAPARE - Administrator, Civil Service (Medical): No    Lack of Transportation (Non-Medical): No  Physical Activity: Not on file  Stress: Not on file  Social Connections: Socially Integrated (08/18/2023)   Social Connection and Isolation Panel    Frequency of Communication with Friends and Family: Twice a week    Frequency of Social Gatherings with Friends and Family: Once a week     Attends Religious Services: 1 to 4 times per year    Active Member of [employer] or Organizations: Yes    Attends Engineer, Structural: More than 4 times per year    Marital Status: Married  Catering Manager Violence: Not At Risk (01/07/2024)   Humiliation, Afraid, Rape, and Kick questionnaire    Fear of Current or Ex-Partner: No    Emotionally Abused: No    Physically Abused: No    Sexually Abused: No    FAMILY HISTORY: Family History  Problem Relation Age of Onset   Heart attack Father     ALLERGIES:  is allergic to zithromax [azithromycin], betadine  [povidone iodine ], povidone-iodine , and wound dressing adhesive.  MEDICATIONS:  Current Outpatient Medications  Medication Sig Dispense Refill   acetaminophen  (TYLENOL ) 500 MG tablet Take 500-1,000 mg by mouth every 6 (six) hours as needed for moderate pain (pain score 4-6).     albuterol  (VENTOLIN  HFA) 108 (90 Base) MCG/ACT inhaler Inhale 2 puffs into the lungs every 6 (six) hours as needed for wheezing or shortness of breath.  atorvastatin  (LIPITOR) 20 MG tablet Take 20 mg by mouth daily.     budesonide -glycopyrrolate-formoterol  (BREZTRI  AEROSPHERE) 160-9-4.8 MCG/ACT AERO inhaler Inhale 2 puffs into the lungs in the morning and at bedtime. 1 each 5   Multiple Vitamins-Minerals (MULTIVITAMINS THER. W/MINERALS) TABS Take 1 tablet by mouth daily.     XARELTO  20 MG TABS tablet Take 20 mg by mouth daily.     No current facility-administered medications for this visit.    REVIEW OF SYSTEMS:   Constitutional: ( - ) fevers, ( - )  chills , ( - ) night sweats Eyes: ( - ) blurriness of vision, ( - ) double vision, ( - ) watery eyes Ears, nose, mouth, throat, and face: ( - ) mucositis, ( - ) sore throat Respiratory: ( - ) cough, ( - ) dyspnea, ( - ) wheezes Cardiovascular: ( - ) palpitation, ( - ) chest discomfort, ( - ) lower extremity swelling Gastrointestinal:  ( - ) nausea, ( - ) heartburn, ( - ) change in bowel habits Skin:  ( - ) abnormal skin rashes Lymphatics: ( - ) new lymphadenopathy, ( - ) easy bruising Neurological: ( - ) numbness, ( - ) tingling, ( - ) new weaknesses Behavioral/Psych: ( - ) mood change, ( - ) new changes  All other systems were reviewed with the patient and are negative.  PHYSICAL EXAMINATION: Vitals:   02/16/24 1052  BP: 126/64  Pulse: 68  Resp: 18  Temp: 98.1 F (36.7 C)  SpO2: 100%   Filed Weights   02/16/24 1052  Weight: 176 lb 9.6 oz (80.1 kg)    GENERAL: Well-appearing elderly Caucasian male, alert, no distress and comfortable SKIN: skin color, texture, turgor are normal, no rashes or significant lesions EYES: conjunctiva are pink and non-injected, sclera clear LUNGS: clear to auscultation and percussion with normal breathing effort HEART: regular rate & rhythm and no murmurs and no lower extremity edema Musculoskeletal: no cyanosis of digits and no clubbing  PSYCH: alert & oriented x 3, fluent speech NEURO: no focal motor/sensory deficits  LABORATORY DATA:  I have reviewed the data as listed    Latest Ref Rng & Units 02/16/2024   10:26 AM 01/25/2024   12:30 PM 01/11/2024   11:00 AM  CBC  WBC 4.0 - 10.5 K/uL 5.7  6.2  6.1   Hemoglobin 13.0 - 17.0 g/dL 88.3  89.2  87.8   Hematocrit 39.0 - 52.0 % 35.2  33.1  38.5   Platelets 150 - 400 K/uL 246  311  264        Latest Ref Rng & Units 02/16/2024   10:26 AM 01/25/2024   12:30 PM 01/11/2024   11:00 AM  CMP  Glucose 70 - 99 mg/dL 89  898  98   BUN 8 - 23 mg/dL 17  19  13    Creatinine 0.61 - 1.24 mg/dL 9.08  8.52  9.15   Sodium 135 - 145 mmol/L 142  141  145   Potassium 3.5 - 5.1 mmol/L 4.7  4.5  4.3   Chloride 98 - 111 mmol/L 109  107  109   CO2 22 - 32 mmol/L 27  21  24    Calcium  8.9 - 10.3 mg/dL 9.2  9.1  9.3   Total Protein 6.5 - 8.1 g/dL 6.5     Total Bilirubin 0.0 - 1.2 mg/dL 0.4     Alkaline Phos 38 - 126 U/L 86     AST 15 -  41 U/L 20     ALT 0 - 44 U/L 18      RADIOGRAPHIC STUDIES: DG C-Arm  1-60 Min-No Report Result Date: 01/25/2024 Fluoroscopy was utilized by the requesting physician.  No radiographic interpretation.    ASSESSMENT & PLAN Andrew Moran 78 y.o. male with medical history significant for recurrent VTEs who presents for a follow up visit.   After review of the labs, review of the records, and discussion with the patient the patients findings are Moran consistent with recurrent provoked VTE's.   A provoked venous thromboembolism (VTE) is one that has a clear inciting factor or event. Provoking factors include prolonged travel/immobility, surgery (particularly abdominal or orthopedic), trauma,  and pregnancy/ estrogen containing birth control. This patient was reported to have prior surgeries, which would qualify as a transient provoking factor. As such we would recommend 3-6 months of anticoagulation therapy with consideration of additional therapy if symptoms persist. The anticoagulation therapy of choice in this situation is Xarelto . The patient has a supply of this medication and can afford it without difficulty. We will plan to see the patient back in 3 months time to reassess and assure they are doing well on treatment.    # Recurrent Provoked DVT/Pulmonary Embolism  --findings at this time are consistent with a provoked VTE  --labs today show white blood cell 5.7, hemoglobin 0.6, MCV 88.7, platelets 246 --recommend the patient continue xarelto  20mg  PO daily until he completes his current supply.  Then transition to 81 mg p.o. daily of aspirin. --patient denies any bleeding, bruising, or dark stools on this medication. It is well tolerated. No difficulties accessing/affording the medication  -- Technically the patient does not require lifelong anticoagulation as his clots are provoked, but given his proclivity for clotting would discussed risks and benefits of maintenance dose.  Patient noted he would like to proceed with baby aspirin alone. --RTC PRN     No  orders of the defined types were placed in this encounter.   All questions were answered. The patient knows to call the clinic with any problems, questions or concerns.  A total of more than 30 minutes were spent on this encounter with face-to-face time and non-face-to-face time, including preparing to see the patient, ordering tests and/or medications, counseling the patient and coordination of care as outlined above.   Norleen IVAR Kidney, MD Department of Hematology/Oncology [facility] Phone: [phone] Pager: [phone] Email: [email or web address]  02/16/2024 1:08 PM
